# Patient Record
Sex: Female | Born: 1935
Health system: Southern US, Community
[De-identification: ages and names within clinical notes are randomized; demographics above are authoritative.]

## PROBLEM LIST (undated history)

## (undated) DIAGNOSIS — Z79899 Other long term (current) drug therapy: Secondary | ICD-10-CM

## (undated) DIAGNOSIS — W19XXXA Unspecified fall, initial encounter: Secondary | ICD-10-CM

## (undated) DIAGNOSIS — D269 Other benign neoplasm of uterus, unspecified: Secondary | ICD-10-CM

## (undated) DIAGNOSIS — H9190 Unspecified hearing loss, unspecified ear: Secondary | ICD-10-CM

## (undated) DIAGNOSIS — N182 Chronic kidney disease, stage 2 (mild): Secondary | ICD-10-CM

## (undated) DIAGNOSIS — R5383 Other fatigue: Secondary | ICD-10-CM

## (undated) DIAGNOSIS — D649 Anemia, unspecified: Secondary | ICD-10-CM

## (undated) DIAGNOSIS — D692 Other nonthrombocytopenic purpura: Secondary | ICD-10-CM

## (undated) DIAGNOSIS — J438 Other emphysema: Secondary | ICD-10-CM

## (undated) DIAGNOSIS — R05 Cough: Secondary | ICD-10-CM

## (undated) DIAGNOSIS — R7989 Other specified abnormal findings of blood chemistry: Secondary | ICD-10-CM

## (undated) DIAGNOSIS — I059 Rheumatic mitral valve disease, unspecified: Secondary | ICD-10-CM

## (undated) DIAGNOSIS — R9431 Abnormal electrocardiogram [ECG] [EKG]: Secondary | ICD-10-CM

## (undated) DIAGNOSIS — R002 Palpitations: Secondary | ICD-10-CM

## (undated) DIAGNOSIS — J209 Acute bronchitis, unspecified: Secondary | ICD-10-CM

## (undated) DIAGNOSIS — M810 Age-related osteoporosis without current pathological fracture: Secondary | ICD-10-CM

## (undated) DIAGNOSIS — R5381 Other malaise: Secondary | ICD-10-CM

## (undated) DIAGNOSIS — H259 Unspecified age-related cataract: Secondary | ICD-10-CM

## (undated) DIAGNOSIS — H6120 Impacted cerumen, unspecified ear: Secondary | ICD-10-CM

## (undated) DIAGNOSIS — I10 Essential (primary) hypertension: Secondary | ICD-10-CM

## (undated) DIAGNOSIS — R42 Dizziness and giddiness: Secondary | ICD-10-CM

## (undated) DIAGNOSIS — M6740 Ganglion, unspecified site: Secondary | ICD-10-CM

## (undated) DIAGNOSIS — F0280 Dementia in other diseases classified elsewhere without behavioral disturbance: Secondary | ICD-10-CM

## (undated) DIAGNOSIS — E785 Hyperlipidemia, unspecified: Secondary | ICD-10-CM

## (undated) DIAGNOSIS — R011 Cardiac murmur, unspecified: Secondary | ICD-10-CM

## (undated) DIAGNOSIS — I259 Chronic ischemic heart disease, unspecified: Secondary | ICD-10-CM

## (undated) DIAGNOSIS — R059 Cough, unspecified: Secondary | ICD-10-CM

## (undated) DIAGNOSIS — D696 Thrombocytopenia, unspecified: Secondary | ICD-10-CM

## (undated) DIAGNOSIS — H3530 Unspecified macular degeneration: Secondary | ICD-10-CM

## (undated) HISTORY — DX: Unspecified hearing loss, unspecified ear: H91.90

## (undated) HISTORY — DX: Other nonthrombocytopenic purpura: D69.2

## (undated) HISTORY — DX: Ganglion, unspecified site: M67.40

## (undated) HISTORY — DX: Palpitations: R00.2

## (undated) HISTORY — DX: Dizziness and giddiness: R42

## (undated) HISTORY — DX: Cardiac murmur, unspecified: R01.1

## (undated) HISTORY — DX: Age-related osteoporosis without current pathological fracture: M81.0

## (undated) HISTORY — DX: Acute bronchitis, unspecified: J20.9

## (undated) HISTORY — DX: Other malaise: R53.83

## (undated) HISTORY — DX: Thrombocytopenia, unspecified: D69.6

## (undated) HISTORY — DX: Cough: R05

## (undated) HISTORY — DX: Unspecified age-related cataract: H25.9

## (undated) HISTORY — DX: Essential (primary) hypertension: I10

## (undated) HISTORY — DX: Impacted cerumen, unspecified ear: H61.20

## (undated) HISTORY — DX: Rheumatic mitral valve disease, unspecified: I05.9

## (undated) HISTORY — DX: Abnormal electrocardiogram (ECG) (EKG): R94.31

## (undated) HISTORY — DX: Anemia, unspecified: D64.9

## (undated) HISTORY — DX: Chronic kidney disease, stage 2 (mild): N18.2

## (undated) HISTORY — DX: Cough, unspecified: R05.9

## (undated) HISTORY — DX: Other specified abnormal findings of blood chemistry: R79.89

## (undated) HISTORY — DX: Dementia in other diseases classified elsewhere, unspecified severity, without behavioral disturbance, psychotic disturbance, mood disturbance, and anxiety: F02.80

## (undated) HISTORY — DX: Chronic ischemic heart disease, unspecified: I25.9

## (undated) HISTORY — DX: Hyperlipidemia, unspecified: E78.5

## (undated) HISTORY — DX: Unspecified macular degeneration: H35.30

## (undated) HISTORY — DX: Other malaise: R53.81

## (undated) HISTORY — PX: KNEE ARTHROSCOPY: SUR90

## (undated) HISTORY — DX: Unspecified fall, initial encounter: W19.XXXA

## (undated) HISTORY — DX: Other benign neoplasm of uterus, unspecified: D26.9

## (undated) HISTORY — DX: Other long term (current) drug therapy: Z79.899

## (undated) HISTORY — DX: Other emphysema: J43.8

---

## 1998-03-19 ENCOUNTER — Other Ambulatory Visit: Admission: RE | Admit: 1998-03-19 | Discharge: 1998-03-19 | Payer: Self-pay | Admitting: Obstetrics and Gynecology

## 1999-03-25 ENCOUNTER — Other Ambulatory Visit: Admission: RE | Admit: 1999-03-25 | Discharge: 1999-03-25 | Payer: Self-pay | Admitting: Obstetrics and Gynecology

## 2000-03-26 ENCOUNTER — Other Ambulatory Visit: Admission: RE | Admit: 2000-03-26 | Discharge: 2000-03-26 | Payer: Self-pay | Admitting: Obstetrics and Gynecology

## 2001-03-29 ENCOUNTER — Other Ambulatory Visit: Admission: RE | Admit: 2001-03-29 | Discharge: 2001-03-29 | Payer: Self-pay | Admitting: Obstetrics and Gynecology

## 2005-06-27 ENCOUNTER — Emergency Department (HOSPITAL_COMMUNITY): Admission: EM | Admit: 2005-06-27 | Discharge: 2005-06-27 | Payer: Self-pay | Admitting: Family Medicine

## 2008-10-10 ENCOUNTER — Encounter: Admission: RE | Admit: 2008-10-10 | Discharge: 2008-10-10 | Payer: Self-pay | Admitting: Internal Medicine

## 2010-02-12 ENCOUNTER — Encounter
Admission: RE | Admit: 2010-02-12 | Discharge: 2010-02-12 | Payer: Self-pay | Source: Home / Self Care | Attending: Internal Medicine | Admitting: Internal Medicine

## 2010-07-23 ENCOUNTER — Other Ambulatory Visit: Payer: Self-pay | Admitting: Internal Medicine

## 2010-07-23 ENCOUNTER — Ambulatory Visit
Admission: RE | Admit: 2010-07-23 | Discharge: 2010-07-23 | Disposition: A | Discharge status: Home / Self Care | Payer: BC Managed Care – PPO | Source: Ambulatory Visit | Attending: Internal Medicine | Admitting: Internal Medicine

## 2010-07-23 DIAGNOSIS — W19XXXA Unspecified fall, initial encounter: Secondary | ICD-10-CM

## 2010-07-23 DIAGNOSIS — R609 Edema, unspecified: Secondary | ICD-10-CM

## 2010-08-07 ENCOUNTER — Encounter (INDEPENDENT_AMBULATORY_CARE_PROVIDER_SITE_OTHER): Payer: Self-pay | Admitting: General Surgery

## 2010-08-07 ENCOUNTER — Ambulatory Visit (INDEPENDENT_AMBULATORY_CARE_PROVIDER_SITE_OTHER): Payer: BC Managed Care – PPO | Admitting: General Surgery

## 2010-08-07 VITALS — BP 160/98 | HR 72 | Temp 96.5°F | Ht 65.0 in | Wt 108.6 lb

## 2010-08-07 DIAGNOSIS — S5010XA Contusion of unspecified forearm, initial encounter: Secondary | ICD-10-CM | POA: Insufficient documentation

## 2010-08-07 NOTE — Progress Notes (Signed)
Subjective:     Patient ID: Lindsey Salazar, female   DOB: 1932/09/01, 77 y.o.   MRN: [id number]  HPI Patient is a 77 year old female who fell and struck her right arm and some mild surrounding 2 weeks ago. It did break the skin but did not have any significant wound. She developed a very large hematoma on the arm. It has been getting smaller but over the last few days has stalled. It has become softer. It does not hurt. She has had no fevers or chills. She did not strike any other location on her body. She has taken a little bit easier but has still been doing moderate housework.  Review of Systems  All other systems reviewed and are negative.   Past Medical History  Diagnosis Date  . Hyperlipidemia   . Cough   . Wears glasses   . Hearing loss     Past Surgical History  Procedure Date  . Knee arthroscopy     right    Current outpatient prescriptions:aspirin 81 MG tablet, Take 81 mg by mouth daily.  , Disp: , Rfl: ;  atorvastatin (LIPITOR) 10 MG tablet, Take 10 mg by mouth daily.  , Disp: , Rfl: ;  CALCIUM-VITAMIN D PO, Take by mouth 2 (two) times daily.  , Disp: , Rfl: ;  Multiple Vitamin (MULTIVITAMIN) capsule, Take 1 capsule by mouth daily.  , Disp: , Rfl: ;  Multiple Vitamins-Minerals (OCUVITE PO), Take by mouth daily.  , Disp: , Rfl:  Omega-3 Fatty Acids (FISH OIL PO), Take 1,200 mg by mouth 2 (two) times daily.  , Disp: , Rfl: ;  Polyethyl Glycol-Propyl Glycol (SYSTANE OP), Apply to eye daily.  , Disp: , Rfl:     Allergies  Allergen Reactions  . Penicillins     Family History  Problem Relation Age of Onset  . Stroke Brother   . Cancer Sister     lung    History   Social History  . Marital Status: Single    Spouse Name: N/A    Number of Children: N/A  . Years of Education: N/A   Occupational History  . Not on file.   Social History Main Topics  . Smoking status: Never Smoker   . Smokeless tobacco: Not on file  . Alcohol Use: No  . Drug Use: No  .  Sexually Active:    Other Topics Concern  . Not on file   Social History Narrative  . No narrative on file        Objective:   Physical Exam  Constitutional: She is oriented to person, place, and time. She appears well-developed and well-nourished. No distress.  HENT:  Head: Normocephalic.  Eyes: Conjunctivae are normal. Pupils are equal, round, and reactive to light. No scleral icterus.  Neck: Normal range of motion.  Cardiovascular: Normal rate.   Pulmonary/Chest: Effort normal. No respiratory distress.  Abdominal: Soft. There is no tenderness.  Musculoskeletal: Normal range of motion. She exhibits no tenderness.       R forearm hematoma around 5 cm over proximal brachioradialis.  Soft.  No erythema.  Neurological: She is alert and oriented to person, place, and time. Coordination normal.  Skin: Skin is warm and dry. No rash noted. She is not diaphoretic. No erythema. No pallor.  Psychiatric: She has a normal mood and affect. Her behavior is normal. Judgment and thought content normal.       Assessment:     R forearm hematoma  Plan:     Ice pack BID Wrap loosely with Ace bandage. Hold Aspirin and fish oil for 1-2 weeks. Expect hematoma to resolve on its own or to rupture spontaneously. Would not I&D as not infected, and open wound would likely take as long to heal as the hematoma. Will follow up as needed.

## 2010-11-01 LAB — HM DEXA SCAN

## 2010-11-08 ENCOUNTER — Encounter: Payer: Self-pay | Admitting: Cardiology

## 2011-03-19 ENCOUNTER — Encounter: Payer: Self-pay | Admitting: Cardiology

## 2011-04-15 ENCOUNTER — Ambulatory Visit (INDEPENDENT_AMBULATORY_CARE_PROVIDER_SITE_OTHER): Payer: BC Managed Care – PPO | Admitting: Cardiology

## 2011-04-15 ENCOUNTER — Encounter: Payer: Self-pay | Admitting: Cardiology

## 2011-04-15 VITALS — BP 165/100 | HR 66 | Ht 64.0 in | Wt 111.0 lb

## 2011-04-15 DIAGNOSIS — E785 Hyperlipidemia, unspecified: Secondary | ICD-10-CM

## 2011-04-15 DIAGNOSIS — I10 Essential (primary) hypertension: Secondary | ICD-10-CM

## 2011-04-15 DIAGNOSIS — R9431 Abnormal electrocardiogram [ECG] [EKG]: Secondary | ICD-10-CM

## 2011-04-15 LAB — BASIC METABOLIC PANEL
CO2: 26 mEq/L (ref 19–32)
Calcium: 9.8 mg/dL (ref 8.4–10.5)
Chloride: 103 mEq/L (ref 96–112)
Creatinine, Ser: 1.4 mg/dL — ABNORMAL HIGH (ref 0.4–1.2)
Potassium: 4.5 mEq/L (ref 3.5–5.1)
Sodium: 138 mEq/L (ref 135–145)

## 2011-04-15 LAB — CALCIUM: Calcium: 9.8 mg/dL (ref 8.4–10.5)

## 2011-04-15 NOTE — Assessment & Plan Note (Signed)
Her blood pressure is elevated today but she says this is very unusual. I repeated and verified this. She will keep her blood pressure diary which I reviewed. She will bring her blood pressure cuff to our office to compare readings. Further adjustments will be based on these results.

## 2011-04-15 NOTE — Progress Notes (Signed)
   HPI The patient presents for evaluation of an abnormal EKG. She has no prior cardiac history other than some vague chest discomfort years ago. She reports having had a stress test. She was well until late February when she had a fall in her blood pressure that was unexplained. This persisted for several days. She was weak and presyncopal. She did not describe associated chest discomfort, neck or arm discomfort. She did have some shortness of breath with activity but she wasn't having PND or orthopnea. Her Toprol was discontinued. She was noted at that time to have deep T-wave inversions in the anterolateral and inferior leads which was different compared to EKG in October of last year. She was set up for this appointment. However, her symptoms subsequently abated. She has since felt well. Her blood pressure has returned to. She denies any ongoing symptoms. The patient denies any new symptoms such as chest discomfort, neck or arm discomfort. There has been no new shortness of breath, PND or orthopnea. There have been no reported palpitations, presyncope or syncope.   Allergies  Allergen Reactions  . Penicillins     Current Outpatient Prescriptions  Medication Sig Dispense Refill  . atorvastatin (LIPITOR) 10 MG tablet Take 10 mg by mouth daily.        Marland Kitchen CALCIUM-VITAMIN D PO Take by mouth 2 (two) times daily.        . Multiple Vitamin (MULTIVITAMIN) capsule Take 1 capsule by mouth daily.        . Omega-3 Fatty Acids (FISH OIL PO) Take 1,200 mg by mouth 2 (two) times daily.        Marland Kitchen aspirin 81 MG tablet Take 81 mg by mouth daily.        Vladimir Faster Glycol-Propyl Glycol (SYSTANE OP) Apply to eye daily.          Past Medical History  Diagnosis Date  . Hyperlipidemia   . Cough   . Wears glasses   . Hearing loss     Past Surgical History  Procedure Date  . Knee arthroscopy     right    ROS:  As stated in the HPI and negative for all other systems.  PHYSICAL EXAM BP 165/100  Pulse 66   Ht 5\' 4"  (1.626 m)  Wt 111 lb (50.349 kg)  BMI 19.05 kg/m2 GENERAL:  Well appearing HEENT:  Pupils equal round and reactive, fundi not visualized, oral mucosa unremarkable NECK:  No jugular venous distention, waveform within normal limits, carotid upstroke brisk and symmetric, no bruits, no thyromegaly LYMPHATICS:  No cervical, inguinal adenopathy LUNGS:  Clear to auscultation bilaterally BACK:  No CVA tenderness CHEST:  Unremarkable HEART:  PMI not displaced or sustained,S1 and S2 within normal limits, no S3, no S4, no clicks, no rubs, no murmurs ABD:  Flat, positive bowel sounds normal in frequency in pitch, no bruits, no rebound, no guarding, no midline pulsatile mass, no hepatomegaly, no splenomegaly EXT:  2 plus pulses throughout, no edema, no cyanosis no clubbing SKIN:  No rashes no nodules NEURO:  Cranial nerves II through XII grossly intact, motor grossly intact throughout [facility]:  Cognitively intact, oriented to person place and time  EKG:  Sinus rhythm, rate 66, axis within normal limits, intervals within normal limits, deep T-wave inversions in the in [place] or leads II, III aVF and leads V3 through V6. 04/15/2011  ASSESSMENT AND PLAN

## 2011-04-15 NOTE — Assessment & Plan Note (Signed)
The patient has a markedly abnormal although she feels well.  I will check an echocardiogram to begin with.  Pending these results I will I on further ischemia workup.  I will check a magnesium, TSH, calcium and a basic metabolic profile.

## 2011-04-15 NOTE — Patient Instructions (Addendum)
Please have blood work today (MG, TSH, CALCIUM AND BMP)  The current medical regimen is effective;  continue present plan and medications.  Your physician has requested that you have an echocardiogram. Echocardiography is a painless test that uses sound waves to create images of your heart. It provides your doctor with information about the size and shape of your heart and how well your heart's chambers and valves are working. This procedure takes approximately one hour. There are no restrictions for this procedure.  Follow up in 2 months with Dr Percival Spanish.

## 2011-04-16 ENCOUNTER — Telehealth: Payer: Self-pay | Admitting: Cardiology

## 2011-04-16 NOTE — Telephone Encounter (Signed)
New Problem:    Patient called in wanting to know if she should start back on her blood pressure medication that she has at home.  Please call back.  She will leave her house around 10:30am and won't return until 2:00pm.

## 2011-04-16 NOTE — Telephone Encounter (Signed)
Left message for pt to keep BP diary and to not restart any medications at this time.  Requested she call back with further questions

## 2011-04-16 NOTE — Telephone Encounter (Signed)
Pt reported she was not taking BP medication at this time.  Dr Hochrein's orders were for her to keep a blood pressure diary and he will make adjustments based on these.  She is also to schedule a BP check and bring her BP cuff from home to compare.

## 2011-04-28 ENCOUNTER — Other Ambulatory Visit (HOSPITAL_COMMUNITY): Payer: BC Managed Care – PPO

## 2011-05-01 ENCOUNTER — Other Ambulatory Visit: Payer: Self-pay

## 2011-05-01 ENCOUNTER — Ambulatory Visit (HOSPITAL_COMMUNITY): Payer: MEDICARE | Attending: Cardiology

## 2011-05-01 DIAGNOSIS — I059 Rheumatic mitral valve disease, unspecified: Secondary | ICD-10-CM | POA: Insufficient documentation

## 2011-05-01 DIAGNOSIS — E785 Hyperlipidemia, unspecified: Secondary | ICD-10-CM | POA: Insufficient documentation

## 2011-05-01 DIAGNOSIS — I10 Essential (primary) hypertension: Secondary | ICD-10-CM | POA: Insufficient documentation

## 2011-05-01 DIAGNOSIS — R9431 Abnormal electrocardiogram [ECG] [EKG]: Secondary | ICD-10-CM | POA: Insufficient documentation

## 2011-05-13 ENCOUNTER — Other Ambulatory Visit: Payer: Self-pay | Admitting: *Deleted

## 2011-05-13 DIAGNOSIS — R9439 Abnormal result of other cardiovascular function study: Secondary | ICD-10-CM

## 2011-05-22 ENCOUNTER — Ambulatory Visit (HOSPITAL_COMMUNITY): Payer: MEDICARE | Attending: Cardiology | Admitting: Radiology

## 2011-05-22 VITALS — BP 153/88 | Ht 65.0 in | Wt 107.0 lb

## 2011-05-22 DIAGNOSIS — R000 Tachycardia, unspecified: Secondary | ICD-10-CM | POA: Insufficient documentation

## 2011-05-22 DIAGNOSIS — R9439 Abnormal result of other cardiovascular function study: Secondary | ICD-10-CM

## 2011-05-22 DIAGNOSIS — R0609 Other forms of dyspnea: Secondary | ICD-10-CM | POA: Insufficient documentation

## 2011-05-22 DIAGNOSIS — E785 Hyperlipidemia, unspecified: Secondary | ICD-10-CM | POA: Insufficient documentation

## 2011-05-22 DIAGNOSIS — R0989 Other specified symptoms and signs involving the circulatory and respiratory systems: Secondary | ICD-10-CM | POA: Insufficient documentation

## 2011-05-22 DIAGNOSIS — R9431 Abnormal electrocardiogram [ECG] [EKG]: Secondary | ICD-10-CM

## 2011-05-22 DIAGNOSIS — R0602 Shortness of breath: Secondary | ICD-10-CM

## 2011-05-22 MED ORDER — REGADENOSON 0.4 MG/5ML IV SOLN
0.4000 mg | Freq: Once | INTRAVENOUS | Status: AC
  Administered 2011-05-22: 0.4 mg via INTRAVENOUS

## 2011-05-22 MED ORDER — TECHNETIUM TC 99M TETROFOSMIN IV KIT
33.0000 | PACK | Freq: Once | INTRAVENOUS | Status: AC | PRN
  Administered 2011-05-22: 33 via INTRAVENOUS

## 2011-05-22 MED ORDER — TECHNETIUM TC 99M TETROFOSMIN IV KIT
11.0000 | PACK | Freq: Once | INTRAVENOUS | Status: AC | PRN
  Administered 2011-05-22: 11 via INTRAVENOUS

## 2011-05-22 NOTE — Progress Notes (Signed)
[place] [id number] [phone]  Cardiology Nuclear Med Study  Jeraldin Apple Lindsey Salazar is a 78 y.o. female     MRN : [id number]     DOB: 20-Jun-1932  Procedure Date: 05/22/2011  Nuclear Med Background Indication for Stress Test:  Evaluation for Ischemia and Abnormal EKG History: 4/13  Echo EF 55-60% Cardiac Risk Factors: Lipids  Symptoms:  DOE and Rapid HR   Nuclear Pre-Procedure Caffeine/Decaff Intake:  None> 12 hrs NPO After: 5:00pm   Lungs:  clear O2 Sat: 98% on room air. IV 0.9% NS with Angio Cath:  20g  IV Site: R Antecubital x 1, tolerated well IV Started by:  Irven Baltimore, RN  Chest Size (in):  34 Cup Size: A  Height: 5\' 5"  (1.651 m)  Weight:  107 lb (48.535 kg)  BMI:  Body mass index is 17.81 kg/(m^2). Tech Comments:  n/a    Nuclear Med Study 1 or 2 day study: 1 day  Stress Test Type:  Lexiscan  Reading MD: Darlin Coco, MD  Order Authorizing Provider:  Minus Breeding, MD  Resting Radionuclide: Technetium 54m Tetrofosmin  Resting Radionuclide Dose: 11.0 mCi   Stress Radionuclide:  Technetium 29m Tetrofosmin  Stress Radionuclide Dose: 33.0 mCi           Stress Protocol Rest HR: 58 Stress HR: 86  Rest BP: 153/88 Stress BP: 156/98  Exercise Time (min): n/a METS: n/a   Predicted Max HR: 144 bpm % Max HR: 59.72 bpm Rate Pressure Product: 13416   Dose of Adenosine (mg):  n/a Dose of Lexiscan: 0.4 mg  Dose of Atropine (mg): n/a Dose of Dobutamine: n/a mcg/kg/min (at max HR)  Stress Test Technologist: Crissie Figures, RN  Nuclear Technologist:  Charlton Amor, CNMT     Rest Procedure:  Myocardial perfusion imaging was performed at rest 45 minutes following the intravenous administration of Technetium 69m Tetrofosmin. Rest ECG: Sinus Bradycardia  Stress Procedure:  The patient received IV Lexiscan 0.4 mg over 15-seconds.  Technetium 72m Tetrofosmin injected at 30-seconds.  There were no significant  changes with Lexiscan.  Quantitative spect images were obtained after a 45 minute delay. Stress ECG: No significant change from baseline ECG  QPS Raw Data Images:  Normal; no motion artifact; normal heart/lung ratio. Stress Images:  Normal homogeneous uptake in all areas of the myocardium. Rest Images:  Normal homogeneous uptake in all areas of the myocardium. Subtraction (SDS):  No evidence of ischemia. Transient Ischemic Dilatation (Normal <1.22):  1.13 Lung/Heart Ratio (Normal <0.45):  0.26  Quantitative Gated Spect Images QGS EDV:  49 ml QGS ESV:  11 ml  Impression Exercise Capacity:  Lexiscan with no exercise. BP Response:  Normal blood pressure response. Clinical Symptoms:  No symptoms. ECG Impression:  No significant ST segment change suggestive of ischemia. Comparison with Prior Nuclear Study: No images to compare  Overall Impression:  Normal stress nuclear study.  LV Ejection Fraction: 77%.  LV Wall Motion:  NL LV Function; NL Wall Motion  [employer]

## 2011-06-19 ENCOUNTER — Ambulatory Visit: Payer: BC Managed Care – PPO | Admitting: Cardiology

## 2012-04-06 ENCOUNTER — Encounter: Payer: Self-pay | Admitting: Nurse Practitioner

## 2012-04-06 ENCOUNTER — Ambulatory Visit
Admission: RE | Admit: 2012-04-06 | Discharge: 2012-04-06 | Disposition: A | Discharge status: Home / Self Care | Payer: BC Managed Care – PPO | Source: Ambulatory Visit | Attending: Nurse Practitioner | Admitting: Nurse Practitioner

## 2012-04-06 ENCOUNTER — Ambulatory Visit: Payer: Self-pay | Admitting: Nurse Practitioner

## 2012-04-06 ENCOUNTER — Ambulatory Visit (INDEPENDENT_AMBULATORY_CARE_PROVIDER_SITE_OTHER): Payer: BC Managed Care – PPO | Admitting: Nurse Practitioner

## 2012-04-06 VITALS — BP 138/84 | HR 52 | Temp 98.3°F | Ht 64.5 in | Wt 104.2 lb

## 2012-04-06 DIAGNOSIS — J209 Acute bronchitis, unspecified: Secondary | ICD-10-CM

## 2012-04-06 MED ORDER — ALBUTEROL SULFATE HFA 108 (90 BASE) MCG/ACT IN AERS: 2.0000 | INHALATION_SPRAY | Freq: Four times a day (QID) | RESPIRATORY_TRACT | Status: DC | PRN

## 2012-04-06 NOTE — Progress Notes (Signed)
  Subjective:    Patient ID: Lindsey Salazar, female    DOB: Jul 04, 1932, 79 y.o.   MRN: [id number]  HPI 79 year old female here to follow up on respiratory infection. Took full course of doxycyline and mucinex DM. Still feels congestion-- today it is some better but she is still coughing  Reports good apetitie but still with decreased energy 101.1 4 days ago however it has been normal since then.   Review of Systems  Constitutional: Positive for fatigue. Negative for fever, chills, activity change and appetite change.  HENT: Positive for rhinorrhea. Negative for congestion.   Respiratory: Positive for cough (productive ). Negative for chest tightness and shortness of breath.   Gastrointestinal: Negative for nausea, vomiting, abdominal pain, constipation and abdominal distention.       Objective:   Physical Exam  Constitutional: No distress.  HENT:  Head: Normocephalic and atraumatic.  Eyes: Pupils are equal, round, and reactive to light.  Neck: Normal range of motion. Neck supple. No thyromegaly present.  Cardiovascular: Normal rate.   Murmur heard. Pulmonary/Chest: Effort normal. She has rales (right lower lobe).  Abdominal: Soft. Bowel sounds are normal.  Lymphadenopathy:    She has no cervical adenopathy.  Skin: Skin is warm and dry. She is not diaphoretic.  Psychiatric: She has a normal mood and affect.          Assessment & Plan:  Bronchitis- cont mucinex DM 1 tablet every 12 hours as needed for cough and congestion Increase fluid intake.  Will get chest xray due to ongoing symptoms and fever over the weekend.  Will also give albuterol inhaler to use as needed.

## 2012-04-06 NOTE — Patient Instructions (Signed)

## 2012-05-13 ENCOUNTER — Other Ambulatory Visit: Payer: Self-pay | Admitting: *Deleted

## 2012-05-13 ENCOUNTER — Other Ambulatory Visit: Payer: BC Managed Care – PPO

## 2012-05-13 DIAGNOSIS — E785 Hyperlipidemia, unspecified: Secondary | ICD-10-CM

## 2012-05-13 NOTE — Addendum Note (Signed)
Addended by: Raphael Gibney A on: 05/13/2012 08:28 AM   Modules accepted: Orders

## 2012-05-14 ENCOUNTER — Other Ambulatory Visit: Payer: BC Managed Care – PPO

## 2012-05-14 LAB — BASIC METABOLIC PANEL
BUN: 18 mg/dL (ref 8–27)
CO2: 27 mmol/L (ref 19–28)
Calcium: 9.5 mg/dL (ref 8.6–10.2)
Creatinine, Ser: 1.38 mg/dL — ABNORMAL HIGH (ref 0.57–1.00)
GFR calc Af Amer: 43 mL/min/{1.73_m2} — ABNORMAL LOW (ref >59)
GFR calc non Af Amer: 37 mL/min/{1.73_m2} — ABNORMAL LOW (ref >59)
Glucose: 81 mg/dL (ref 65–99)
Potassium: 4.6 mmol/L (ref 3.5–5.2)

## 2012-05-14 LAB — CBC WITH DIFFERENTIAL/PLATELET
Basos: 1 % (ref 0–3)
Hemoglobin: 12.6 g/dL (ref 11.1–15.9)
Immature Grans (Abs): 0 10*3/uL (ref 0.0–0.1)
Immature Granulocytes: 0 % (ref 0–2)
MCV: 93 fL (ref 79–97)
Monocytes Absolute: 0.5 10*3/uL (ref 0.1–0.9)
Monocytes: 9 % (ref 4–12)
WBC: 5.4 10*3/uL (ref 3.4–10.8)

## 2012-05-18 ENCOUNTER — Encounter: Payer: Self-pay | Admitting: Geriatric Medicine

## 2012-05-18 ENCOUNTER — Ambulatory Visit (INDEPENDENT_AMBULATORY_CARE_PROVIDER_SITE_OTHER): Payer: BC Managed Care – PPO | Admitting: Internal Medicine

## 2012-05-18 ENCOUNTER — Encounter: Payer: Self-pay | Admitting: Internal Medicine

## 2012-05-18 VITALS — BP 160/90 | HR 55 | Temp 98.0°F | Resp 16 | Ht 64.5 in | Wt 103.0 lb

## 2012-05-18 DIAGNOSIS — H259 Unspecified age-related cataract: Secondary | ICD-10-CM | POA: Insufficient documentation

## 2012-05-18 DIAGNOSIS — N181 Chronic kidney disease, stage 1: Secondary | ICD-10-CM

## 2012-05-18 DIAGNOSIS — N182 Chronic kidney disease, stage 2 (mild): Secondary | ICD-10-CM

## 2012-05-18 DIAGNOSIS — I10 Essential (primary) hypertension: Secondary | ICD-10-CM

## 2012-05-18 DIAGNOSIS — D696 Thrombocytopenia, unspecified: Secondary | ICD-10-CM

## 2012-05-18 DIAGNOSIS — I059 Rheumatic mitral valve disease, unspecified: Secondary | ICD-10-CM | POA: Insufficient documentation

## 2012-05-18 DIAGNOSIS — E785 Hyperlipidemia, unspecified: Secondary | ICD-10-CM

## 2012-05-18 DIAGNOSIS — J209 Acute bronchitis, unspecified: Secondary | ICD-10-CM

## 2012-05-18 DIAGNOSIS — R7989 Other specified abnormal findings of blood chemistry: Secondary | ICD-10-CM

## 2012-05-18 NOTE — Progress Notes (Signed)
Subjective:    Patient ID: Lindsey Salazar, female    DOB: 1932/11/08, 79 y.o.   MRN: [id number]  HPI Bruising on forearms related to use of ASA.  Lightheaded feelings have resolved. Still has some dry cough. Patient was last seen 03/23/12 with a cough, sputum, and congestion in her chest. The dark green/yellow thick mucus has resolved. She has no more temperature. She has finished her doxycycline.  Home BP variable: SBP 129-173, DBP 58-80.     Review of Systems  Constitutional: Positive for fatigue. Negative for fever, chills, activity change and appetite change.  HENT: Negative for congestion and rhinorrhea.   Respiratory: Positive for cough (productive ). Negative for chest tightness and shortness of breath.   Cardiovascular: Negative.   Gastrointestinal: Negative.  Negative for nausea, vomiting, abdominal pain, constipation and abdominal distention.  Endocrine: Negative.   Musculoskeletal: Positive for gait problem.  Skin:       Complains of easy bruising.  Neurological: Negative.        Complaint of some balance issues.a  Psychiatric/Behavioral: Positive for sleep disturbance.       Wakes up after about an hour of sleep and is awake for another 3 hours.       Objective:   Physical Exam  Constitutional: No distress.  HENT:  Head: Normocephalic and atraumatic.  Eyes: Pupils are equal, round, and reactive to light.  Neck: Normal range of motion. Neck supple. No thyromegaly present.  Cardiovascular: Normal rate.   Murmur heard. Pulmonary/Chest: Effort normal and breath sounds normal. Rales: right lower lobe.  Abdominal: Soft. Bowel sounds are normal.  Lymphadenopathy:    She has no cervical adenopathy.  Skin: Skin is warm and dry. She is not diaphoretic.  Psychiatric: She has a normal mood and affect.   Lab reports:    BMET    Component Value Date/Time   NA 140 05/13/2012 0851   NA 138 04/15/2011 1222   K 4.6 05/13/2012 0851   CL 103 05/13/2012 0851   CO2 27  05/13/2012 0851   GLUCOSE 81 05/13/2012 0851   GLUCOSE 84 04/15/2011 1222   BUN 18 05/13/2012 0851   BUN 25* 04/15/2011 1222   CREATININE 1.38* 05/13/2012 0851   CALCIUM 9.5 05/13/2012 0851   GFRNONAA 37* 05/13/2012 0851   GFRAA 43* 05/13/2012 0851    CBC    Component Value Date/Time   WBC 5.4 05/13/2012 0851   RBC 3.99 05/13/2012 0851   HGB 12.6 05/13/2012 0851   HCT 37.2 05/13/2012 0851   MCV 93 05/13/2012 0851   MCH 31.6 05/13/2012 0851   MCHC 33.9 05/13/2012 0851   RDW 14.1 05/13/2012 0851   LYMPHSABS 1.4 05/13/2012 0851   EOSABS 0.1 05/13/2012 0851   BASOSABS 0.0 05/13/2012 0851    Assessment & Plan:  1. Other abnormal blood chemistry  Blood sugars under control  2. Chronic kidney disease, stage II (mild)  BUN and creatinine have improved  3. Thrombocytopenia, unspecified  Platelet count does not seem to be included in the report on her CBC with differential from 05/13/12. Car with her laboratory.  4. Mitral valve disorders  Stable  5. Chronic kidney disease, stage I  Slightly improved  6. HTN (hypertension)  Stable  7. Hyperlipidemia  Stable on current medications  8. Acute bronchitis  Patient appears to be doing better. The residual cough is slightly worrisome. We have asked her to return in 2-3 months for an office visit particular followup or cough. Additional studies  may be necessary if the cough has persisted.

## 2012-05-18 NOTE — Patient Instructions (Addendum)
Continue current medications. You are due for your Complete exam after 11/17/12. You will need CBC with platelet count, CMP, EKG, Lipids.

## 2012-07-13 ENCOUNTER — Ambulatory Visit: Payer: BC Managed Care – PPO | Admitting: Internal Medicine

## 2012-11-19 ENCOUNTER — Other Ambulatory Visit: Payer: Self-pay | Admitting: *Deleted

## 2012-11-19 ENCOUNTER — Other Ambulatory Visit: Payer: BC Managed Care – PPO

## 2012-11-19 DIAGNOSIS — Z0000 Encounter for general adult medical examination without abnormal findings: Secondary | ICD-10-CM

## 2012-11-20 LAB — COMPREHENSIVE METABOLIC PANEL
ALT: 9 IU/L (ref 0–32)
Albumin/Globulin Ratio: 1.4 (ref 1.1–2.5)
Albumin: 3.8 g/dL (ref 3.5–4.8)
Alkaline Phosphatase: 75 IU/L (ref 39–117)
BUN/Creatinine Ratio: 19 (ref 11–26)
Chloride: 101 mmol/L (ref 97–108)
GFR calc Af Amer: 38 mL/min/{1.73_m2} — ABNORMAL LOW (ref >59)
GFR calc non Af Amer: 33 mL/min/{1.73_m2} — ABNORMAL LOW (ref >59)
Glucose: 77 mg/dL (ref 65–99)
Potassium: 4.4 mmol/L (ref 3.5–5.2)
Sodium: 141 mmol/L (ref 134–144)
Total Bilirubin: 0.4 mg/dL (ref 0.0–1.2)
Total Protein: 6.6 g/dL (ref 6.0–8.5)

## 2012-11-20 LAB — CBC WITH DIFFERENTIAL/PLATELET
Basophils Absolute: 0 10*3/uL (ref 0.0–0.2)
HCT: 39.8 % (ref 34.0–46.6)
Immature Grans (Abs): 0 10*3/uL (ref 0.0–0.1)
Immature Granulocytes: 0 %
Lymphocytes Absolute: 1.3 10*3/uL (ref 0.7–3.1)
MCHC: 33.2 g/dL (ref 31.5–35.7)
MCV: 91 fL (ref 79–97)
Monocytes Absolute: 0.4 10*3/uL (ref 0.1–0.9)
Neutrophils Absolute: 2.9 10*3/uL (ref 1.4–7.0)
Neutrophils Relative %: 61 %
RBC: 4.38 x10E6/uL (ref 3.77–5.28)
RDW: 13.7 % (ref 12.3–15.4)

## 2012-11-20 LAB — LIPID PANEL
Chol/HDL Ratio: 2.6 ratio units (ref 0.0–4.4)
Cholesterol, Total: 170 mg/dL (ref 100–199)
HDL: 66 mg/dL (ref >39)
VLDL Cholesterol Cal: 12 mg/dL (ref 5–40)

## 2012-11-23 ENCOUNTER — Ambulatory Visit (INDEPENDENT_AMBULATORY_CARE_PROVIDER_SITE_OTHER): Payer: BC Managed Care – PPO | Admitting: Internal Medicine

## 2012-11-23 ENCOUNTER — Encounter: Payer: Self-pay | Admitting: Internal Medicine

## 2012-11-23 VITALS — BP 142/96 | HR 50 | Resp 12 | Ht 65.0 in | Wt 101.6 lb

## 2012-11-23 DIAGNOSIS — I10 Essential (primary) hypertension: Secondary | ICD-10-CM

## 2012-11-23 DIAGNOSIS — N182 Chronic kidney disease, stage 2 (mild): Secondary | ICD-10-CM

## 2012-11-23 DIAGNOSIS — R9431 Abnormal electrocardiogram [ECG] [EKG]: Secondary | ICD-10-CM

## 2012-11-23 DIAGNOSIS — R739 Hyperglycemia, unspecified: Secondary | ICD-10-CM | POA: Insufficient documentation

## 2012-11-23 DIAGNOSIS — R7309 Other abnormal glucose: Secondary | ICD-10-CM

## 2012-11-23 DIAGNOSIS — D696 Thrombocytopenia, unspecified: Secondary | ICD-10-CM

## 2012-11-23 DIAGNOSIS — Z23 Encounter for immunization: Secondary | ICD-10-CM

## 2012-11-23 MED ORDER — TETANUS-DIPHTH-ACELL PERTUSSIS 5-2.5-18.5 LF-MCG/0.5 IM SUSP: 0.5000 mL | Freq: Once | INTRAMUSCULAR | Status: DC

## 2012-11-23 NOTE — Progress Notes (Signed)
Subjective:    Patient ID: Lindsey Salazar, female    DOB: 01-24-1932, 79 y.o.   MRN: [id number]  Chief Complaint  Patient presents with  . Annual Exam    Yearly exam, discuss labs (copy printed)  . Advice    Patient questions if she needs to continue to f/u with Cardiology yearly     HPI HTN (hypertension)  Need for Streptococcus pneumoniae vaccination - Plan: Pneumococcal polysaccharide vaccine 23-valent greater than or equal to 2yo subcutaneous/IM  Need for prophylactic vaccination and inoculation against influenza  Hyperglycemia: normal on recent lab  Chronic kidney disease, stage II (mild)  Abnormal EKG  Thrombocytopenia, unspecified    Past Medical History  Diagnosis Date  . Hyperlipidemia   . Cough   . Hearing loss   . Acute bronchitis   . Thrombocytopenia, unspecified   . Macular degeneration (senile) of retina, unspecified   . Senile cataract, unspecified   . Impacted cerumen   . Chronic kidney disease, stage II (mild)   . Nonspecific abnormal electrocardiogram (ECG) (EKG)   . Other abnormal blood chemistry   . Hypertension   . Other emphysema   . Nonspecific abnormal electrocardiogram (ECG) (EKG)   . Encounter for long-term (current) use of other medications   . Mitral valve disorders   . Osteoporosis, unspecified   . Chronic ischemic heart disease, unspecified   . Other malaise and fatigue   . Unspecified hearing loss   . Ganglion, unspecified   . Benign neoplasm of uterus, part unspecified   . Palpitations    Past Surgical History  Procedure Laterality Date  . Knee arthroscopy      right   Family Status  Relation Status Death Age  . Brother Deceased   . Sister Deceased   . Father Deceased     parkinsons, demntia  . Mother Deceased   . Sister Alive   . Daughter Alive   . Son Alive   . Son Alive   . Son Alive    Family History  Problem Relation Age of Onset  . Stroke Brother   . Cancer Sister     lung  . Parkinsonism  Father    History   Social History  . Marital Status: Single    Spouse Name: N/A    Number of Children: 4  . Years of Education: N/A   Occupational History  .     Social History Main Topics  . Smoking status: Never Smoker   . Smokeless tobacco: None  . Alcohol Use: No  . Drug Use: No  . Sexual Activity: None   Other Topics Concern  . None   Social History Narrative   Lives with husband.      Current Outpatient Prescriptions on File Prior to Visit  Medication Sig Dispense Refill  . acetaminophen (TYLENOL) 500 MG tablet Take 500 mg by mouth every 6 (six) hours as needed.      Marland Kitchen aspirin 81 MG tablet Take 81 mg by mouth daily.        Marland Kitchen atorvastatin (LIPITOR) 10 MG tablet Take 10 mg by mouth daily.        Marland Kitchen CALCIUM-VITAMIN D PO Take by mouth 2 (two) times daily.        . Multiple Vitamin (MULTIVITAMIN) capsule Take 1 capsule by mouth daily.        . NON FORMULARY 2 (two) times daily. occu support tabs bid      . Omega-3 Fatty Acids (FISH  OIL PO) Take 1,200 mg by mouth 2 (two) times daily.        Vladimir Faster Glycol-Propyl Glycol (SYSTANE OP) Apply to eye daily.         No current facility-administered medications on file prior to visit.   Allergies  Allergen Reactions  . Penicillins    Immunization History  Administered Date(s) Administered  . Influenza,inj,Quad PF,36+ Mos 11/23/2012  . Influenza-Generic 10/05/2009, 10/25/2010, 10/13/2011  . Pneumococcal Polysaccharide 11/23/2012  . Pneumococcal-Generic 01/21/1995  . Td 08/20/1992  . Zoster 02/11/2006     Review of Systems  Constitutional: Positive for fatigue. Negative for fever, chills, activity change and appetite change.  HENT: Negative for congestion and rhinorrhea.   Eyes: Negative.   Respiratory: Positive for cough (productive ). Negative for chest tightness and shortness of breath.   Cardiovascular: Negative.   Gastrointestinal: Negative.  Negative for nausea, vomiting, abdominal pain, constipation and  abdominal distention.  Endocrine: Negative.   Musculoskeletal: Positive for gait problem.  Skin: Negative.        Complains of easy bruising.  Neurological: Negative.        Complaint of some balance issues.a  Hematological: Negative.   Psychiatric/Behavioral: Positive for sleep disturbance.       Wakes up after about an hour of sleep and is awake for another 3 hours.       Objective:BP 142/96  Pulse 50  Resp 12  Ht 5\' 5"  (1.651 m)  Wt 101 lb 9.6 oz (46.085 kg)  BMI 16.91 kg/m2  SpO2 90%    Physical Exam  Constitutional: She is oriented to person, place, and time. She appears well-developed and well-nourished. No distress.  HENT:  Head: Normocephalic and atraumatic.  Right Ear: External ear normal.  Left Ear: External ear normal.  Nose: Nose normal.  Mouth/Throat: Oropharynx is clear and moist.  Eyes: Conjunctivae and EOM are normal. Pupils are equal, round, and reactive to light.  Corrective lenses.  Neck: Normal range of motion. Neck supple. No JVD present. No tracheal deviation present. No thyromegaly present.  Cardiovascular: Normal rate and regular rhythm.   Murmur heard. Pulmonary/Chest: Effort normal. No respiratory distress. She has no wheezes. She has rales (right lower lobe). She exhibits no tenderness.  Abdominal: Soft. Bowel sounds are normal. She exhibits no distension and no mass. There is no tenderness.  Genitourinary: Vagina normal and uterus normal. Guaiac negative stool. No vaginal discharge found.  Musculoskeletal: Normal range of motion. She exhibits no edema and no tenderness.  Lymphadenopathy:    She has no cervical adenopathy.  Neurological: She is alert and oriented to person, place, and time. She has normal reflexes. No cranial nerve deficit. Coordination normal.  Intact vibratory  Skin: Skin is warm and dry. She is not diaphoretic. No erythema. No pallor.  Psychiatric: She has a normal mood and affect. Her behavior is normal. Judgment and thought  content normal.      Office Visit on 11/23/2012  Component Date Value Range Status  . HM Mammogram 11/03/2011 Normal   Final  . HM Colonoscopy 08/20/2004 Normal-Dr.Medoff   Final  . HM Dexa Scan 11/01/2010 F/u in 2 years, osteopenia   Final  Appointment on 11/19/2012  Component Date Value Range Status  . Cholesterol, Total 11/19/2012 170  100 - 199 mg/dL Final  . Triglycerides 11/19/2012 61  0 - 149 mg/dL Final  . HDL 11/19/2012 66  >39 mg/dL Final   Comment: According to ATP-III Guidelines, HDL-C >59 mg/dL is considered a  negative risk factor for CHD.  Marland Kitchen VLDL Cholesterol Cal 11/19/2012 12  5 - 40 mg/dL Final  . LDL Calculated 11/19/2012 92  0 - 99 mg/dL Final  . Chol/HDL Ratio 11/19/2012 2.6  0.0 - 4.4 ratio units Final   Comment:                                   T. Chol/HDL Ratio                                                                      Men  Women                                                        1/2 Avg.Risk  3.4    3.3                                                            Avg.Risk  5.0    4.4                                                         2X Avg.Risk  9.6    7.1                                                         3X Avg.Risk 23.4   11.0  . Glucose 11/19/2012 77  65 - 99 mg/dL Final  . BUN 11/19/2012 29* 8 - 27 mg/dL Final  . Creatinine, Ser 11/19/2012 1.50* 0.57 - 1.00 mg/dL Final  . GFR calc non Af Amer 11/19/2012 33* >59 mL/min/1.73 Final  . GFR calc Af Amer 11/19/2012 38* >59 mL/min/1.73 Final  . BUN/Creatinine Ratio 11/19/2012 19  11 - 26 Final  . Sodium 11/19/2012 141  134 - 144 mmol/L Final  . Potassium 11/19/2012 4.4  3.5 - 5.2 mmol/L Final  . Chloride 11/19/2012 101  97 - 108 mmol/L Final  . CO2 11/19/2012 25  18 - 29 mmol/L Final  . Calcium 11/19/2012 9.5  8.6 - 10.2 mg/dL Final  . Total Protein 11/19/2012 6.6  6.0 - 8.5 g/dL Final  . Albumin 11/19/2012 3.8  3.5 - 4.8 g/dL Final  . Globulin, Total  11/19/2012 2.8  1.5 - 4.5 g/dL Final  . Albumin/Globulin Ratio 11/19/2012 1.4  1.1 - 2.5 Final  . Total Bilirubin 11/19/2012 0.4  0.0 - 1.2 mg/dL Final  . Alkaline Phosphatase 11/19/2012 75  39 - 117  IU/L Final  . AST 11/19/2012 14  0 - 40 IU/L Final  . ALT 11/19/2012 9  0 - 32 IU/L Final  . WBC 11/19/2012 4.7  3.4 - 10.8 x10E3/uL Final  . RBC 11/19/2012 4.38  3.77 - 5.28 x10E6/uL Final  . Hemoglobin 11/19/2012 13.2  11.1 - 15.9 g/dL Final  . HCT 11/19/2012 39.8  34.0 - 46.6 % Final  . MCV 11/19/2012 91  79 - 97 fL Final  . MCH 11/19/2012 30.1  26.6 - 33.0 pg Final  . MCHC 11/19/2012 33.2  31.5 - 35.7 g/dL Final  . RDW 11/19/2012 13.7  12.3 - 15.4 % Final  . Neutrophils Relative % 11/19/2012 61   Final  . Lymphs 11/19/2012 27   Final  . Monocytes 11/19/2012 8   Final  . Eos 11/19/2012 3   Final  . Basos 11/19/2012 1   Final  . Neutrophils Absolute 11/19/2012 2.9  1.4 - 7.0 x10E3/uL Final  . Lymphocytes Absolute 11/19/2012 1.3  0.7 - 3.1 x10E3/uL Final  . Monocytes Absolute 11/19/2012 0.4  0.1 - 0.9 x10E3/uL Final  . Eosinophils Absolute 11/19/2012 0.1  0.0 - 0.4 x10E3/uL Final  . Basophils Absolute 11/19/2012 0.0  0.0 - 0.2 x10E3/uL Final  . Immature Granulocytes 11/19/2012 0   Final  . Immature Grans (Abs) 11/19/2012 0.0  0.0 - 0.1 x10E3/uL Final       Assessment & Plan:  HTN (hypertension): controlled  Need for Streptococcus pneumoniae vaccination - Plan: Pneumococcal polysaccharide vaccine 23-valent greater than or equal to 2yo subcutaneous/IM. Administered.  Need for prophylactic vaccination and inoculation against influenza: Administered.  Hyperglycemia: normal on recent lab.  Chronic kidney disease, stage II (mild): unchanged  Thrombocytopenia, unspecified: was not rechecked on recent lab

## 2012-11-23 NOTE — Patient Instructions (Addendum)
Continue current medications. Consider stopping Fish oil.

## 2013-03-28 ENCOUNTER — Other Ambulatory Visit: Payer: Self-pay | Admitting: Internal Medicine

## 2013-05-24 ENCOUNTER — Encounter: Payer: Self-pay | Admitting: Internal Medicine

## 2013-05-24 ENCOUNTER — Ambulatory Visit (INDEPENDENT_AMBULATORY_CARE_PROVIDER_SITE_OTHER): Payer: BC Managed Care – PPO | Admitting: Internal Medicine

## 2013-05-24 VITALS — BP 150/100 | HR 68 | Temp 97.4°F | Resp 18 | Ht 65.0 in | Wt 105.4 lb

## 2013-05-24 DIAGNOSIS — R9431 Abnormal electrocardiogram [ECG] [EKG]: Secondary | ICD-10-CM

## 2013-05-24 DIAGNOSIS — N182 Chronic kidney disease, stage 2 (mild): Secondary | ICD-10-CM

## 2013-05-24 DIAGNOSIS — R739 Hyperglycemia, unspecified: Secondary | ICD-10-CM

## 2013-05-24 DIAGNOSIS — E785 Hyperlipidemia, unspecified: Secondary | ICD-10-CM

## 2013-05-24 DIAGNOSIS — R7309 Other abnormal glucose: Secondary | ICD-10-CM

## 2013-05-24 DIAGNOSIS — I10 Essential (primary) hypertension: Secondary | ICD-10-CM

## 2013-05-24 MED ORDER — LOSARTAN POTASSIUM 50 MG PO TABS: ORAL_TABLET | ORAL | Status: DC

## 2013-05-24 NOTE — Patient Instructions (Signed)
Call if still coughing on July 4th.

## 2013-05-24 NOTE — Progress Notes (Signed)
Patient ID: Lindsey Salazar, female   DOB: 1932/12/02, 80 y.o.   MRN: [id number]    Location:  [name]   Place of Service: OFFICE    Allergies  Allergen Reactions  . Penicillins     Chief Complaint  Patient presents with  . Follow-up    patient has some questions    HPI:  Feeling well. BP still running high. Some light headed feelings. Denies headache.  Coughing since March 2015 when she had a bronchitis. Dry cough. No night sweats r fever.. No reflux.  Medications: Patient's Medications  New Prescriptions   No medications on file  Previous Medications   ACETAMINOPHEN (TYLENOL) 500 MG TABLET    Take 500 mg by mouth every 6 (six) hours as needed.   ASPIRIN 81 MG TABLET    Take 81 mg by mouth daily.     ATORVASTATIN (LIPITOR) 10 MG TABLET    TAKE 1 TABLET ONCE DAILY TO LOWER CHOLESTEROL   CALCIUM-VITAMIN D PO    Take by mouth 2 (two) times daily.     MULTIPLE VITAMIN (MULTIVITAMIN) CAPSULE    Take 1 capsule by mouth daily.     NON FORMULARY    2 (two) times daily. occu support tabs bid   POLYETHYL GLYCOL-PROPYL GLYCOL (SYSTANE OP)    Apply to eye daily.    Modified Medications   No medications on file  Discontinued Medications   OMEGA-3 FATTY ACIDS (FISH OIL PO)    Take 1,200 mg by mouth 2 (two) times daily.     TDAP (BOOSTRIX) 5-2.5-18.5 LF-MCG/0.5 INJECTION    Inject 0.5 mLs into the muscle once.     Review of Systems  Constitutional: Positive for fatigue. Negative for fever, chills, activity change and appetite change.  HENT: Negative for congestion and rhinorrhea.   Eyes: Negative.   Respiratory: Positive for cough (non productive ). Negative for chest tightness and shortness of breath.   Cardiovascular: Negative.   Gastrointestinal: Negative.  Negative for nausea, vomiting, abdominal pain, constipation and abdominal distention.  Endocrine: Negative.   Musculoskeletal: Positive for gait problem.  Skin: Negative.        Complains of easy bruising.    Neurological: Negative.        Complaint of some balance issues.a  Hematological: Negative.   Psychiatric/Behavioral: Positive for sleep disturbance.       Wakes up after about an hour of sleep and is awake for another 3 hours.    Filed Vitals:   05/24/13 1325  BP: 150/100  Pulse: 68  Temp: 97.4 F (36.3 C)  TempSrc: Oral  Resp: 18  Height: 5\' 5"  (1.651 m)  Weight: 105 lb 6.4 oz (47.809 kg)  SpO2: 95%   Physical Exam  Constitutional: She is oriented to person, place, and time. She appears well-developed and well-nourished. No distress.  HENT:  Head: Normocephalic and atraumatic.  Right Ear: External ear normal.  Left Ear: External ear normal.  Nose: Nose normal.  Mouth/Throat: Oropharynx is clear and moist.  Eyes: Conjunctivae and EOM are normal. Pupils are equal, round, and reactive to light.  Corrective lenses.  Neck: Normal range of motion. Neck supple. No JVD present. No tracheal deviation present. No thyromegaly present.  Cardiovascular: Normal rate and regular rhythm.   Murmur heard. Pulmonary/Chest: Effort normal. No respiratory distress. She has no wheezes. She has rales (right lower lobe). She exhibits no tenderness.  Abdominal: Soft. Bowel sounds are normal. She exhibits no distension and no mass. There is  no tenderness.  Musculoskeletal: Normal range of motion. She exhibits no edema and no tenderness.  Lymphadenopathy:    She has no cervical adenopathy.  Neurological: She is alert and oriented to person, place, and time. She has normal reflexes. No cranial nerve deficit. Coordination normal.  Intact vibratory  Skin: Skin is warm and dry. She is not diaphoretic. No erythema. No pallor.  Psychiatric: She has a normal mood and affect. Her behavior is normal. Judgment and thought content normal.     Labs reviewed: No visits with results within 3 Month(s) from this visit. Latest known visit with results is:  Office Visit on 11/23/2012  Component Date Value Ref  Range Status  . HM Mammogram 11/03/2011 Normal   Final  . HM Colonoscopy 08/20/2004 Normal-Dr.Medoff   Final  . HM Dexa Scan 11/01/2010 F/u in 2 years, osteopenia   Final      Assessment/Plan  1. HTN (hypertension) Add medication - EKG 12-Lead; Future - Comprehensive metabolic panel; Future - losartan (COZAAR) 50 MG tablet; One daily to lowwer BP  Dispense: 90 tablet; Refill: 4  2. Hyperlipidemia Check next visit - Lipid panel; Future  3. Hyperglycemia Check next visit - Comprehensive metabolic panel; Future  4. Chronic kidney disease, stage II (mild) Check next visit - Comprehensive metabolic panel; Future  5. Abnormal EKG Check next visit - EKG 12-Lead; Future

## 2013-08-01 ENCOUNTER — Other Ambulatory Visit: Payer: Self-pay | Admitting: *Deleted

## 2013-08-01 MED ORDER — ATORVASTATIN CALCIUM 10 MG PO TABS: ORAL_TABLET | ORAL | Status: DC

## 2013-08-01 NOTE — Telephone Encounter (Signed)
Patient requested to be sent to [facility] instead of [facility].

## 2013-10-04 ENCOUNTER — Telehealth: Payer: Self-pay | Admitting: *Deleted

## 2013-10-04 NOTE — Telephone Encounter (Signed)
Patient called and wanted to know if it would be ok to wait till appointment in November to get her Flu Vaccine. I spoke with Dr. Bubba Camp and she stated that this would be fine. Patient Notified and agreed.

## 2013-11-16 LAB — HM MAMMOGRAPHY

## 2013-11-18 ENCOUNTER — Encounter: Payer: Self-pay | Admitting: *Deleted

## 2013-11-25 ENCOUNTER — Encounter: Payer: Self-pay | Admitting: Internal Medicine

## 2013-11-28 ENCOUNTER — Other Ambulatory Visit: Payer: BC Managed Care – PPO

## 2013-11-28 DIAGNOSIS — I10 Essential (primary) hypertension: Secondary | ICD-10-CM

## 2013-11-28 DIAGNOSIS — E785 Hyperlipidemia, unspecified: Secondary | ICD-10-CM

## 2013-11-28 DIAGNOSIS — R739 Hyperglycemia, unspecified: Secondary | ICD-10-CM

## 2013-11-28 DIAGNOSIS — N182 Chronic kidney disease, stage 2 (mild): Secondary | ICD-10-CM

## 2013-11-29 LAB — COMPREHENSIVE METABOLIC PANEL
ALK PHOS: 79 IU/L (ref 39–117)
ALT: 14 IU/L (ref 0–32)
AST: 13 IU/L (ref 0–40)
Albumin/Globulin Ratio: 1.3 (ref 1.1–2.5)
Albumin: 3.7 g/dL (ref 3.5–4.8)
BILIRUBIN TOTAL: 0.4 mg/dL (ref 0.0–1.2)
BUN/Creatinine Ratio: 17 (ref 11–26)
BUN: 27 mg/dL (ref 8–27)
CO2: 23 mmol/L (ref 18–29)
Calcium: 9.4 mg/dL (ref 8.7–10.3)
Chloride: 101 mmol/L (ref 97–108)
Creatinine, Ser: 1.6 mg/dL — ABNORMAL HIGH (ref 0.57–1.00)
GFR calc non Af Amer: 31 mL/min/{1.73_m2} — ABNORMAL LOW (ref >59)
GFR, EST AFRICAN AMERICAN: 35 mL/min/{1.73_m2} — AB (ref >59)
Globulin, Total: 2.8 g/dL (ref 1.5–4.5)
Glucose: 84 mg/dL (ref 65–99)
Potassium: 4.6 mmol/L (ref 3.5–5.2)
Sodium: 140 mmol/L (ref 134–144)
TOTAL PROTEIN: 6.5 g/dL (ref 6.0–8.5)

## 2013-11-29 LAB — LIPID PANEL
Chol/HDL Ratio: 2.8 ratio units (ref 0.0–4.4)
Cholesterol, Total: 159 mg/dL (ref 100–199)
HDL: 57 mg/dL (ref >39)
LDL Calculated: 86 mg/dL (ref 0–99)
Triglycerides: 78 mg/dL (ref 0–149)
VLDL CHOLESTEROL CAL: 16 mg/dL (ref 5–40)

## 2013-11-30 ENCOUNTER — Ambulatory Visit (INDEPENDENT_AMBULATORY_CARE_PROVIDER_SITE_OTHER): Payer: BC Managed Care – PPO | Admitting: *Deleted

## 2013-11-30 ENCOUNTER — Encounter: Payer: Self-pay | Admitting: Internal Medicine

## 2013-11-30 ENCOUNTER — Ambulatory Visit (INDEPENDENT_AMBULATORY_CARE_PROVIDER_SITE_OTHER): Payer: BC Managed Care – PPO | Admitting: Internal Medicine

## 2013-11-30 VITALS — BP 120/80 | HR 73 | Temp 97.8°F | Ht 65.0 in | Wt 104.8 lb

## 2013-11-30 DIAGNOSIS — Z23 Encounter for immunization: Secondary | ICD-10-CM

## 2013-11-30 DIAGNOSIS — E785 Hyperlipidemia, unspecified: Secondary | ICD-10-CM

## 2013-11-30 DIAGNOSIS — I10 Essential (primary) hypertension: Secondary | ICD-10-CM

## 2013-11-30 DIAGNOSIS — R05 Cough: Secondary | ICD-10-CM | POA: Insufficient documentation

## 2013-11-30 DIAGNOSIS — R739 Hyperglycemia, unspecified: Secondary | ICD-10-CM

## 2013-11-30 DIAGNOSIS — R059 Cough, unspecified: Secondary | ICD-10-CM | POA: Insufficient documentation

## 2013-11-30 DIAGNOSIS — R413 Other amnesia: Secondary | ICD-10-CM

## 2013-11-30 DIAGNOSIS — I059 Rheumatic mitral valve disease, unspecified: Secondary | ICD-10-CM

## 2013-11-30 DIAGNOSIS — N182 Chronic kidney disease, stage 2 (mild): Secondary | ICD-10-CM

## 2013-11-30 NOTE — Progress Notes (Signed)
Patient failed clock test.

## 2013-11-30 NOTE — Progress Notes (Signed)
Patient ID: Lindsey Salazar, female   DOB: 01-12-1933, 80 y.o.   MRN: [id number]    HISTORY AND PHYSICAL  Location:    [name]   Place of Service:   OFFICE  Extended Emergency Contact Information Primary Emergency Contact: Saravia,Wilmer Address: [address], [place] Phone: [phone] Relation: Spouse   Chief Complaint  Patient presents with  . Annual Exam    Yearly Physical, Discuss labs (copy printed)    HPI:  Cough: comes and goes. No fever or weight loss. Has some yellow phlegm. Says her current cough got worse after a cold about a month ago. Does not keep her awake. Responds to dextromethorphan.  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 Forgetting details. Worried because her father died with Alzheimer's.     Past Medical History  Diagnosis Date  . Hyperlipidemia   . Cough   . Hearing loss   . Acute bronchitis   . Thrombocytopenia, unspecified   . Macular degeneration (senile) of retina, unspecified   . Senile cataract, unspecified   . Impacted cerumen   . Chronic kidney disease, stage II (mild)   . Nonspecific abnormal electrocardiogram (ECG) (EKG)   . Other abnormal blood chemistry   . Hypertension   . Other emphysema   . Nonspecific abnormal electrocardiogram (ECG) (EKG)   . Encounter for long-term (current) use of other medications   . Mitral valve disorders   . Osteoporosis, unspecified   . Chronic ischemic heart disease, unspecified   . Other malaise and fatigue   . Unspecified hearing loss   . Ganglion, unspecified   . Benign neoplasm of uterus, part unspecified   . Palpitations     Past Surgical History  Procedure Laterality Date  . Knee  arthroscopy      right    Patient Care Team: Estill Dooms, MD as PCP - General (Internal Medicine) Minus Breeding, MD as Consulting Physician (Cardiology) Stark Klein, MD as Consulting Physician (General Surgery) Augustin Schooling, MD as Consulting Physician (Orthopedic Surgery) Mayme Genta, MD as Consulting Physician (Gastroenterology)  History   Social History  . Marital Status: Single    Spouse Name: N/A    Number of Children: 4  . Years of Education: N/A   Occupational History  .     Social History Main Topics  . Smoking status: Never Smoker   . Smokeless tobacco: Not on file  . Alcohol Use: No  . Drug Use: No  . Sexual Activity: Not on file   Other Topics Concern  . Not on file   Social History Narrative   Lives with husband.       reports that she has never smoked. She does not have any smokeless tobacco history on file. She reports that she does not drink alcohol or use illicit drugs.   Family History  Problem Relation Age of Onset  . Stroke Brother   . Cancer Sister     lung  . Parkinsonism Father     Alzheimer's   Family Status  Relation Status Death Age  . Brother Deceased   . Sister Deceased   . Father Deceased 78    Parkinsons, Alzheimer's  .  Mother Deceased   . Sister Alive   . Daughter Alive   . Son Alive   . Son Alive   . Son Alive   . Sister Alive       Immunization History  Administered Date(s) Administered  . Influenza,inj,Quad PF,36+ Mos 11/23/2012, 11/30/2013  . Influenza-Unspecified 10/05/2009, 10/25/2010, 10/13/2011  . Pneumococcal Polysaccharide-23 11/23/2012  . Pneumococcal-Unspecified 01/21/1995  . Td 08/20/1992  . Tdap 11/25/2012  . Zoster 02/11/2006    Allergies  Allergen Reactions  . Penicillins     Medications: Patient's Medications  New Prescriptions   No medications on file  Previous Medications   ACETAMINOPHEN (TYLENOL) 500 MG TABLET    Take 500 mg by mouth every 6 (six) hours as needed.    ASPIRIN 81 MG TABLET    Take 81 mg by mouth daily.     ATORVASTATIN (LIPITOR) 10 MG TABLET    Take one tablet by mouth once daily to lower cholesterol   CALCIUM-VITAMIN D PO    Take by mouth 2 (two) times daily.     LOSARTAN (COZAAR) 50 MG TABLET    One daily to lowwer BP   MULTIPLE VITAMIN (MULTIVITAMIN) CAPSULE    Take 1 capsule by mouth daily.     NON FORMULARY    2 (two) times daily. occu support tabs bid   POLYETHYL GLYCOL-PROPYL GLYCOL (SYSTANE OP)    Apply to eye daily.    Modified Medications   No medications on file  Discontinued Medications   No medications on file     Review of Systems  Constitutional: Positive for fatigue. Negative for fever, chills, activity change and appetite change.  HENT: Negative for congestion and rhinorrhea.   Eyes: Negative.   Respiratory: Positive for cough (non productive ). Negative for chest tightness and shortness of breath.   Cardiovascular: Negative.   Gastrointestinal: Negative.  Negative for nausea, vomiting, abdominal pain, constipation and abdominal distention.  Endocrine: Negative.   Musculoskeletal: Positive for gait problem.  Skin: Negative.        Complains of easy bruising.  Neurological: Negative.        Complaint of some balance issues.a  Hematological: Negative.   Psychiatric/Behavioral: Positive for sleep disturbance.       Wakes up after about an hour of sleep and is awake for another 3 hours.    Filed Vitals:   11/30/13 1304  BP: 120/80  Pulse: 73  Temp: 97.8 F (36.6 C)  TempSrc: Oral  Height: 5\' 5"  (1.651 m)  Weight: 104 lb 12.8 oz (47.537 kg)  SpO2: 99%   Body mass index is 17.44 kg/(m^2).  Physical Exam  Constitutional: She is oriented to person, place, and time. She appears well-developed and well-nourished. No distress.  HENT:  Head: Normocephalic and atraumatic.  Right Ear: External ear normal.  Left Ear: External ear normal.  Nose: Nose normal.  Mouth/Throat: Oropharynx is clear and moist.  Eyes:  Conjunctivae and EOM are normal. Pupils are equal, round, and reactive to light.  Corrective lenses.  Neck: Normal range of motion. Neck supple. No JVD present. No tracheal deviation present. No thyromegaly present.  Cardiovascular: Normal rate and regular rhythm.   Murmur heard. Pulmonary/Chest: Effort normal. No respiratory distress. She has no wheezes. She has rales (right lower lobe). She exhibits no tenderness.  Abdominal: Soft. Bowel sounds are normal. She exhibits no distension and no mass. There is no tenderness.  Musculoskeletal: Normal range of motion. She exhibits no edema or tenderness.  Lymphadenopathy:    She has no cervical adenopathy.  Neurological: She is alert and oriented to person, place, and time. She has normal reflexes. No cranial nerve deficit. Coordination normal.  Intact vibratory. 11/30/13 MMSE 26/30. Passed clock drawing.  Skin: Skin is warm and dry. She is not diaphoretic. No erythema. No pallor.  Psychiatric: She has a normal mood and affect. Her behavior is normal. Judgment and thought content normal.     Labs reviewed: Appointment on 11/28/2013  Component Date Value Ref Range Status  . Glucose 11/28/2013 84  65 - 99 mg/dL Final  . BUN 11/28/2013 27  8 - 27 mg/dL Final  . Creatinine, Ser 11/28/2013 1.60* 0.57 - 1.00 mg/dL Final  . GFR calc non Af Amer 11/28/2013 31* >59 mL/min/1.73 Final  . GFR calc Af Amer 11/28/2013 35* >59 mL/min/1.73 Final  . BUN/Creatinine Ratio 11/28/2013 17  11 - 26 Final  . Sodium 11/28/2013 140  134 - 144 mmol/L Final  . Potassium 11/28/2013 4.6  3.5 - 5.2 mmol/L Final  . Chloride 11/28/2013 101  97 - 108 mmol/L Final  . CO2 11/28/2013 23  18 - 29 mmol/L Final  . Calcium 11/28/2013 9.4  8.7 - 10.3 mg/dL Final  . Total Protein 11/28/2013 6.5  6.0 - 8.5 g/dL Final  . Albumin 11/28/2013 3.7  3.5 - 4.8 g/dL Final  . Globulin, Total 11/28/2013 2.8  1.5 - 4.5 g/dL Final  . Albumin/Globulin Ratio 11/28/2013 1.3  1.1 - 2.5 Final  .  Total Bilirubin 11/28/2013 0.4  0.0 - 1.2 mg/dL Final  . Alkaline Phosphatase 11/28/2013 79  39 - 117 IU/L Final  . AST 11/28/2013 13  0 - 40 IU/L Final  . ALT 11/28/2013 14  0 - 32 IU/L Final  . Cholesterol, Total 11/28/2013 159  100 - 199 mg/dL Final  . Triglycerides 11/28/2013 78  0 - 149 mg/dL Final  . HDL 11/28/2013 57  >39 mg/dL Final   Comment: According to ATP-III Guidelines, HDL-C >59 mg/dL is considered a negative risk factor for CHD.   Marland Kitchen VLDL Cholesterol Cal 11/28/2013 16  5 - 40 mg/dL Final  . LDL Calculated 11/28/2013 86  0 - 99 mg/dL Final  . Chol/HDL Ratio 11/28/2013 2.8  0.0 - 4.4 ratio units Final   Comment:                                   T. Chol/HDL Ratio                                             Men  Women                               1/2 Avg.Risk  3.4    3.3                                   Avg.Risk  5.0    4.4                                2X Avg.Risk  9.6    7.1  3X Avg.Risk 23.4   11.0   Abstract on 11/18/2013  Component Date Value Ref Range Status  . HM Mammogram 11/16/2013 [facility]-no mammographic evidence of malignancy repeat in 1 year   Final     Assessment/Plan  1. Cough Watchful waiting. Had normal CXR in March 2014. Does not want to see Pulmo yet.  2. Essential hypertension controlled  3. Hyperlipidemia controlled  4. Hyperglycemia controlled  5. Chronic kidney disease, stage II (mild) Elevated creatinine  6. Mitral valve disorder unchanged  7. Memory change MMSE next OV

## 2014-01-18 ENCOUNTER — Encounter: Payer: Self-pay | Admitting: Internal Medicine

## 2014-01-18 ENCOUNTER — Ambulatory Visit (INDEPENDENT_AMBULATORY_CARE_PROVIDER_SITE_OTHER): Payer: BC Managed Care – PPO | Admitting: Internal Medicine

## 2014-01-18 VITALS — BP 140/90 | HR 75 | Temp 98.1°F | Resp 18 | Ht 65.0 in | Wt 103.6 lb

## 2014-01-18 DIAGNOSIS — J209 Acute bronchitis, unspecified: Secondary | ICD-10-CM

## 2014-01-18 DIAGNOSIS — J438 Other emphysema: Secondary | ICD-10-CM

## 2014-01-18 MED ORDER — TIOTROPIUM BROMIDE MONOHYDRATE 18 MCG IN CAPS: 18.0000 ug | ORAL_CAPSULE | RESPIRATORY_TRACT | Status: DC

## 2014-01-18 MED ORDER — AZITHROMYCIN 250 MG PO TABS: ORAL_TABLET | ORAL | Status: DC

## 2014-01-18 NOTE — Patient Instructions (Signed)
Push fluids and rest.  Acute Bronchitis Bronchitis is inflammation of the airways that extend from the windpipe into the lungs (bronchi). The inflammation often causes mucus to develop. This leads to a cough, which is the most common symptom of bronchitis.  In acute bronchitis, the condition usually develops suddenly and goes away over time, usually in a couple weeks. Smoking, allergies, and asthma can make bronchitis worse. Repeated episodes of bronchitis may cause further lung problems.  CAUSES Acute bronchitis is most often caused by the same virus that causes a cold. The virus can spread from person to person (contagious) through coughing, sneezing, and touching contaminated objects. SIGNS AND SYMPTOMS   Cough.   Fever.   Coughing up mucus.   Body aches.   Chest congestion.   Chills.   Shortness of breath.   Sore throat.  DIAGNOSIS  Acute bronchitis is usually diagnosed through a physical exam. Your health care provider will also ask you questions about your medical history. Tests, such as chest X-rays, are sometimes done to rule out other conditions.  TREATMENT  Acute bronchitis usually goes away in a couple weeks. Oftentimes, no medical treatment is necessary. Medicines are sometimes given for relief of fever or cough. Antibiotic medicines are usually not needed but may be prescribed in certain situations. In some cases, an inhaler may be recommended to help reduce shortness of breath and control the cough. A cool mist vaporizer may also be used to help thin bronchial secretions and make it easier to clear the chest.  HOME CARE INSTRUCTIONS  Get plenty of rest.   Drink enough fluids to keep your urine clear or pale yellow (unless you have a medical condition that requires fluid restriction). Increasing fluids may help thin your respiratory secretions (sputum) and reduce chest congestion, and it will prevent dehydration.   Take medicines only as directed by your  health care provider.  If you were prescribed an antibiotic medicine, finish it all even if you start to feel better.  Avoid smoking and secondhand smoke. Exposure to cigarette smoke or irritating chemicals will make bronchitis worse. If you are a smoker, consider using nicotine gum or skin patches to help control withdrawal symptoms. Quitting smoking will help your lungs heal faster.   Reduce the chances of another bout of acute bronchitis by washing your hands frequently, avoiding people with cold symptoms, and trying not to touch your hands to your mouth, nose, or eyes.   Keep all follow-up visits as directed by your health care provider.  SEEK MEDICAL CARE IF: Your symptoms do not improve after 1 week of treatment.  SEEK IMMEDIATE MEDICAL CARE IF:  You develop an increased fever or chills.   You have chest pain.   You have severe shortness of breath.  You have bloody sputum.   You develop dehydration.  You faint or repeatedly feel like you are going to pass out.  You develop repeated vomiting.  You develop a severe headache. MAKE SURE YOU:   Understand these instructions.  Will watch your condition.  Will get help right away if you are not doing well or get worse. Document Released: 02/14/2004 Document Revised: 05/23/2013 Document Reviewed: 06/29/2012 [facility] Patient Information 2015 [place], [place]. This information is not intended to replace advice given to you by your health care provider. Make sure you discuss any questions you have with your health care provider.

## 2014-01-18 NOTE — Progress Notes (Signed)
Patient ID: Lindsey Salazar, female   DOB: 11-04-1932, 80 y.o.   MRN: [id number]    Facility  [name]    Place of Service:   OFFICE   Allergies  Allergen Reactions  . Penicillins     Chief Complaint  Patient presents with  . Acute Visit    cough for approx 3 months w/phlegm, lightheadness    HPI:  80 yo female presents today with 5 mo hx persistent cough leading to lightheadedness. 1 week ago, cough became productive. Children encouraged her to be seen. No other concerns and cough has not interrupted quality of life. No sleep disturbance.No hemoptysis. No weight loss, night sweats, f/c. No HA, sore throat, CP, SOB. No N/V. Denies hx smoking or second hand smoke exposure. No ocupational exposure  Medications: Patient's Medications  New Prescriptions   No medications on file  Previous Medications   ACETAMINOPHEN (TYLENOL) 500 MG TABLET    Take 500 mg by mouth every 6 (six) hours as needed.   ASPIRIN 81 MG TABLET    Take 81 mg by mouth daily.     ATORVASTATIN (LIPITOR) 10 MG TABLET    Take one tablet by mouth once daily to lower cholesterol   CALCIUM-VITAMIN D PO    Take by mouth 2 (two) times daily.     LOSARTAN (COZAAR) 50 MG TABLET    One daily to lowwer BP   MULTIPLE VITAMIN (MULTIVITAMIN) CAPSULE    Take 1 capsule by mouth daily.     NON FORMULARY    2 (two) times daily. occu support tabs bid   POLYETHYL GLYCOL-PROPYL GLYCOL (SYSTANE OP)    Apply to eye daily.    Modified Medications   No medications on file  Discontinued Medications   No medications on file     Review of Systems   As above. All other systems reviewed are neg.  Filed Vitals:   01/18/14 1539  BP: 140/90  Pulse: 75  Temp: 98.1 F (36.7 C)  TempSrc: Oral  Resp: 18  Height: 5\' 5"  (1.651 m)  Weight: 103 lb 9.6 oz (46.993 kg)  SpO2: 96%   Body mass index is 17.24 kg/(m^2).  Physical Exam CONSTITUTIONAL: Looks well in NAD. Awake, alert and oriented x 3 HEENT: PERRLA. Oropharynx clear and  without exudate NECK: Supple. Nontender. No palpable cervical or supraclavicular lymph nodes. CVS: Regular rate without murmur, gallop or rub. LUNGS: (+) end expiratory wheezing with cough and prolonged expiratory phase. No rales or rhonchi. EXTREMITIES: No edema b/l.   Labs reviewed: Appointment on 11/28/2013  Component Date Value Ref Range Status  . Glucose 11/28/2013 84  65 - 99 mg/dL Final  . BUN 11/28/2013 27  8 - 27 mg/dL Final  . Creatinine, Ser 11/28/2013 1.60* 0.57 - 1.00 mg/dL Final  . GFR calc non Af Amer 11/28/2013 31* >59 mL/min/1.73 Final  . GFR calc Af Amer 11/28/2013 35* >59 mL/min/1.73 Final  . BUN/Creatinine Ratio 11/28/2013 17  11 - 26 Final  . Sodium 11/28/2013 140  134 - 144 mmol/L Final  . Potassium 11/28/2013 4.6  3.5 - 5.2 mmol/L Final  . Chloride 11/28/2013 101  97 - 108 mmol/L Final  . CO2 11/28/2013 23  18 - 29 mmol/L Final  . Calcium 11/28/2013 9.4  8.7 - 10.3 mg/dL Final  . Total Protein 11/28/2013 6.5  6.0 - 8.5 g/dL Final  . Albumin 11/28/2013 3.7  3.5 - 4.8 g/dL Final  . Globulin, Total 11/28/2013 2.8  1.5 -  4.5 g/dL Final  . Albumin/Globulin Ratio 11/28/2013 1.3  1.1 - 2.5 Final  . Total Bilirubin 11/28/2013 0.4  0.0 - 1.2 mg/dL Final  . Alkaline Phosphatase 11/28/2013 79  39 - 117 IU/L Final  . AST 11/28/2013 13  0 - 40 IU/L Final  . ALT 11/28/2013 14  0 - 32 IU/L Final  . Cholesterol, Total 11/28/2013 159  100 - 199 mg/dL Final  . Triglycerides 11/28/2013 78  0 - 149 mg/dL Final  . HDL 11/28/2013 57  >39 mg/dL Final   Comment: According to ATP-III Guidelines, HDL-C >59 mg/dL is considered a negative risk factor for CHD.   Marland Kitchen VLDL Cholesterol Cal 11/28/2013 16  5 - 40 mg/dL Final  . LDL Calculated 11/28/2013 86  0 - 99 mg/dL Final  . Chol/HDL Ratio 11/28/2013 2.8  0.0 - 4.4 ratio units Final   Comment:                                   T. Chol/HDL Ratio                                             Men  Women                               1/2  Avg.Risk  3.4    3.3                                   Avg.Risk  5.0    4.4                                2X Avg.Risk  9.6    7.1                                3X Avg.Risk 23.4   11.0   Abstract on 11/18/2013  Component Date Value Ref Range Status  . HM Mammogram 11/16/2013 [facility]-no mammographic evidence of malignancy repeat in 1 year   Final       *RADIOLOGY REPORT*  Clinical Data: Cough  CHEST - 2 VIEW  Comparison: 02/12/2010  Findings: Scarring in the lingula and right middle lobe is stable. Hyperaeration and interstitial prominence are stable. Vague nodular densities in the right upper lung zone are stable. No new consolidation or mass. No pleural effusion and no pneumothorax.  IMPRESSION: Chronic changes.   Original Report Authenticated By: Marybelle Killings, M.D.     Assessment/Plan    ICD-9-CM ICD-10-CM   1. Acute bronchitis, unspecified organism 466.0 J20.9   2. Other emphysema 492.8 J43.8    per chest xray report in 2012; changes stable per 2014 CXR report    - Rx zpak as directed -highly recommend pulmonary eval especially in light of 2012 CXR showing emphysema changes that were stable in 2014 - Rx spiriva daily and rinse mouth after each use. She was intolerant to rescue HFA in past   [place] [facility], [facility] [address]  [place], [place] [address] [phone] Office (Wednesdays and Fridays 8 AM - 5 PM) [phone] Cell (Monday-Friday 8 AM - 5 PM)

## 2014-01-31 ENCOUNTER — Other Ambulatory Visit: Payer: Self-pay

## 2014-01-31 MED ORDER — ATORVASTATIN CALCIUM 10 MG PO TABS: ORAL_TABLET | ORAL | Status: DC

## 2014-05-30 ENCOUNTER — Encounter: Payer: Self-pay | Admitting: Internal Medicine

## 2014-05-30 ENCOUNTER — Ambulatory Visit (INDEPENDENT_AMBULATORY_CARE_PROVIDER_SITE_OTHER): Payer: Medicare Other | Admitting: Internal Medicine

## 2014-05-30 VITALS — BP 130/70 | HR 65 | Temp 97.9°F | Resp 20 | Ht 65.0 in | Wt 104.8 lb

## 2014-05-30 DIAGNOSIS — R05 Cough: Secondary | ICD-10-CM | POA: Diagnosis not present

## 2014-05-30 DIAGNOSIS — R739 Hyperglycemia, unspecified: Secondary | ICD-10-CM

## 2014-05-30 DIAGNOSIS — R002 Palpitations: Secondary | ICD-10-CM | POA: Diagnosis not present

## 2014-05-30 DIAGNOSIS — E785 Hyperlipidemia, unspecified: Secondary | ICD-10-CM

## 2014-05-30 DIAGNOSIS — I10 Essential (primary) hypertension: Secondary | ICD-10-CM

## 2014-05-30 DIAGNOSIS — R059 Cough, unspecified: Secondary | ICD-10-CM

## 2014-05-30 MED ORDER — METOPROLOL TARTRATE 50 MG PO TABS: ORAL_TABLET | ORAL | Status: DC

## 2014-05-30 NOTE — Progress Notes (Signed)
Patient ID: Lindsey Salazar, female   DOB: 05-02-1932, 81 y.o.   MRN: [id number]    Facility  [name]    Place of Service:   OFFICE    Allergies  Allergen Reactions  . Penicillins     Chief Complaint  Patient presents with  . Medical Management of Chronic Issues    6 month follow-up    HPI:  Cough from Dec 2015 resolved, then 1-2 week ago she started with a productive cough. Last phlegm was 05/27/14. No fever or hemoptysis.   Palpitations on April 16 and 30th,, 2016. Rapid rate to 129. She rested and it stopped. Denies chest pain or dyspnea. Has had this issue for decades.  Sometimes has a light headed feeling. Asks if the losartan could cause this. BP does not change.  Medications: Patient's Medications  New Prescriptions   No medications on file  Previous Medications   ACETAMINOPHEN (TYLENOL) 500 MG TABLET    Take 500 mg by mouth every 6 (six) hours as needed.   ASPIRIN 81 MG TABLET    Take 81 mg by mouth daily.     ATORVASTATIN (LIPITOR) 10 MG TABLET    Take one tablet by mouth once daily to lower cholesterol   AZITHROMYCIN (ZITHROMAX) 250 MG TABLET    Take as directed   CALCIUM-VITAMIN D PO    Take by mouth 2 (two) times daily.     LOSARTAN (COZAAR) 50 MG TABLET    One daily to lowwer BP   MULTIPLE VITAMIN (MULTIVITAMIN) CAPSULE    Take 1 capsule by mouth daily.     NON FORMULARY    2 (two) times daily. occu support tabs bid   POLYETHYL GLYCOL-PROPYL GLYCOL (SYSTANE OP)    Apply to eye daily.     TIOTROPIUM (SPIRIVA HANDIHALER) 18 MCG INHALATION CAPSULE    Place 1 capsule (18 mcg total) into inhaler and inhale 1 day or 1 dose.  Modified Medications   No medications on file  Discontinued Medications   No medications on file     Review of Systems  Constitutional: Positive for fatigue. Negative for fever, chills, activity change and appetite change.  HENT: Negative for congestion and rhinorrhea.   Eyes: Negative.   Respiratory: Positive for cough (mild dry  cough). Negative for chest tightness and shortness of breath.   Cardiovascular: Positive for palpitations.  Gastrointestinal: Negative.  Negative for nausea, vomiting, abdominal pain, constipation and abdominal distention.  Endocrine: Negative.   Musculoskeletal: Positive for gait problem.  Skin: Negative.        Complains of easy bruising.  Neurological: Negative.        Complaint of some balance issues.a  Hematological: Negative.   Psychiatric/Behavioral: Positive for sleep disturbance.       Wakes up after about an hour of sleep and is awake for another 3 hours.    Filed Vitals:   05/30/14 1446  BP: 130/70  Pulse: 65  Temp: 97.9 F (36.6 C)  TempSrc: Oral  Resp: 20  Height: 5\' 5"  (1.651 m)  Weight: 104 lb 12.8 oz (47.537 kg)  SpO2: 96%   Body mass index is 17.44 kg/(m^2).  Physical Exam  Constitutional: She is oriented to person, place, and time. She appears well-developed and well-nourished. No distress.  HENT:  Head: Normocephalic and atraumatic.  Right Ear: External ear normal.  Left Ear: External ear normal.  Nose: Nose normal.  Mouth/Throat: Oropharynx is clear and moist.  Eyes: Conjunctivae and EOM are normal.  Pupils are equal, round, and reactive to light.  Corrective lenses.  Neck: Normal range of motion. Neck supple. No JVD present. No tracheal deviation present. No thyromegaly present.  Cardiovascular: Normal rate and regular rhythm.   Murmur heard. Pulmonary/Chest: Effort normal. No respiratory distress. She has no wheezes. She has rales (right lower lobe). She exhibits no tenderness.  Abdominal: Soft. Bowel sounds are normal. She exhibits no distension and no mass. There is no tenderness.  Musculoskeletal: Normal range of motion. She exhibits no edema or tenderness.  Lymphadenopathy:    She has no cervical adenopathy.  Neurological: She is alert and oriented to person, place, and time. She has normal reflexes. No cranial nerve deficit. Coordination normal.   Intact vibratory. 11/30/13 MMSE 26/30. Passed clock drawing.  Skin: Skin is warm and dry. She is not diaphoretic. No erythema. No pallor.  Psychiatric: She has a normal mood and affect. Her behavior is normal. Judgment and thought content normal.     Labs reviewed: No visits with results within 3 Month(s) from this visit. Latest known visit with results is:  Appointment on 11/28/2013  Component Date Value Ref Range Status  . Glucose 11/28/2013 84  65 - 99 mg/dL Final  . BUN 11/28/2013 27  8 - 27 mg/dL Final  . Creatinine, Ser 11/28/2013 1.60* 0.57 - 1.00 mg/dL Final  . GFR calc non Af Amer 11/28/2013 31* >59 mL/min/1.73 Final  . GFR calc Af Amer 11/28/2013 35* >59 mL/min/1.73 Final  . BUN/Creatinine Ratio 11/28/2013 17  11 - 26 Final  . Sodium 11/28/2013 140  134 - 144 mmol/L Final  . Potassium 11/28/2013 4.6  3.5 - 5.2 mmol/L Final  . Chloride 11/28/2013 101  97 - 108 mmol/L Final  . CO2 11/28/2013 23  18 - 29 mmol/L Final  . Calcium 11/28/2013 9.4  8.7 - 10.3 mg/dL Final  . Total Protein 11/28/2013 6.5  6.0 - 8.5 g/dL Final  . Albumin 11/28/2013 3.7  3.5 - 4.8 g/dL Final  . Globulin, Total 11/28/2013 2.8  1.5 - 4.5 g/dL Final  . Albumin/Globulin Ratio 11/28/2013 1.3  1.1 - 2.5 Final  . Total Bilirubin 11/28/2013 0.4  0.0 - 1.2 mg/dL Final  . Alkaline Phosphatase 11/28/2013 79  39 - 117 IU/L Final  . AST 11/28/2013 13  0 - 40 IU/L Final  . ALT 11/28/2013 14  0 - 32 IU/L Final  . Cholesterol, Total 11/28/2013 159  100 - 199 mg/dL Final  . Triglycerides 11/28/2013 78  0 - 149 mg/dL Final  . HDL 11/28/2013 57  >39 mg/dL Final   Comment: According to ATP-III Guidelines, HDL-C >59 mg/dL is considered a negative risk factor for CHD.   Marland Kitchen VLDL Cholesterol Cal 11/28/2013 16  5 - 40 mg/dL Final  . LDL Calculated 11/28/2013 86  0 - 99 mg/dL Final  . Chol/HDL Ratio 11/28/2013 2.8  0.0 - 4.4 ratio units Final   Comment:                                   T. Chol/HDL Ratio                                              Men  Women  1/2 Avg.Risk  3.4    3.3                                   Avg.Risk  5.0    4.4                                2X Avg.Risk  9.6    7.1                                3X Avg.Risk 23.4   11.0      Assessment/Plan  1. Palpitations - metoprolol (LOPRESSOR) 50 MG tablet; One every 8 hours if needed for palpitations  Dispense: 180 tablet; Refill: 3  2. Essential hypertension controlled - Basic metabolic panel; Future  3. Cough improved  4. Hyperlipidemia - Lipid panel; Future  5. Hyperglycemia - Basic metabolic panel; Future

## 2014-07-17 ENCOUNTER — Other Ambulatory Visit: Payer: Self-pay | Admitting: Internal Medicine

## 2014-08-10 ENCOUNTER — Encounter: Payer: Self-pay | Admitting: Nurse Practitioner

## 2014-08-10 ENCOUNTER — Ambulatory Visit (INDEPENDENT_AMBULATORY_CARE_PROVIDER_SITE_OTHER): Payer: Medicare Other | Admitting: Nurse Practitioner

## 2014-08-10 VITALS — BP 118/80 | HR 70 | Temp 97.0°F | Resp 18 | Ht 65.0 in | Wt 103.4 lb

## 2014-08-10 DIAGNOSIS — J209 Acute bronchitis, unspecified: Secondary | ICD-10-CM | POA: Diagnosis not present

## 2014-08-10 MED ORDER — DOXYCYCLINE HYCLATE 100 MG PO TABS: 100.0000 mg | ORAL_TABLET | Freq: Two times a day (BID) | ORAL | Status: AC

## 2014-08-10 NOTE — Progress Notes (Signed)
Patient ID: Syerra Biskner, female   DOB: 07/26/1932, 81 y.o.   MRN: [id number]    PCP: Estill Dooms, MD  Allergies  Allergen Reactions  . Penicillins     Chief Complaint  Patient presents with  . Acute Visit     HPI: Patient is a 81 y.o. female seen in the office today cough and congestion for 2 weeks.  Has yellow greenish sputum, has tired tussin DM with good effect but has not improved overall.  Congestion in chest No fever or shortness of breath.  Nonsmoker Feels like it is a little better today  Review of Systems:  Review of Systems  Constitutional: Positive for fatigue (and malaise but more energy today). Negative for activity change, appetite change and unexpected weight change.  HENT: Positive for postnasal drip, rhinorrhea and sneezing. Negative for congestion, hearing loss and sore throat.   Respiratory: Positive for cough. Negative for shortness of breath.   Cardiovascular: Negative for chest pain and leg swelling.  Gastrointestinal: Negative for diarrhea and constipation.  Allergic/Immunologic: Negative for environmental allergies.  Neurological: Negative for dizziness and weakness.    Past Medical History  Diagnosis Date  . Hyperlipidemia   . Cough   . Hearing loss   . Acute bronchitis   . Thrombocytopenia, unspecified   . Macular degeneration (senile) of retina, unspecified   . Senile cataract, unspecified   . Impacted cerumen   . Chronic kidney disease, stage II (mild)   . Nonspecific abnormal electrocardiogram (ECG) (EKG)   . Other abnormal blood chemistry   . Hypertension   . Other emphysema   . Nonspecific abnormal electrocardiogram (ECG) (EKG)   . Encounter for long-term (current) use of other medications   . Mitral valve disorders   . Osteoporosis, unspecified   . Chronic ischemic heart disease, unspecified   . Other malaise and fatigue   . Unspecified hearing loss   . Ganglion, unspecified   . Benign neoplasm of uterus, part  unspecified   . Palpitations    Past Surgical History  Procedure Laterality Date  . Knee arthroscopy      right   Social History:   reports that she has never smoked. She does not have any smokeless tobacco history on file. She reports that she does not drink alcohol or use illicit drugs.  Family History  Problem Relation Age of Onset  . Stroke Brother   . Cancer Sister     lung  . Parkinsonism Father     Alzheimer's    Medications: Patient's Medications  New Prescriptions   No medications on file  Previous Medications   ACETAMINOPHEN (TYLENOL) 500 MG TABLET    Take 500 mg by mouth every 6 (six) hours as needed.   ASPIRIN 81 MG TABLET    Take 81 mg by mouth daily.     ATORVASTATIN (LIPITOR) 10 MG TABLET    TAKE 1 TABLET ONCE DAILY TO LOWER CHOLESTEROL.   AZITHROMYCIN (ZITHROMAX) 250 MG TABLET    Take as directed   CALCIUM-VITAMIN D PO    Take by mouth 2 (two) times daily.     LOSARTAN (COZAAR) 50 MG TABLET    One daily to lowwer BP   MULTIPLE VITAMIN (MULTIVITAMIN) CAPSULE    Take 1 capsule by mouth daily.     NON FORMULARY    2 (two) times daily. occu support tabs bid   POLYETHYL GLYCOL-PROPYL GLYCOL (SYSTANE OP)    Apply to eye daily.  TIOTROPIUM (SPIRIVA HANDIHALER) 18 MCG INHALATION CAPSULE    Place 1 capsule (18 mcg total) into inhaler and inhale 1 day or 1 dose.  Modified Medications   No medications on file  Discontinued Medications   METOPROLOL (LOPRESSOR) 50 MG TABLET    One every 8 hours if needed for palpitations     Physical Exam:  Filed Vitals:   08/10/14 1146  BP: 118/80  Pulse: 70  Temp: 97 F (36.1 C)  TempSrc: Oral  Resp: 18  Height: 5\' 5"  (1.651 m)  Weight: 103 lb 6.4 oz (46.902 kg)  SpO2: 95%    Physical Exam  Constitutional: She is oriented to person, place, and time. She appears well-developed and well-nourished. No distress.  HENT:  Head: Normocephalic and atraumatic.  Right Ear: External ear normal.  Left Ear: External ear  normal.  Nose: Nose normal.  Mouth/Throat: Oropharynx is clear and moist. No oropharyngeal exudate.  Eyes: Conjunctivae are normal. Pupils are equal, round, and reactive to light.  Neck: Normal range of motion. Neck supple.  Cardiovascular: Normal rate, regular rhythm and normal heart sounds.   Pulmonary/Chest: Effort normal and breath sounds normal.  Lymphadenopathy:    She has no cervical adenopathy.  Neurological: She is alert and oriented to person, place, and time.  Skin: Skin is warm and dry. She is not diaphoretic.    Labs reviewed: Basic Metabolic Panel:  Recent Labs  11/28/13 0810  NA 140  K 4.6  CL 101  CO2 23  GLUCOSE 84  BUN 27  CREATININE 1.60*  CALCIUM 9.4   Liver Function Tests:  Recent Labs  11/28/13 0810  AST 13  ALT 14  ALKPHOS 79  BILITOT 0.4  PROT 6.5   No results for input(s): LIPASE, AMYLASE in the last 8760 hours. No results for input(s): AMMONIA in the last 8760 hours. CBC: No results for input(s): WBC, NEUTROABS, HGB, HCT, MCV, PLT in the last 8760 hours. Lipid Panel:  Recent Labs  11/28/13 0810  CHOL 159  HDL 57  LDLCALC 86  TRIG 78  CHOLHDL 2.8   TSH: No results for input(s): TSH in the last 8760 hours. A1C: No results found for: HGBA1C   Assessment/Plan 1. Acute bronchitis, unspecified organism -lasting for ~2 weeks with cough and congestion. Improved today. Still feeling fatigue but has improved.  -cont supportive care - doxycycline (VIBRA-TABS) 100 MG tablet; Take 1 tablet (100 mg total) by mouth 2 (two) times daily.  Dispense: 14 tablet; Refill: 0 Rx given to fill if symptoms do not cont to improved    Joshaua Epple K. Harle Battiest  [facility] [phone] 8 am - 5 pm) [phone] (after hours)

## 2014-08-10 NOTE — Patient Instructions (Signed)
Cont tussin DM as needed for cough and congestion Increase water intake  To start doxycycline 100 mg twice daily for 1 week if no improvement   Acute Bronchitis Bronchitis is inflammation of the airways that extend from the windpipe into the lungs (bronchi). The inflammation often causes mucus to develop. This leads to a cough, which is the most common symptom of bronchitis.  In acute bronchitis, the condition usually develops suddenly and goes away over time, usually in a couple weeks. Smoking, allergies, and asthma can make bronchitis worse. Repeated episodes of bronchitis may cause further lung problems.  CAUSES Acute bronchitis is most often caused by the same virus that causes a cold. The virus can spread from person to person (contagious) through coughing, sneezing, and touching contaminated objects. SIGNS AND SYMPTOMS   Cough.   Fever.   Coughing up mucus.   Body aches.   Chest congestion.   Chills.   Shortness of breath.   Sore throat.  DIAGNOSIS  Acute bronchitis is usually diagnosed through a physical exam. Your health care provider will also ask you questions about your medical history. Tests, such as chest X-rays, are sometimes done to rule out other conditions.  TREATMENT  Acute bronchitis usually goes away in a couple weeks. Oftentimes, no medical treatment is necessary. Medicines are sometimes given for relief of fever or cough. Antibiotic medicines are usually not needed but may be prescribed in certain situations. In some cases, an inhaler may be recommended to help reduce shortness of breath and control the cough. A cool mist vaporizer may also be used to help thin bronchial secretions and make it easier to clear the chest.  HOME CARE INSTRUCTIONS  Get plenty of rest.   Drink enough fluids to keep your urine clear or pale yellow (unless you have a medical condition that requires fluid restriction). Increasing fluids may help thin your respiratory  secretions (sputum) and reduce chest congestion, and it will prevent dehydration.   Take medicines only as directed by your health care provider.  If you were prescribed an antibiotic medicine, finish it all even if you start to feel better.  Avoid smoking and secondhand smoke. Exposure to cigarette smoke or irritating chemicals will make bronchitis worse. If you are a smoker, consider using nicotine gum or skin patches to help control withdrawal symptoms. Quitting smoking will help your lungs heal faster.   Reduce the chances of another bout of acute bronchitis by washing your hands frequently, avoiding people with cold symptoms, and trying not to touch your hands to your mouth, nose, or eyes.   Keep all follow-up visits as directed by your health care provider.  SEEK MEDICAL CARE IF: Your symptoms do not improve after 1 week of treatment.  SEEK IMMEDIATE MEDICAL CARE IF:  You develop an increased fever or chills.   You have chest pain.   You have severe shortness of breath.  You have bloody sputum.   You develop dehydration.  You faint or repeatedly feel like you are going to pass out.  You develop repeated vomiting.  You develop a severe headache. MAKE SURE YOU:   Understand these instructions.  Will watch your condition.  Will get help right away if you are not doing well or get worse. Document Released: 02/14/2004 Document Revised: 05/23/2013 Document Reviewed: 06/29/2012 [facility] Patient Information 2015 [place], [place]. This information is not intended to replace advice given to you by your health care provider. Make sure you discuss any questions you have with  your health care provider.

## 2014-08-21 ENCOUNTER — Other Ambulatory Visit: Payer: Self-pay | Admitting: Internal Medicine

## 2014-11-21 LAB — HM DEXA SCAN

## 2014-11-21 LAB — HM MAMMOGRAPHY

## 2014-11-24 ENCOUNTER — Encounter: Payer: Self-pay | Admitting: *Deleted

## 2014-11-24 ENCOUNTER — Other Ambulatory Visit: Payer: Medicare Other

## 2014-11-27 ENCOUNTER — Encounter: Payer: Self-pay | Admitting: *Deleted

## 2014-11-27 ENCOUNTER — Other Ambulatory Visit: Payer: Medicare Other

## 2014-11-28 ENCOUNTER — Ambulatory Visit: Payer: Medicare Other | Admitting: Internal Medicine

## 2014-11-29 ENCOUNTER — Ambulatory Visit: Payer: Medicare Other | Admitting: Internal Medicine

## 2014-12-01 ENCOUNTER — Other Ambulatory Visit: Payer: Medicare Other

## 2014-12-01 DIAGNOSIS — E785 Hyperlipidemia, unspecified: Secondary | ICD-10-CM

## 2014-12-01 DIAGNOSIS — R739 Hyperglycemia, unspecified: Secondary | ICD-10-CM

## 2014-12-01 DIAGNOSIS — I10 Essential (primary) hypertension: Secondary | ICD-10-CM

## 2014-12-02 LAB — BASIC METABOLIC PANEL
BUN/Creatinine Ratio: 13 (ref 11–26)
BUN: 11 mg/dL (ref 8–27)
CO2: 24 mmol/L (ref 18–29)
Calcium: 9 mg/dL (ref 8.7–10.3)
Chloride: 100 mmol/L (ref 97–106)
Creatinine, Ser: 0.86 mg/dL (ref 0.57–1.00)
GFR, EST AFRICAN AMERICAN: 74 mL/min/{1.73_m2} (ref >59)
GFR, EST NON AFRICAN AMERICAN: 64 mL/min/{1.73_m2} (ref >59)
Glucose: 108 mg/dL — ABNORMAL HIGH (ref 65–99)
POTASSIUM: 4.2 mmol/L (ref 3.5–5.2)
SODIUM: 140 mmol/L (ref 136–144)

## 2014-12-02 LAB — LIPID PANEL
CHOL/HDL RATIO: 2.5 ratio (ref 0.0–4.4)
Cholesterol, Total: 179 mg/dL (ref 100–199)
HDL: 72 mg/dL (ref >39)
LDL Calculated: 95 mg/dL (ref 0–99)
Triglycerides: 60 mg/dL (ref 0–149)
VLDL Cholesterol Cal: 12 mg/dL (ref 5–40)

## 2014-12-05 ENCOUNTER — Encounter: Payer: Self-pay | Admitting: Internal Medicine

## 2014-12-06 ENCOUNTER — Encounter: Payer: Self-pay | Admitting: Internal Medicine

## 2014-12-06 ENCOUNTER — Ambulatory Visit (INDEPENDENT_AMBULATORY_CARE_PROVIDER_SITE_OTHER): Payer: Medicare Other | Admitting: Internal Medicine

## 2014-12-06 VITALS — BP 140/90 | HR 72 | Temp 98.1°F | Resp 12 | Ht 65.0 in | Wt 104.0 lb

## 2014-12-06 DIAGNOSIS — D696 Thrombocytopenia, unspecified: Secondary | ICD-10-CM | POA: Diagnosis not present

## 2014-12-06 DIAGNOSIS — Z23 Encounter for immunization: Secondary | ICD-10-CM | POA: Diagnosis not present

## 2014-12-06 DIAGNOSIS — I10 Essential (primary) hypertension: Secondary | ICD-10-CM

## 2014-12-06 DIAGNOSIS — E785 Hyperlipidemia, unspecified: Secondary | ICD-10-CM

## 2014-12-06 DIAGNOSIS — R739 Hyperglycemia, unspecified: Secondary | ICD-10-CM

## 2014-12-06 DIAGNOSIS — R413 Other amnesia: Secondary | ICD-10-CM

## 2014-12-06 DIAGNOSIS — N182 Chronic kidney disease, stage 2 (mild): Secondary | ICD-10-CM | POA: Diagnosis not present

## 2014-12-06 NOTE — Progress Notes (Signed)
Patient ID: Tanetta Fuhriman, female   DOB: 27-Nov-1932, 81 y.o.   MRN: [id number]    Facility  [facility]    Place of Service:   OFFICE    Allergies  Allergen Reactions  . Penicillins     Chief Complaint  Patient presents with  . Medical Management of Chronic Issues    6 month follow-up, discuss labs (copy printed)  . Memory Loss    Patient concerned about memory loss. 28/30, passed clock drawing. Previous MMSE 26/30   . Immunizations    Flu vaccine   . Orders    Discuss need for colonoscopy     HPI:  Essential hypertension - controlled  Hyperlipidemia - controlled  Hyperglycemia - normal glucose when last checked   Memory change - possibly getting worse. Daughter is present today to make sure her mother reports her memory issues. Patient continues to drive and has not gotten lost. She denies significant problems with word finding. She continues to cook and do well with this. She agrees that sometimes her memory is a little slippery, but does not find that it interferes with her today.  Chronic kidney disease, stage II (mild) - stable  Thrombocytopenia, unspecified (HCC) - needs lab follow-up  Encounter for immunization - needs flu vaccine and Prevnar      Medications: Patient's Medications  New Prescriptions   No medications on file  Previous Medications   ACETAMINOPHEN (TYLENOL) 500 MG TABLET    Take 500 mg by mouth every 6 (six) hours as needed.   ASPIRIN 81 MG TABLET    Take 81 mg by mouth daily.     ATORVASTATIN (LIPITOR) 10 MG TABLET    TAKE 1 TABLET ONCE DAILY TO LOWER CHOLESTEROL.   CALCIUM-VITAMIN D PO    Take by mouth 2 (two) times daily.     LOSARTAN (COZAAR) 50 MG TABLET    TAKE 1 TABLET DAILY TO CONTROL BLOOD PRESSURE.   MULTIPLE VITAMIN (MULTIVITAMIN) CAPSULE    Take 1 capsule by mouth daily.     NON FORMULARY    2 (two) times daily. occu support tabs bid  Modified Medications   No medications on file  Discontinued Medications   POLYETHYL  GLYCOL-PROPYL GLYCOL (SYSTANE OP)    Apply to eye daily.     TIOTROPIUM (SPIRIVA HANDIHALER) 18 MCG INHALATION CAPSULE    Place 1 capsule (18 mcg total) into inhaler and inhale 1 day or 1 dose.    Review of Systems  Constitutional: Positive for fatigue. Negative for fever, chills, activity change and appetite change.  HENT: Negative for congestion and rhinorrhea.   Eyes: Negative.   Respiratory: Positive for cough (mild dry cough). Negative for chest tightness and shortness of breath.   Cardiovascular: Positive for palpitations.  Gastrointestinal: Negative.  Negative for nausea, vomiting, abdominal pain, constipation and abdominal distention.  Endocrine: Negative.   Musculoskeletal: Positive for gait problem.  Skin: Negative.        Complains of easy bruising.  Neurological:       Complaint of some balance issues. Reports mild cognitive issues.  Hematological: Negative.   Psychiatric/Behavioral: Positive for sleep disturbance.       Wakes up after about an hour of sleep and is awake for another 3 hours.    Filed Vitals:   12/06/14 1522  BP: 140/90  Pulse: 72  Temp: 98.1 F (36.7 C)  TempSrc: Oral  Resp: 12  Height: 5' 5"  (1.651 m)  Weight: 104 lb (47.174 kg)  SpO2: 94%   Body mass index is 17.31 kg/(m^2).  Physical Exam  Constitutional: She is oriented to person, place, and time. She appears well-developed and well-nourished. No distress.  HENT:  Head: Normocephalic and atraumatic.  Right Ear: External ear normal.  Left Ear: External ear normal.  Nose: Nose normal.  Mouth/Throat: Oropharynx is clear and moist.  Eyes: Conjunctivae and EOM are normal. Pupils are equal, round, and reactive to light.  Corrective lenses.  Neck: Normal range of motion. Neck supple. No JVD present. No tracheal deviation present. No thyromegaly present.  Cardiovascular: Normal rate and regular rhythm.   Murmur heard. Pulmonary/Chest: Effort normal. No respiratory distress. She has no wheezes.  She has rales (right lower lobe). She exhibits no tenderness.  Abdominal: Soft. Bowel sounds are normal. She exhibits no distension and no mass. There is no tenderness.  Musculoskeletal: Normal range of motion. She exhibits no edema or tenderness.  Lymphadenopathy:    She has no cervical adenopathy.  Neurological: She is alert and oriented to person, place, and time. She has normal reflexes. No cranial nerve deficit. Coordination normal.  Intact vibratory. 11/30/13 MMSE 26/30. Passed clock drawing. 12/06/14 MMSE 28/30. Passed clock drawing.  Skin: Skin is warm and dry. She is not diaphoretic. No erythema. No pallor.  Psychiatric: She has a normal mood and affect. Her behavior is normal. Judgment and thought content normal.    Labs reviewed: Lab Summary Latest Ref Rng 12/01/2014 11/28/2013 11/19/2012  Hemoglobin 11.1 - 15.9 g/dL (None) (None) 13.2  Hematocrit 34.0 - 46.6 % (None) (None) 39.8  White count 3.4 - 10.8 x10E3/uL (None) (None) 4.7  Platelet count - (None) (None) (None)  Sodium 136 - 144 mmol/L 140 140 141  Potassium 3.5 - 5.2 mmol/L 4.2 4.6 4.4  Calcium 8.7 - 10.3 mg/dL 9.0 9.4 9.5  Phosphorus - (None) (None) (None)  Creatinine 0.57 - 1.00 mg/dL 0.86 1.60(H) 1.50(H)  AST 0 - 40 IU/L (None) 13 14  Alk Phos 39 - 117 IU/L (None) 79 75  Bilirubin 0.0 - 1.2 mg/dL (None) 0.4 0.4  Glucose 65 - 99 mg/dL 108(H) 84 77  Cholesterol - (None) (None) (None)  HDL cholesterol >39 mg/dL 72 57 66  Triglycerides 0 - 149 mg/dL 60 78 61  LDL Direct - (None) (None) (None)  LDL Calc 0 - 99 mg/dL 95 86 92  Total protein - (None) (None) (None)  Albumin 3.5 - 4.8 g/dL (None) 3.7 3.8   Lab Results  Component Value Date   TSH 1.28 04/15/2011   Lab Results  Component Value Date   BUN 11 12/01/2014   No results found for: HGBA1C  Assessment/Plan  1. Essential hypertension Controlled. Continue current medication  2. Hyperlipidemia Controlled. Continue atorvastatin - Lipid panel;  Future  3. Hyperglycemia Last glucose 108. Satisfactory control. - Comprehensive metabolic panel; Future - Hemoglobin A1c; Future  4. Memory change Lengthy discussion about memory issues. She ices scored 2 points better on her MMSE is compared to a year ago. I think it is premature to start medications. Follow-up MMSE in 6 months.  5. Chronic kidney disease, stage II (mild) - Comprehensive metabolic panel; Future  6. Thrombocytopenia, unspecified (HC - CBC With Differential; Future  7. Encounter for immunization Prevnar and flu vaccine

## 2015-01-30 ENCOUNTER — Other Ambulatory Visit: Payer: Self-pay | Admitting: Internal Medicine

## 2015-02-26 ENCOUNTER — Other Ambulatory Visit: Payer: Self-pay | Admitting: Internal Medicine

## 2015-05-28 ENCOUNTER — Other Ambulatory Visit: Payer: Self-pay | Admitting: Internal Medicine

## 2015-06-04 ENCOUNTER — Other Ambulatory Visit: Payer: Medicare Other

## 2015-06-04 DIAGNOSIS — N182 Chronic kidney disease, stage 2 (mild): Secondary | ICD-10-CM

## 2015-06-04 DIAGNOSIS — R739 Hyperglycemia, unspecified: Secondary | ICD-10-CM

## 2015-06-04 DIAGNOSIS — E785 Hyperlipidemia, unspecified: Secondary | ICD-10-CM

## 2015-06-04 DIAGNOSIS — D696 Thrombocytopenia, unspecified: Secondary | ICD-10-CM

## 2015-06-05 LAB — COMPREHENSIVE METABOLIC PANEL
A/G RATIO: 1.2 (ref 1.2–2.2)
ALT: 10 IU/L (ref 0–32)
AST: 14 IU/L (ref 0–40)
Albumin: 3.7 g/dL (ref 3.5–4.7)
Alkaline Phosphatase: 79 IU/L (ref 39–117)
BUN / CREAT RATIO: 22 (ref 12–28)
BUN: 32 mg/dL — ABNORMAL HIGH (ref 8–27)
Bilirubin Total: 0.5 mg/dL (ref 0.0–1.2)
CALCIUM: 9.7 mg/dL (ref 8.7–10.3)
CO2: 24 mmol/L (ref 18–29)
Chloride: 100 mmol/L (ref 96–106)
Creatinine, Ser: 1.46 mg/dL — ABNORMAL HIGH (ref 0.57–1.00)
GFR, EST AFRICAN AMERICAN: 39 mL/min/{1.73_m2} — AB (ref >59)
GFR, EST NON AFRICAN AMERICAN: 34 mL/min/{1.73_m2} — AB (ref >59)
GLUCOSE: 87 mg/dL (ref 65–99)
Globulin, Total: 3.2 g/dL (ref 1.5–4.5)
POTASSIUM: 4.7 mmol/L (ref 3.5–5.2)
SODIUM: 139 mmol/L (ref 134–144)
Total Protein: 6.9 g/dL (ref 6.0–8.5)

## 2015-06-05 LAB — LIPID PANEL
CHOL/HDL RATIO: 2.6 ratio (ref 0.0–4.4)
Cholesterol, Total: 174 mg/dL (ref 100–199)
HDL: 67 mg/dL (ref >39)
LDL CALC: 94 mg/dL (ref 0–99)
Triglycerides: 64 mg/dL (ref 0–149)
VLDL Cholesterol Cal: 13 mg/dL (ref 5–40)

## 2015-06-05 LAB — CBC WITH DIFFERENTIAL
Basophils Absolute: 0 10*3/uL (ref 0.0–0.2)
Basos: 1 %
EOS (ABSOLUTE): 0.1 10*3/uL (ref 0.0–0.4)
EOS: 1 %
HEMATOCRIT: 41.1 % (ref 34.0–46.6)
HEMOGLOBIN: 14 g/dL (ref 11.1–15.9)
Immature Grans (Abs): 0 10*3/uL (ref 0.0–0.1)
Immature Granulocytes: 0 %
LYMPHS ABS: 1.5 10*3/uL (ref 0.7–3.1)
Lymphs: 25 %
MCH: 31.5 pg (ref 26.6–33.0)
MCHC: 34.1 g/dL (ref 31.5–35.7)
MCV: 93 fL (ref 79–97)
MONOCYTES: 9 %
MONOS ABS: 0.5 10*3/uL (ref 0.1–0.9)
NEUTROS ABS: 3.7 10*3/uL (ref 1.4–7.0)
Neutrophils: 64 %
RBC: 4.44 x10E6/uL (ref 3.77–5.28)
RDW: 13.6 % (ref 12.3–15.4)
WBC: 5.8 10*3/uL (ref 3.4–10.8)

## 2015-06-05 LAB — HEMOGLOBIN A1C
Est. average glucose Bld gHb Est-mCnc: 123 mg/dL
Hgb A1c MFr Bld: 5.9 % — ABNORMAL HIGH (ref 4.8–5.6)

## 2015-06-06 ENCOUNTER — Encounter: Payer: Self-pay | Admitting: Internal Medicine

## 2015-06-06 ENCOUNTER — Ambulatory Visit (INDEPENDENT_AMBULATORY_CARE_PROVIDER_SITE_OTHER): Payer: Medicare Other | Admitting: Internal Medicine

## 2015-06-06 VITALS — BP 138/90 | HR 70 | Temp 98.2°F | Ht 65.0 in | Wt 106.0 lb

## 2015-06-06 DIAGNOSIS — N182 Chronic kidney disease, stage 2 (mild): Secondary | ICD-10-CM

## 2015-06-06 DIAGNOSIS — D692 Other nonthrombocytopenic purpura: Secondary | ICD-10-CM | POA: Diagnosis not present

## 2015-06-06 DIAGNOSIS — E785 Hyperlipidemia, unspecified: Secondary | ICD-10-CM

## 2015-06-06 DIAGNOSIS — R413 Other amnesia: Secondary | ICD-10-CM | POA: Diagnosis not present

## 2015-06-06 DIAGNOSIS — R739 Hyperglycemia, unspecified: Secondary | ICD-10-CM | POA: Diagnosis not present

## 2015-06-06 DIAGNOSIS — I10 Essential (primary) hypertension: Secondary | ICD-10-CM

## 2015-06-06 DIAGNOSIS — D696 Thrombocytopenia, unspecified: Secondary | ICD-10-CM

## 2015-06-06 HISTORY — DX: Other nonthrombocytopenic purpura: D69.2

## 2015-06-06 NOTE — Progress Notes (Signed)
Patient ID: Lindsey Salazar, female   DOB: 1932/09/29, 82 y.o.   MRN: [id number]    Facility  [facility]    Place of Service:   OFFICE    Allergies  Allergen Reactions  . Penicillins     Chief Complaint  Patient presents with  . Medical Management of Chronic Issues    6 month medication management, blood pressure, cholesterol, hyperglycemia, memory, thrombocytopenia, review labs   . MMSE    28/30 failed clock drawing    HPI:   Essential hypertension - controlled  Hyperlipidemia - controlled  Hyperglycemia - controlled  Chronic kidney disease, stage II (mild) - some deterioration in the last 6 months, but BUN and creat are the same as 1 year ago.  Memory change - Remains concerned about her memory. Forgot her way to get to [place] last month. 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 She did not tell her family. Has been back there several times since then. Did not get lost. Has had anothere times she forgot how to get to another places. No issues with cooking or keeping her bill pay straight. her father had dementia and Parkinsonism. Sister may be on medication for memory.  Thrombocytopenia, unspecified (HCC) - plt is not being returned on the CBC as ordered    Medications: Patient's Medications  New Prescriptions   No medications on file  Previous Medications   ACETAMINOPHEN (TYLENOL) 500 MG TABLET    Take 500 mg by mouth every 6 (six) hours as needed.   ASPIRIN 81 MG TABLET    Take 81 mg by mouth. Take one tablet every other day   ATORVASTATIN (LIPITOR) 10 MG TABLET    TAKE 1 TABLET ONCE DAILY TO LOWER CHOLESTEROL.   CALCIUM-VITAMIN D PO    Take by mouth 2 (two) times daily.     LOSARTAN (COZAAR) 50 MG TABLET    TAKE 1 TABLET DAILY TO CONTROL BLOOD PRESSURE.   MULTIPLE VITAMIN (MULTIVITAMIN) CAPSULE    Take 1 capsule by mouth daily.     NON FORMULARY    2 (two) times daily. occu support tabs bid  Modified Medications   No medications on file  Discontinued Medications   No  medications on file    Review of Systems  Constitutional: Positive for fatigue. Negative for fever, chills, activity change and appetite change.  HENT: Negative for congestion and rhinorrhea.   Eyes: Negative.   Respiratory: Positive for cough (mild dry cough). Negative for chest tightness and shortness of breath.   Cardiovascular: Positive for palpitations.  Gastrointestinal: Negative.  Negative for nausea, vomiting, abdominal pain, constipation and abdominal distention.  Endocrine: Negative.   Musculoskeletal: Positive for gait problem.  Skin:       Complains of easy bruising.  Neurological:       Complaint of some balance issues. Reports mild cognitive issues.  Psychiatric/Behavioral: Positive for sleep disturbance.       Wakes up after about an hour of sleep and is awake for another 3 hours.    Filed Vitals:   06/06/15 1214  BP: 138/90  Pulse: 70  Temp: 98.2 F (36.8 C)  TempSrc: Oral  Height: 5' 5"  (1.651 m)  Weight: 106 lb (48.081 kg)  SpO2: 95%   Body mass index is 17.64 kg/(m^2). Filed Weights   06/06/15 1214  Weight: 106 lb (48.081 kg)     Physical Exam  Constitutional: She is oriented to person, place, and time. She appears well-developed and well-nourished. No distress.  HENT:  Head:  Normocephalic and atraumatic.  Right Ear: External ear normal.  Left Ear: External ear normal.  Nose: Nose normal.  Mouth/Throat: Oropharynx is clear and moist.  Eyes: Conjunctivae and EOM are normal. Pupils are equal, round, and reactive to light.  Corrective lenses.  Neck: Normal range of motion. Neck supple. No JVD present. No tracheal deviation present. No thyromegaly present.  Cardiovascular: Normal rate and regular rhythm.   Murmur heard. Pulmonary/Chest: Effort normal. No respiratory distress. She has no wheezes. She has rales (right lower lobe). She exhibits no tenderness.  Abdominal: Soft. Bowel sounds are normal. She exhibits no distension and no mass. There is no  tenderness.  Musculoskeletal: Normal range of motion. She exhibits no edema or tenderness.  Lymphadenopathy:    She has no cervical adenopathy.  Neurological: She is alert and oriented to person, place, and time. She has normal reflexes. No cranial nerve deficit. Coordination normal.  Intact vibratory. 11/30/13 MMSE 26/30. Passed clock drawing. 12/06/14 MMSE 28/30. Passed clock drawing. 06/06/15 MMSE 28/30. Passed clock drawing.  Skin: Skin is warm and dry. She is not diaphoretic. No erythema. No pallor.  multilpe purpuric areas  Psychiatric: She has a normal mood and affect. Her behavior is normal. Judgment and thought content normal.    Labs reviewed: Lab Summary Latest Ref Rng 06/04/2015 12/01/2014 11/28/2013  Hemoglobin 11.1 - 15.9 g/dL 14.0 (None) (None)  Hematocrit 34.0 - 46.6 % 41.1 (None) (None)  White count 3.4 - 10.8 x10E3/uL 5.8 (None) (None)  Platelet count - (None) (None) (None)  Sodium 134 - 144 mmol/L 139 140 140  Potassium 3.5 - 5.2 mmol/L 4.7 4.2 4.6  Calcium 8.7 - 10.3 mg/dL 9.7 9.0 9.4  Phosphorus - (None) (None) (None)  Creatinine 0.57 - 1.00 mg/dL 1.46(H) 0.86 1.60(H)  AST 0 - 40 IU/L 14 (None) 13  Alk Phos 39 - 117 IU/L 79 (None) 79  Bilirubin 0.0 - 1.2 mg/dL 0.5 (None) 0.4  Glucose 65 - 99 mg/dL 87 108(H) 84  Cholesterol - (None) (None) (None)  HDL cholesterol >39 mg/dL 67 72 57  Triglycerides 0 - 149 mg/dL 64 60 78  LDL Direct - (None) (None) (None)  LDL Calc 0 - 99 mg/dL 94 95 86  Total protein - (None) (None) (None)  Albumin 3.5 - 4.7 g/dL 3.7 (None) 3.7   Lab Results  Component Value Date   TSH 1.28 04/15/2011   Lab Results  Component Value Date   BUN 32* 06/04/2015   BUN 11 12/01/2014   BUN 27 11/28/2013   Lab Results  Component Value Date   HGBA1C 5.9* 06/04/2015    Assessment/Plan  1. Essential hypertension controlled - Comprehensive metabolic panel; Future  2. Hyperlipidemia - Lipid panel; Future  3. Hyperglycemia -CMP,  future  4. Chronic kidney disease, stage II (mild) -CMP, future  5. Memory change unchanged  6. Thrombocytopenia, unspecified (HCC) -platelet count, future  7. Senile purpura (HCC) - CBC With Differential; Future

## 2015-06-06 NOTE — Patient Instructions (Signed)
See if your sister will tell you what medication she is on for her memory.

## 2015-06-06 NOTE — Progress Notes (Signed)
Patient ID: Lindsey Salazar, female   DOB: 15-Feb-1932, 82 y.o.   MRN: [id number] MMSE 28/30 failed clock drawing

## 2015-08-13 ENCOUNTER — Other Ambulatory Visit: Payer: Self-pay | Admitting: Internal Medicine

## 2015-09-05 ENCOUNTER — Other Ambulatory Visit: Payer: Self-pay | Admitting: Internal Medicine

## 2015-09-21 ENCOUNTER — Other Ambulatory Visit: Payer: Self-pay

## 2015-09-21 DIAGNOSIS — E785 Hyperlipidemia, unspecified: Secondary | ICD-10-CM

## 2015-09-21 DIAGNOSIS — I10 Essential (primary) hypertension: Secondary | ICD-10-CM

## 2015-09-21 DIAGNOSIS — D696 Thrombocytopenia, unspecified: Secondary | ICD-10-CM

## 2015-09-21 DIAGNOSIS — D692 Other nonthrombocytopenic purpura: Secondary | ICD-10-CM

## 2015-09-25 ENCOUNTER — Ambulatory Visit (INDEPENDENT_AMBULATORY_CARE_PROVIDER_SITE_OTHER): Payer: Medicare Other | Admitting: Internal Medicine

## 2015-09-25 ENCOUNTER — Encounter: Payer: Self-pay | Admitting: Internal Medicine

## 2015-09-25 VITALS — BP 132/84 | HR 59 | Temp 97.9°F | Ht 65.0 in | Wt 105.0 lb

## 2015-09-25 DIAGNOSIS — I10 Essential (primary) hypertension: Secondary | ICD-10-CM | POA: Diagnosis not present

## 2015-09-25 DIAGNOSIS — R42 Dizziness and giddiness: Secondary | ICD-10-CM | POA: Diagnosis not present

## 2015-09-25 DIAGNOSIS — R739 Hyperglycemia, unspecified: Secondary | ICD-10-CM | POA: Diagnosis not present

## 2015-09-25 HISTORY — DX: Dizziness and giddiness: R42

## 2015-09-25 LAB — CBC WITH DIFFERENTIAL/PLATELET
BASOS ABS: 0 {cells}/uL (ref 0–200)
Basophils Relative: 0 %
EOS PCT: 1 %
Eosinophils Absolute: 63 cells/uL (ref 15–500)
HCT: 40.3 % (ref 35.0–45.0)
HEMOGLOBIN: 13.5 g/dL (ref 11.7–15.5)
LYMPHS ABS: 1638 {cells}/uL (ref 850–3900)
LYMPHS PCT: 26 %
MCH: 31.3 pg (ref 27.0–33.0)
MCHC: 33.5 g/dL (ref 32.0–36.0)
MCV: 93.3 fL (ref 80.0–100.0)
MONOS PCT: 10 %
MPV: 10.2 fL (ref 7.5–12.5)
Monocytes Absolute: 630 cells/uL (ref 200–950)
NEUTROS PCT: 63 %
Neutro Abs: 3969 cells/uL (ref 1500–7800)
Platelets: 157 10*3/uL (ref 140–400)
RBC: 4.32 MIL/uL (ref 3.80–5.10)
RDW: 13.1 % (ref 11.0–15.0)
WBC: 6.3 10*3/uL (ref 3.8–10.8)

## 2015-09-25 LAB — COMPREHENSIVE METABOLIC PANEL
ALBUMIN: 3.8 g/dL (ref 3.6–5.1)
ALT: 10 U/L (ref 6–29)
AST: 12 U/L (ref 10–35)
Alkaline Phosphatase: 78 U/L (ref 33–130)
BUN: 29 mg/dL — ABNORMAL HIGH (ref 7–25)
CHLORIDE: 102 mmol/L (ref 98–110)
CO2: 24 mmol/L (ref 20–31)
CREATININE: 1.61 mg/dL — AB (ref 0.60–0.88)
Calcium: 9.7 mg/dL (ref 8.6–10.4)
Glucose, Bld: 88 mg/dL (ref 65–99)
Potassium: 5.1 mmol/L (ref 3.5–5.3)
SODIUM: 140 mmol/L (ref 135–146)
Total Bilirubin: 0.5 mg/dL (ref 0.2–1.2)
Total Protein: 7 g/dL (ref 6.1–8.1)

## 2015-09-25 NOTE — Progress Notes (Signed)
Facility  [facility]    Place of Service:   OFFICE    Allergies  Allergen Reactions  . Penicillins     Chief Complaint  Patient presents with  . Acute Visit    light headedness, dizzy, gotten worse lately. [place] about 3 weeks ago, legs got week. Today while walking had 3 "spells" of light headedness and had to stop walking. Did not fall. Here with husband    HPI:  Acute visit to evaluate recurrent spells of lightheadedness and dizziness. Has never had CT brain scan. No recent changes in medications.  Legs feel shakey and wobbly at times. BP has been running normal. No visual change. No palpitations.  Denies dyspnea. No paresthesias. No nausea. Has a sensation that she is not thinking clearly, but this is not necessarily associate with the light headed feelings. Episodes only last a few seconds.  Medications: Patient's Medications  New Prescriptions   No medications on file  Previous Medications   ACETAMINOPHEN (TYLENOL) 500 MG TABLET    Take 500 mg by mouth every 6 (six) hours as needed.   ASPIRIN 81 MG TABLET    Take 81 mg by mouth. Take one tablet every other day   ATORVASTATIN (LIPITOR) 10 MG TABLET    TAKE 1 TABLET ONCE DAILY TO LOWER CHOLESTEROL.   CALCIUM-VITAMIN D PO    Take by mouth 2 (two) times daily.     LOSARTAN (COZAAR) 50 MG TABLET    TAKE 1 TABLET DAILY TO CONTROL BLOOD PRESSURE.   METOPROLOL (LOPRESSOR) 50 MG TABLET    Take 50 mg by mouth. Take one tablet every 8 hours as needed for palpitations   MULTIPLE VITAMIN (MULTIVITAMIN) CAPSULE    Take 1 capsule by mouth daily.     NON FORMULARY    2 (two) times daily. occu support tabs bid  Modified Medications   No medications on file  Discontinued Medications   No medications on file    Review of Systems  Constitutional: Positive for fatigue. Negative for activity change, appetite change, chills and fever.  HENT: Negative for congestion and rhinorrhea.   Eyes: Negative.   Respiratory: Positive for cough (mild dry  cough). Negative for chest tightness and shortness of breath.   Cardiovascular: Positive for palpitations.  Gastrointestinal: Negative.  Negative for abdominal distention, abdominal pain, constipation, nausea and vomiting.  Endocrine: Negative.   Musculoskeletal: Positive for gait problem.  Skin:       Complains of easy bruising.  Neurological:       Complaint of some balance issues and falls.  Reports mild cognitive issues. Recurrent episodes of lightheadedness that have been associated with falls.  Psychiatric/Behavioral: Positive for sleep disturbance.       Wakes up after about an hour of sleep and is awake for another 3 hours.    Vitals:   09/25/15 1511  BP: 132/84  Pulse: (!) 59  Temp: 97.9 F (36.6 C)  TempSrc: Oral  SpO2: 98%  Weight: 105 lb (47.6 kg)  Height: 5' 5"  (1.651 m)   Body mass index is 17.47 kg/m. Wt Readings from Last 3 Encounters:  09/25/15 105 lb (47.6 kg)  06/06/15 106 lb (48.1 kg)  12/06/14 104 lb (47.2 kg)      Physical Exam  Constitutional: She is oriented to person, place, and time. She appears well-developed and well-nourished. No distress.  HENT:  Head: Normocephalic and atraumatic.  Right Ear: External ear normal.  Left Ear: External ear normal.  Nose: Nose normal.  Mouth/Throat: Oropharynx is clear and moist.  Eyes: Conjunctivae and EOM are normal. Pupils are equal, round, and reactive to light.  Corrective lenses.  Neck: Normal range of motion. Neck supple. No JVD present. No tracheal deviation present. No thyromegaly present.  Cardiovascular: Normal rate and regular rhythm.   Murmur heard. Pulmonary/Chest: Effort normal. No respiratory distress. She has no wheezes. She has rales (right lower lobe). She exhibits no tenderness.  Abdominal: Soft. Bowel sounds are normal. She exhibits no distension and no mass. There is no tenderness.  Musculoskeletal: Normal range of motion. She exhibits no edema or tenderness.  Lymphadenopathy:     She has no cervical adenopathy.  Neurological: She is alert and oriented to person, place, and time. She has normal reflexes. No cranial nerve deficit. Coordination normal.  Intact vibratory. 11/30/13 MMSE 26/30. Passed clock drawing. 12/06/14 MMSE 28/30. Passed clock drawing. 06/06/15 MMSE 28/30. Passed clock drawing.  Skin: Skin is warm and dry. She is not diaphoretic. No erythema. No pallor.  multilpe purpuric areas  Psychiatric: She has a normal mood and affect. Her behavior is normal. Judgment and thought content normal.    Labs reviewed: Lab Summary Latest Ref Rng & Units 06/04/2015 12/01/2014 11/28/2013  Hemoglobin 11.1 - 15.9 g/dL 14.0 (None) (None)  Hematocrit 34.0 - 46.6 % 41.1 (None) (None)  White count 3.4 - 10.8 x10E3/uL 5.8 (None) (None)  Platelet count - (None) (None) (None)  Sodium 134 - 144 mmol/L 139 140 140  Potassium 3.5 - 5.2 mmol/L 4.7 4.2 4.6  Calcium 8.7 - 10.3 mg/dL 9.7 9.0 9.4  Phosphorus - (None) (None) (None)  Creatinine 0.57 - 1.00 mg/dL 1.46(H) 0.86 1.60(H)  AST 0 - 40 IU/L 14 (None) 13  Alk Phos 39 - 117 IU/L 79 (None) 79  Bilirubin 0.0 - 1.2 mg/dL 0.5 (None) 0.4  Glucose 65 - 99 mg/dL 87 108(H) 84  Cholesterol - (None) (None) (None)  HDL cholesterol >39 mg/dL 67 72 57  Triglycerides 0 - 149 mg/dL 64 60 78  LDL Direct - (None) (None) (None)  LDL Calc 0 - 99 mg/dL 94 95 86  Total protein - (None) (None) (None)  Albumin 3.5 - 4.7 g/dL 3.7 (None) 3.7  Some recent data might be hidden   Lab Results  Component Value Date   TSH 1.28 04/15/2011   Lab Results  Component Value Date   BUN 32 (H) 06/04/2015   BUN 11 12/01/2014   BUN 27 11/28/2013   Lab Results  Component Value Date   HGBA1C 5.9 (H) 06/04/2015    Assessment/Plan  1. Light-headed feeling -EKG: normal - CBC with Differential/Platelet - Comprehensive metabolic panel - TSH - Holter monitor - 24 hour; Future  2. Essential hypertension cpontrolled - Comprehensive metabolic  panel  3. Hyperglycemia - Comprehensive metabolic panel

## 2015-09-26 LAB — TSH: TSH: 2.02 mIU/L

## 2015-09-26 NOTE — Addendum Note (Signed)
Addended by: Ripley Fraise on: 09/26/2015 11:51 AM   Modules accepted: Orders

## 2015-10-15 ENCOUNTER — Ambulatory Visit (INDEPENDENT_AMBULATORY_CARE_PROVIDER_SITE_OTHER): Payer: Medicare Other

## 2015-10-15 DIAGNOSIS — R42 Dizziness and giddiness: Secondary | ICD-10-CM

## 2015-11-23 ENCOUNTER — Encounter: Payer: Self-pay | Admitting: *Deleted

## 2015-11-23 LAB — HM MAMMOGRAPHY

## 2015-12-07 ENCOUNTER — Other Ambulatory Visit: Payer: Medicare Other

## 2015-12-07 DIAGNOSIS — I10 Essential (primary) hypertension: Secondary | ICD-10-CM

## 2015-12-07 DIAGNOSIS — E785 Hyperlipidemia, unspecified: Secondary | ICD-10-CM

## 2015-12-07 LAB — COMPLETE METABOLIC PANEL WITH GFR
ALT: 10 U/L (ref 6–29)
AST: 13 U/L (ref 10–35)
Albumin: 3.6 g/dL (ref 3.6–5.1)
Alkaline Phosphatase: 74 U/L (ref 33–130)
BUN: 26 mg/dL — AB (ref 7–25)
CHLORIDE: 101 mmol/L (ref 98–110)
CO2: 27 mmol/L (ref 20–31)
Calcium: 9.5 mg/dL (ref 8.6–10.4)
Creat: 1.41 mg/dL — ABNORMAL HIGH (ref 0.60–0.88)
GFR, EST NON AFRICAN AMERICAN: 35 mL/min — AB (ref >=60)
GFR, Est African American: 41 mL/min — ABNORMAL LOW (ref >=60)
GLUCOSE: 84 mg/dL (ref 65–99)
POTASSIUM: 4.6 mmol/L (ref 3.5–5.3)
SODIUM: 136 mmol/L (ref 135–146)
Total Bilirubin: 0.7 mg/dL (ref 0.2–1.2)
Total Protein: 6.8 g/dL (ref 6.1–8.1)

## 2015-12-07 LAB — LIPID PANEL
CHOL/HDL RATIO: 2.4 ratio (ref <5.0)
Cholesterol: 161 mg/dL (ref <200)
HDL: 68 mg/dL (ref >50)
LDL CALC: 79 mg/dL (ref <100)
Triglycerides: 70 mg/dL (ref <150)
VLDL: 14 mg/dL (ref <30)

## 2015-12-11 ENCOUNTER — Encounter: Payer: Self-pay | Admitting: Internal Medicine

## 2015-12-11 ENCOUNTER — Ambulatory Visit (INDEPENDENT_AMBULATORY_CARE_PROVIDER_SITE_OTHER): Payer: Medicare Other | Admitting: Internal Medicine

## 2015-12-11 VITALS — BP 160/98 | HR 68 | Temp 98.1°F | Ht 65.0 in | Wt 104.0 lb

## 2015-12-11 DIAGNOSIS — E785 Hyperlipidemia, unspecified: Secondary | ICD-10-CM | POA: Diagnosis not present

## 2015-12-11 DIAGNOSIS — I10 Essential (primary) hypertension: Secondary | ICD-10-CM | POA: Diagnosis not present

## 2015-12-11 DIAGNOSIS — R413 Other amnesia: Secondary | ICD-10-CM | POA: Diagnosis not present

## 2015-12-11 DIAGNOSIS — N182 Chronic kidney disease, stage 2 (mild): Secondary | ICD-10-CM

## 2015-12-11 DIAGNOSIS — R42 Dizziness and giddiness: Secondary | ICD-10-CM | POA: Diagnosis not present

## 2015-12-11 DIAGNOSIS — R739 Hyperglycemia, unspecified: Secondary | ICD-10-CM

## 2015-12-11 MED ORDER — DONEPEZIL HCL 10 MG PO TABS: ORAL_TABLET | ORAL | 3 refills | Status: DC

## 2015-12-11 NOTE — Progress Notes (Signed)
Facility  [facility]    Place of Service:   OFFICE    Allergies  Allergen Reactions  . Penicillins     Chief Complaint  Patient presents with  . Medical Management of Chronic Issues    6 month medication management blood pressure, cholesterol, hyperglycemia, throbocytopenia, review labs. Here with Lindsey Salazar   . Memory Loss    has noticed in pass 6 months memory getting worse, also getting confused, not sure were she is going while driving.   Marland Kitchen MMSE    26/30 passed clock drawing    HPI:  Essential hypertension - elevated in office, but home BP for the last 2 months has been normal with few exceptions. No headache.  Chest pain, or significant palpitations.  Hyperglycemia - controlled  Hyperlipidemia, unspecified hyperlipidemia type - controlled  Chronic kidney disease, stage II (mild) - stable  Memory change - she is very concerned about continuing deterioration.  Light-headed feeling - improved    Medications: Patient's Medications  New Prescriptions   No medications on file  Previous Medications   ACETAMINOPHEN (TYLENOL) 500 MG TABLET    Take 500 mg by mouth every 6 (six) hours as needed.   ASPIRIN 81 MG TABLET    Take 81 mg by mouth. Take one tablet every other day   ATORVASTATIN (LIPITOR) 10 MG TABLET    TAKE 1 TABLET ONCE DAILY TO LOWER CHOLESTEROL.   CALCIUM-VITAMIN D PO    Take by mouth 2 (two) times daily.     LOSARTAN (COZAAR) 50 MG TABLET    TAKE 1 TABLET DAILY TO CONTROL BLOOD PRESSURE.   METOPROLOL (LOPRESSOR) 50 MG TABLET    Take 50 mg by mouth. Take one tablet every 8 hours as needed for palpitations   MULTIPLE VITAMIN (MULTIVITAMIN) CAPSULE    Take 1 capsule by mouth daily.     NON FORMULARY    2 (two) times daily. occu support tabs bid  Modified Medications   No medications on file  Discontinued Medications   No medications on file    Review of Systems  Constitutional: Positive for fatigue. Negative for activity change, appetite change, chills and  fever.  HENT: Negative for congestion and rhinorrhea.   Eyes: Negative.   Respiratory: Positive for cough (mild dry cough). Negative for chest tightness and shortness of breath.   Cardiovascular: Positive for palpitations.  Gastrointestinal: Negative.  Negative for abdominal distention, abdominal pain, constipation, nausea and vomiting.  Endocrine: Negative.   Musculoskeletal: Positive for gait problem.  Skin:       Complains of easy bruising.  Neurological:       Complaint of some balance issues and falls.  Reports mild cognitive issues. Recurrent episodes of lightheadedness that have been associated with falls.  Psychiatric/Behavioral: Positive for sleep disturbance.       Wakes up after about an hour of sleep and is awake for another 3 hours.    Vitals:   12/11/15 1401 12/11/15 1444  BP: (!) 158/96 (!) 160/98  Pulse: 68   Temp: 98.1 F (36.7 C)   TempSrc: Oral   SpO2: 98%   Weight: 104 lb (47.2 kg)   Height: 5' 5"  (1.651 m)    Body mass index is 17.31 kg/m. Wt Readings from Last 3 Encounters:  12/11/15 104 lb (47.2 kg)  09/25/15 105 lb (47.6 kg)  06/06/15 106 lb (48.1 kg)      Physical Exam  Constitutional: She is oriented to person, place, and time. She  appears well-developed and well-nourished. No distress.  HENT:  Head: Normocephalic and atraumatic.  Right Ear: External ear normal.  Left Ear: External ear normal.  Nose: Nose normal.  Mouth/Throat: Oropharynx is clear and moist.  Eyes: Conjunctivae and EOM are normal. Pupils are equal, round, and reactive to light.  Corrective lenses.  Neck: Normal range of motion. Neck supple. No JVD present. No tracheal deviation present. No thyromegaly present.  Cardiovascular: Normal rate and regular rhythm.   Murmur heard. Pulmonary/Chest: Effort normal. No respiratory distress. She has no wheezes. She has rales (right lower lobe). She exhibits no tenderness.  Abdominal: Soft. Bowel sounds are normal. She exhibits no  distension and no mass. There is no tenderness.  Musculoskeletal: Normal range of motion. She exhibits no edema or tenderness.  Lymphadenopathy:    She has no cervical adenopathy.  Neurological: She is alert and oriented to person, place, and time. She has normal reflexes. No cranial nerve deficit. Coordination normal.  Intact vibratory. 11/30/13 MMSE 26/30. Passed clock drawing. 12/06/14 MMSE 28/30. Passed clock drawing. 06/06/15 MMSE 28/30. Passed clock drawing. 12/11/15 MMSE 26/30. Passed clock drawing.  Skin: Skin is warm and dry. She is not diaphoretic. No erythema. No pallor.  multilpe purpuric areas  Psychiatric: She has a normal mood and affect. Her behavior is normal. Judgment and thought content normal.    Labs reviewed: Lab Summary Latest Ref Rng & Units 12/07/2015 09/25/2015 06/04/2015  Hemoglobin 11.7 - 15.5 g/dL (None) 13.5 14.0  Hematocrit 35.0 - 45.0 % (None) 40.3 41.1  White count 3.8 - 10.8 K/uL (None) 6.3 5.8  Platelet count 140 - 400 K/uL (None) 157 (None)  Sodium 135 - 146 mmol/L 136 140 139  Potassium 3.5 - 5.3 mmol/L 4.6 5.1 4.7  Calcium 8.6 - 10.4 mg/dL 9.5 9.7 9.7  Phosphorus - (None) (None) (None)  Creatinine 0.60 - 0.88 mg/dL 1.41(H) 1.61(H) 1.46(H)  AST 10 - 35 U/L 13 12 14   Alk Phos 33 - 130 U/L 74 78 79  Bilirubin 0.2 - 1.2 mg/dL 0.7 0.5 0.5  Glucose 65 - 99 mg/dL 84 88 87  Cholesterol <200 mg/dL 161 (None) (None)  HDL cholesterol >50 mg/dL 68 (None) 67  Triglycerides <150 mg/dL 70 (None) 64  LDL Direct - (None) (None) (None)  LDL Calc <100 mg/dL 79 (None) 94  Total protein 6.1 - 8.1 g/dL 6.8 7.0 (None)  Albumin 3.6 - 5.1 g/dL 3.6 3.8 3.7  Some recent data might be hidden   Lab Results  Component Value Date   TSH 2.02 09/25/2015   TSH 1.28 04/15/2011   Lab Results  Component Value Date   BUN 26 (H) 12/07/2015   BUN 29 (H) 09/25/2015   BUN 32 (H) 06/04/2015   Lab Results  Component Value Date   HGBA1C 5.9 (H) 06/04/2015     Assessment/Plan  1. Essential hypertension Continue current medications  2. Hyperglycemia Controlled  3. Hyperlipidemia, unspecified hyperlipidemia type controlled  4. Chronic kidney disease, stage II (mild) unchanged  5. Memory change -start 5 mg daily of donepezil for 28 days, then increase dose to - donepezil (ARICEPT) 10 MG tablet; One daily to help preserve memory  Dispense: 90 tablet; Refill: 3  6. Light-headed feeling improved

## 2016-01-02 ENCOUNTER — Encounter: Payer: Medicare Other | Admitting: Internal Medicine

## 2016-01-22 ENCOUNTER — Ambulatory Visit (INDEPENDENT_AMBULATORY_CARE_PROVIDER_SITE_OTHER): Payer: Medicare Other | Admitting: Urgent Care

## 2016-01-22 VITALS — BP 138/76 | HR 80 | Temp 97.6°F | Resp 18 | Ht 65.0 in | Wt 107.0 lb

## 2016-01-22 DIAGNOSIS — S51811A Laceration without foreign body of right forearm, initial encounter: Secondary | ICD-10-CM | POA: Diagnosis not present

## 2016-01-22 MED ORDER — DOXYCYCLINE HYCLATE 100 MG PO CAPS: 100.0000 mg | ORAL_CAPSULE | Freq: Two times a day (BID) | ORAL | 0 refills | Status: DC

## 2016-01-22 NOTE — Progress Notes (Signed)
    MRN: [id number] DOB: 16-Oct-1932  Subjective:   Lindsey Salazar is a 83 y.o. female presenting for chief complaint of Fall (right arm bleeding)  Patient fell backwards today, lost her balance and slipped. She broke her fall with her right forearm. When she stood up, she noticed that she was bleeding profusely. She wrapped her arm up and came straight to our clinic. Her last Tdap was 11/25/2012. Denies loss of range of motion, numbness, tingling, bony deformity.   Lindsey Salazar has a current medication list which includes the following prescription(s): acetaminophen, aspirin, atorvastatin, calcium-vitamin d, donepezil, losartan, metoprolol, multivitamin, NON FORMULARY, and doxycycline. Also is allergic to penicillins.  Lindsey Salazar  has a past medical history of Acute bronchitis; Benign neoplasm of uterus, part unspecified; Chronic ischemic heart disease, unspecified; Chronic kidney disease, stage II (mild); Cough; Encounter for long-term (current) use of other medications; Ganglion, unspecified; Hearing loss; Hyperlipidemia; Hypertension; Impacted cerumen; Light-headed feeling (09/25/2015); Macular degeneration (senile) of retina, unspecified; Mitral valve disorders(424.0); Nonspecific abnormal electrocardiogram (ECG) (EKG); Nonspecific abnormal electrocardiogram (ECG) (EKG); Osteoporosis, unspecified; Other abnormal blood chemistry; Other emphysema ([place]); Other malaise and fatigue; Palpitations; Senile cataract, unspecified; Senile purpura ([place]) (06/06/2015); Thrombocytopenia, unspecified; and Unspecified hearing loss. Also  has a past surgical history that includes Knee arthroscopy.  Objective:   Vitals: BP 138/76 (BP Location: Right Arm, Patient Position: Sitting, Cuff Size: Small)   Pulse 80   Temp 97.6 F (36.4 C) (Oral)   Resp 18   Ht 5\' 5"  (1.651 m)   Wt 107 lb (48.5 kg)   SpO2 99%   BMI 17.81 kg/m   Physical Exam  Constitutional: She is oriented to person, place, and time. She appears  well-developed and well-nourished.  Cardiovascular: Normal rate.   Pulmonary/Chest: Effort normal.  Musculoskeletal:       Right wrist: She exhibits normal range of motion, no tenderness, no bony tenderness, no swelling, no effusion, no crepitus, no deformity and no laceration.       Right upper arm: She exhibits no tenderness, no bony tenderness, no swelling, no edema, no deformity and no laceration.       Right forearm: She exhibits tenderness (over wound) and laceration. She exhibits no bony tenderness, no swelling, no edema and no deformity.       Arms: Neurological: She is alert and oriented to person, place, and time. No cranial nerve deficit. Coordination normal.   PROCEDURE NOTE: laceration repair Verbal consent obtained from patient.  Local anesthesia with 15cc Lidocaine 1% with epinephrine.  Wound explored for tendon, ligament damage. Wound scrubbed with soap and water and rinsed. Wound closed with #12 4-0 Ethilon (horizontal mattress, simple interrupted) sutures.  Wound cleansed and dressed.   Assessment and Plan :   1. Laceration of right forearm, initial encounter - Wound care reviewed. Patient will start doxycycline for 1 week to cover given depth and size of wound. She will recheck in 2 days with me, otherwise, suture removal in 10 days.  Jaynee Eagles, PA-C [facility] [phone] 01/22/2016 3:32 PM

## 2016-01-22 NOTE — Patient Instructions (Addendum)
Laceration Care, Adult A laceration is a cut that goes through all of the layers of the skin and into the tissue that is right under the skin. Some lacerations heal on their own. Others need to be closed with stitches (sutures), staples, skin adhesive strips, or skin glue. Proper laceration care minimizes the risk of infection and helps the laceration to heal better. How is this treated? If sutures or staples were used:  Keep the wound clean and dry.  If you were given a bandage (dressing), you should change it at least one time per day or as told by your health care provider. You should also change it if it becomes wet or dirty.  Keep the wound completely dry for the first 24 hours or as told by your health care provider. After that time, you may shower or bathe. However, make sure that the wound is not soaked in water until after the sutures or staples have been removed.  Clean the wound one time each day or as told by your health care provider:  Wash the wound with soap and water.  Rinse the wound with water to remove all soap.  Pat the wound dry with a clean towel. Do not rub the wound.  After cleaning the wound, apply a thin layer of antibiotic ointmentas told by your health care provider. This will help to prevent infection and keep the dressing from sticking to the wound.  Have the sutures or staples removed as told by your health care provider. If skin adhesive strips were used:  Keep the wound clean and dry.  If you were given a bandage (dressing), you should change it at least one time per day or as told by your health care provider. You should also change it if it becomes dirty or wet.  Do not get the skin adhesive strips wet. You may shower or bathe, but be careful to keep the wound dry.  If the wound gets wet, pat it dry with a clean towel. Do not rub the wound.  Skin adhesive strips fall off on their own. You may trim the strips as the wound heals. Do not remove skin  adhesive strips that are still stuck to the wound. They will fall off in time. If skin glue was used:  Try to keep the wound dry, but you may briefly wet it in the shower or bath. Do not soak the wound in water, such as by swimming.  After you have showered or bathed, gently pat the wound dry with a clean towel. Do not rub the wound.  Do not do any activities that will make you sweat heavily until the skin glue has fallen off on its own.  Do not apply liquid, cream, or ointment medicine to the wound while the skin glue is in place. Using those may loosen the film before the wound has healed.  If you were given a bandage (dressing), you should change it at least one time per day or as told by your health care provider. You should also change it if it becomes dirty or wet.  If a dressing is placed over the wound, be careful not to apply tape directly over the skin glue. Doing that may cause the glue to be pulled off before the wound has healed.  Do not pick at the glue. The skin glue usually remains in place for 5-10 days, then it falls off of the skin. General Instructions  Take over-the-counter and prescription medicines only  as told by your health care provider.  If you were prescribed an antibiotic medicine or ointment, take or apply it as told by your doctor. Do not stop using it even if your condition improves.  To help prevent scarring, make sure to cover your wound with sunscreen whenever you are outside after stitches are removed, after adhesive strips are removed, or when glue remains in place and the wound is healed. Make sure to wear a sunscreen of at least 30 SPF.  Do not scratch or pick at the wound.  Keep all follow-up visits as told by your health care provider. This is important.  Check your wound every day for signs of infection. Watch for:  Redness, swelling, or pain.  Fluid, blood, or pus.  Raise (elevate) the injured area above the level of your heart while you  are sitting or lying down, if possible. Contact a health care provider if:  You received a tetanus shot and you have swelling, severe pain, redness, or bleeding at the injection site.  You have a fever.  A wound that was closed breaks open.  You notice a bad smell coming from your wound or your dressing.  You notice something coming out of the wound, such as wood or glass.  Your pain is not controlled with medicine.  You have increased redness, swelling, or pain at the site of your wound.  You have fluid, blood, or pus coming from your wound.  You notice a change in the color of your skin near your wound.  You need to change the dressing frequently due to fluid, blood, or pus draining from the wound.  You develop a new rash.  You develop numbness around the wound. Get help right away if:  You develop severe swelling around the wound.  Your pain suddenly increases and is severe.  You develop painful lumps near the wound or on skin that is anywhere on your body.  You have a red streak going away from your wound.  The wound is on your hand or foot and you cannot properly move a finger or toe.  The wound is on your hand or foot and you notice that your fingers or toes look pale or bluish. This information is not intended to replace advice given to you by your health care provider. Make sure you discuss any questions you have with your health care provider. Document Released: 01/06/2005 Document Revised: 06/08/2015 Document Reviewed: 01/02/2014 Elsevier Interactive Patient Education  2017 [employer].     IF you received an x-ray today, you will receive an invoice from [facility]. Please contact [facility] at [phone] with questions or concerns regarding your invoice.   IF you received labwork today, you will receive an invoice from [facility]. Please contact [facility] at [phone] with questions or concerns regarding your invoice.   Our billing  staff will not be able to assist you with questions regarding bills from these companies.  You will be contacted with the lab results as soon as they are available. The fastest way to get your results is to activate your My Chart account. Instructions are located on the last page of this paperwork. If you have not heard from [place] regarding the results in 2 weeks, please contact this office.

## 2016-01-24 ENCOUNTER — Ambulatory Visit (INDEPENDENT_AMBULATORY_CARE_PROVIDER_SITE_OTHER): Payer: Medicare Other | Admitting: Urgent Care

## 2016-01-24 VITALS — BP 140/80 | HR 65 | Temp 97.9°F | Resp 14 | Ht 65.0 in | Wt 106.4 lb

## 2016-01-24 DIAGNOSIS — S51811D Laceration without foreign body of right forearm, subsequent encounter: Secondary | ICD-10-CM

## 2016-01-24 NOTE — Patient Instructions (Signed)
     IF you received an x-ray today, you will receive an invoice from [facility]. Please contact [facility] at [phone] with questions or concerns regarding your invoice.   IF you received labwork today, you will receive an invoice from [facility]. Please contact [facility] at [phone] with questions or concerns regarding your invoice.   Our billing staff will not be able to assist you with questions regarding bills from these companies.  You will be contacted with the lab results as soon as they are available. The fastest way to get your results is to activate your My Chart account. Instructions are located on the last page of this paperwork. If you have not heard from us regarding the results in 2 weeks, please contact this office.     

## 2016-01-24 NOTE — Progress Notes (Signed)
    MRN: [id number] DOB: August 25, 1932  Subjective:   Lindsey Salazar is a 83 y.o. female presenting for follow up on forearm laceration. Patient is tolerating Keflex well. She removed the dressing yesterday. Denies fever, redness, pain, drainage of pus or bleeding.  [name] has a current medication list which includes the following prescription(s): acetaminophen, aspirin, atorvastatin, calcium-vitamin d, donepezil, doxycycline, losartan, metoprolol, multivitamin, and NON FORMULARY. Also is allergic to penicillins.  [name]  has a past medical history of Acute bronchitis; Benign neoplasm of uterus, part unspecified; Chronic ischemic heart disease, unspecified; Chronic kidney disease, stage II (mild); Cough; Encounter for long-term (current) use of other medications; Ganglion, unspecified; Hearing loss; Hyperlipidemia; Hypertension; Impacted cerumen; Light-headed feeling (09/25/2015); Macular degeneration (senile) of retina, unspecified; Mitral valve disorders(424.0); Nonspecific abnormal electrocardiogram (ECG) (EKG); Nonspecific abnormal electrocardiogram (ECG) (EKG); Osteoporosis, unspecified; Other abnormal blood chemistry; Other emphysema ([place]); Other malaise and fatigue; Palpitations; Senile cataract, unspecified; Senile purpura ([place]) (06/06/2015); Thrombocytopenia, unspecified; and Unspecified hearing loss. Also  has a past surgical history that includes Knee arthroscopy.  Objective:   Vitals: BP 140/80   Pulse 65   Temp 97.9 F (36.6 C) (Oral)   Resp 14   Ht 5\' 5"  (1.651 m)   Wt 106 lb 6.4 oz (48.3 kg)   SpO2 95%   BMI 17.71 kg/m   Physical Exam  Constitutional: She is oriented to person, place, and time. She appears well-developed and well-nourished.  Cardiovascular: Normal rate.   Pulmonary/Chest: Effort normal.  Musculoskeletal:       Arms: Neurological: She is alert and oriented to person, place, and time.  Skin: Skin is warm and dry.   Assessment and Plan :   1.  Laceration of right forearm, subsequent encounter - Patient doing very well. Counseled on signs and symptoms of infection. She will rtc if this develops. She can use APAP PRN. Plans on rtc in 1 week for suture removal.  Jaynee Eagles, PA-C [facility] [phone] 01/24/2016 1:47 PM

## 2016-01-27 ENCOUNTER — Encounter (HOSPITAL_BASED_OUTPATIENT_CLINIC_OR_DEPARTMENT_OTHER): Payer: Self-pay | Admitting: *Deleted

## 2016-01-27 ENCOUNTER — Emergency Department (HOSPITAL_BASED_OUTPATIENT_CLINIC_OR_DEPARTMENT_OTHER)
Admission: EM | Admit: 2016-01-27 | Discharge: 2016-01-27 | Disposition: A | Discharge status: Home / Self Care | Payer: Medicare Other | Attending: Emergency Medicine | Admitting: Emergency Medicine

## 2016-01-27 ENCOUNTER — Emergency Department (HOSPITAL_BASED_OUTPATIENT_CLINIC_OR_DEPARTMENT_OTHER): Payer: Medicare Other

## 2016-01-27 DIAGNOSIS — Y92010 Kitchen of single-family (private) house as the place of occurrence of the external cause: Secondary | ICD-10-CM | POA: Diagnosis not present

## 2016-01-27 DIAGNOSIS — S3219XA Other fracture of sacrum, initial encounter for closed fracture: Secondary | ICD-10-CM | POA: Insufficient documentation

## 2016-01-27 DIAGNOSIS — Y9301 Activity, walking, marching and hiking: Secondary | ICD-10-CM | POA: Insufficient documentation

## 2016-01-27 DIAGNOSIS — N390 Urinary tract infection, site not specified: Secondary | ICD-10-CM | POA: Insufficient documentation

## 2016-01-27 DIAGNOSIS — S3992XA Unspecified injury of lower back, initial encounter: Secondary | ICD-10-CM | POA: Diagnosis present

## 2016-01-27 DIAGNOSIS — S322XXA Fracture of coccyx, initial encounter for closed fracture: Secondary | ICD-10-CM

## 2016-01-27 DIAGNOSIS — N182 Chronic kidney disease, stage 2 (mild): Secondary | ICD-10-CM | POA: Insufficient documentation

## 2016-01-27 DIAGNOSIS — Z79899 Other long term (current) drug therapy: Secondary | ICD-10-CM | POA: Insufficient documentation

## 2016-01-27 DIAGNOSIS — S3210XA Unspecified fracture of sacrum, initial encounter for closed fracture: Secondary | ICD-10-CM

## 2016-01-27 DIAGNOSIS — Z7982 Long term (current) use of aspirin: Secondary | ICD-10-CM | POA: Diagnosis not present

## 2016-01-27 DIAGNOSIS — Y999 Unspecified external cause status: Secondary | ICD-10-CM | POA: Diagnosis not present

## 2016-01-27 DIAGNOSIS — I129 Hypertensive chronic kidney disease with stage 1 through stage 4 chronic kidney disease, or unspecified chronic kidney disease: Secondary | ICD-10-CM | POA: Insufficient documentation

## 2016-01-27 DIAGNOSIS — W1839XA Other fall on same level, initial encounter: Secondary | ICD-10-CM | POA: Insufficient documentation

## 2016-01-27 DIAGNOSIS — W19XXXA Unspecified fall, initial encounter: Secondary | ICD-10-CM

## 2016-01-27 LAB — URINALYSIS, ROUTINE W REFLEX MICROSCOPIC
Bilirubin Urine: NEGATIVE
GLUCOSE, UA: NEGATIVE mg/dL
Hgb urine dipstick: NEGATIVE
KETONES UR: NEGATIVE mg/dL
NITRITE: POSITIVE — AB
PH: 7 (ref 5.0–8.0)
Protein, ur: NEGATIVE mg/dL
SPECIFIC GRAVITY, URINE: 1.012 (ref 1.005–1.030)

## 2016-01-27 LAB — BASIC METABOLIC PANEL
ANION GAP: 7 (ref 5–15)
BUN: 32 mg/dL — AB (ref 6–20)
CHLORIDE: 104 mmol/L (ref 101–111)
CO2: 27 mmol/L (ref 22–32)
Calcium: 9.4 mg/dL (ref 8.9–10.3)
Creatinine, Ser: 1.45 mg/dL — ABNORMAL HIGH (ref 0.44–1.00)
GFR calc Af Amer: 38 mL/min — ABNORMAL LOW (ref >60)
GFR, EST NON AFRICAN AMERICAN: 33 mL/min — AB (ref >60)
GLUCOSE: 99 mg/dL (ref 65–99)
POTASSIUM: 4.6 mmol/L (ref 3.5–5.1)
Sodium: 138 mmol/L (ref 135–145)

## 2016-01-27 LAB — CBC WITH DIFFERENTIAL/PLATELET
BASOS ABS: 0 10*3/uL (ref 0.0–0.1)
Basophils Relative: 0 %
Eosinophils Absolute: 0.1 10*3/uL (ref 0.0–0.7)
Eosinophils Relative: 1 %
HEMATOCRIT: 40.9 % (ref 36.0–46.0)
HEMOGLOBIN: 13 g/dL (ref 12.0–15.0)
Lymphocytes Relative: 12 %
Lymphs Abs: 1.2 10*3/uL (ref 0.7–4.0)
MCH: 31 pg (ref 26.0–34.0)
MCHC: 31.8 g/dL (ref 30.0–36.0)
MCV: 97.4 fL (ref 78.0–100.0)
Monocytes Absolute: 0.5 10*3/uL (ref 0.1–1.0)
Monocytes Relative: 6 %
NEUTROS ABS: 7.7 10*3/uL (ref 1.7–7.7)
NEUTROS PCT: 81 %
Platelets: 165 10*3/uL (ref 150–400)
RBC: 4.2 MIL/uL (ref 3.87–5.11)
RDW: 13.2 % (ref 11.5–15.5)
WBC: 9.6 10*3/uL (ref 4.0–10.5)

## 2016-01-27 LAB — URINALYSIS, MICROSCOPIC (REFLEX): RBC / HPF: NONE SEEN RBC/hpf (ref 0–5)

## 2016-01-27 LAB — TROPONIN I: Troponin I: <0.03 ng/mL (ref <0.03)

## 2016-01-27 MED ORDER — CEPHALEXIN 250 MG PO CAPS
500.0000 mg | ORAL_CAPSULE | Freq: Once | ORAL | Status: AC
  Administered 2016-01-27: 500 mg via ORAL
  Filled 2016-01-27: qty 2

## 2016-01-27 MED ORDER — CEPHALEXIN 500 MG PO CAPS: 500.0000 mg | ORAL_CAPSULE | Freq: Three times a day (TID) | ORAL | 0 refills | Status: AC

## 2016-01-27 NOTE — ED Triage Notes (Signed)
Pt reports this morning she fell after becoming lightheaded. States she's been evaluated by PCP for intermittent lightheadedness. States she landed directly on her tailbone on hardwood floor. Denies LOC, hitting head, n/v. Pt has stiches on her R forearm from a fall she sustained this past Tuesday. Family at bedside and reports possible dementia.

## 2016-01-27 NOTE — ED Provider Notes (Signed)
[place] [facility] Provider Note   CSN: [id number] Arrival date & time: 01/27/16  0759     History   Chief Complaint Chief Complaint  Patient presents with  . Fall    HPI Lindsey Salazar is a 83 y.o. female.  The history is provided by the patient.  Fall  This is a new problem. The current episode started 1 to 2 hours ago. The problem occurs every several days. The problem has not changed since onset.Pertinent negatives include no chest pain, no abdominal pain and no shortness of breath. Associated symptoms comments: Pain of center of bottom. Nothing aggravates the symptoms. Nothing relieves the symptoms. She has tried nothing for the symptoms.    Past Medical History:  Diagnosis Date  . Acute bronchitis   . Benign neoplasm of uterus, part unspecified   . Chronic ischemic heart disease, unspecified   . Chronic kidney disease, stage II (mild)   . Cough   . Encounter for long-term (current) use of other medications   . Ganglion, unspecified   . Hearing loss   . Hyperlipidemia   . Hypertension   . Impacted cerumen   . Light-headed feeling 09/25/2015  . Macular degeneration (senile) of retina, unspecified   . Mitral valve disorders(424.0)   . Nonspecific abnormal electrocardiogram (ECG) (EKG)   . Nonspecific abnormal electrocardiogram (ECG) (EKG)   . Osteoporosis, unspecified   . Other abnormal blood chemistry   . Other emphysema ([place])   . Other malaise and fatigue   . Palpitations   . Senile cataract, unspecified   . Senile purpura ([place]) 06/06/2015  . Thrombocytopenia, unspecified   . Unspecified hearing loss     Patient Active Problem List   Diagnosis Date Noted  . Light-headed feeling 09/25/2015  . Senile purpura ([place]) 06/06/2015  . Palpitations 05/30/2014  . Memory change 11/30/2013  . Hyperglycemia 11/23/2012  . Chronic kidney disease, stage II (mild) 05/18/2012  . Thrombocytopenia, unspecified 05/18/2012  . Mitral valve disorder 05/18/2012  .  Senile cataract, unspecified 05/18/2012  . Abnormal EKG 04/15/2011  . HTN (hypertension) 04/15/2011  . Hyperlipidemia     Past Surgical History:  Procedure Laterality Date  . KNEE ARTHROSCOPY     right    OB History    No data available       Home Medications    Prior to Admission medications   Medication Sig Start Date End Date Taking? Authorizing Provider  acetaminophen (TYLENOL) 500 MG tablet Take 500 mg by mouth every 6 (six) hours as needed.   Yes Historical Provider, MD  aspirin 81 MG tablet Take 81 mg by mouth. Take one tablet every other day   Yes Historical Provider, MD  atorvastatin (LIPITOR) 10 MG tablet TAKE 1 TABLET ONCE DAILY TO LOWER CHOLESTEROL. 08/13/15  Yes Estill Dooms, MD  CALCIUM-VITAMIN D PO Take by mouth 2 (two) times daily.     Yes Historical Provider, MD  donepezil (ARICEPT) 10 MG tablet One daily to help preserve memory 12/11/15  Yes Estill Dooms, MD  doxycycline (VIBRAMYCIN) 100 MG capsule Take 1 capsule (100 mg total) by mouth 2 (two) times daily. 01/22/16  Yes Jaynee Eagles, PA-C  losartan (COZAAR) 50 MG tablet TAKE 1 TABLET DAILY TO CONTROL BLOOD PRESSURE. 09/05/15  Yes Estill Dooms, MD  metoprolol (LOPRESSOR) 50 MG tablet Take 50 mg by mouth. Take one tablet every 8 hours as needed for palpitations   Yes Historical Provider, MD  Multiple Vitamin (MULTIVITAMIN) capsule  Take 1 capsule by mouth daily.     Yes Historical Provider, MD  NON FORMULARY 2 (two) times daily. occu support tabs bid   Yes Historical Provider, MD    Family History Family History  Problem Relation Age of Onset  . Stroke Brother   . Cancer Sister     lung  . Parkinsonism Father     Alzheimer's    Social History Social History  Substance Use Topics  . Smoking status: Never Smoker  . Smokeless tobacco: Never Used  . Alcohol use No     Allergies   Penicillins   Review of Systems Review of Systems  Respiratory: Negative for shortness of breath.   Cardiovascular:  Negative for chest pain.  Gastrointestinal: Negative for abdominal pain.  All other systems reviewed and are negative.    Physical Exam Updated Vital Signs BP 156/96 (BP Location: Left Arm)   Pulse (!) 43   Resp 18   Ht 5\' 5"  (1.651 m)   Wt 106 lb (48.1 kg)   SpO2 100%   BMI 17.64 kg/m   Physical Exam  Constitutional: She is oriented to person, place, and time. She appears well-developed and well-nourished. No distress.  HENT:  Head: Normocephalic.  Nose: Nose normal.  Eyes: Conjunctivae are normal. Pupils are equal, round, and reactive to light.  Neck: Neck supple. No tracheal deviation present.  Cardiovascular: Normal rate, regular rhythm and normal heart sounds.   Pulmonary/Chest: Effort normal and breath sounds normal. No respiratory distress.  Abdominal: Soft. She exhibits no distension. There is no tenderness. There is no guarding.  Musculoskeletal: Normal range of motion. She exhibits tenderness (mild coccygeal without ecchymosis). She exhibits no deformity.  Neurological: She is alert and oriented to person, place, and time. No cranial nerve deficit or sensory deficit. Coordination normal.  Normal finger to nose testing and rapid alternating movement   Skin: Skin is warm and dry. Capillary refill takes less than 2 seconds.  Psychiatric: She has a normal mood and affect. Her behavior is normal.  Vitals reviewed.    ED Treatments / Results  Labs (all labs ordered are listed, but only abnormal results are displayed) Labs Reviewed  BASIC METABOLIC PANEL - Abnormal; Notable for the following:       Result Value   BUN 32 (*)    Creatinine, Ser 1.45 (*)    GFR calc non Af Amer 33 (*)    GFR calc Af Amer 38 (*)    All other components within normal limits  URINALYSIS, ROUTINE W REFLEX MICROSCOPIC - Abnormal; Notable for the following:    Nitrite POSITIVE (*)    Leukocytes, UA SMALL (*)    All other components within normal limits  URINALYSIS, MICROSCOPIC (REFLEX) -  Abnormal; Notable for the following:    Bacteria, UA MANY (*)    Squamous Epithelial / LPF 0-5 (*)    All other components within normal limits  URINE CULTURE  CBC WITH DIFFERENTIAL/PLATELET  TROPONIN I    EKG  EKG Interpretation  Date/Time:  Sunday January 27 2016 08:31:11 EST Ventricular Rate:  49 PR Interval:    QRS Duration: 81 QT Interval:  478 QTC Calculation: 432 R Axis:   72 Text Interpretation:  Sinus bradycardia ST elevation, consider early repolarization, pericarditis, or injury Since last tracing rate slower Confirmed by Kelbi Renstrom MD, Daelyn Pettaway ([id number]) on 01/27/2016 8:56:32 AM       Radiology No results found.  Procedures Procedures (including critical care time)  Medications  Ordered in ED Medications - No data to display   Initial Impression / Assessment and Plan / ED Course  I have reviewed the triage vital signs and the nursing notes.  Pertinent labs & imaging results that were available during my care of the patient were reviewed by me and considered in my medical decision making (see chart for details).  Clinical Course    83 year old female presents after fall from standing at home while walking to the kitchen. She states that she was standing from a chair and walked forward, lost her balance somewhat and when down onto her bottom. She does have some very low midline pain that is mild, she has ambulated without difficulty since the event, her husband was able to help her up and she is accompanied by family here who are concerned about falls she sustained a laceration 5 days ago while walking in the bathroom. She takes Lopressor 50 mg as needed for what is described as a feeling of palpitations but has never been diagnosed with arrhythmia or refractory hypertension that would warrant continuing this medication in this clinical setting of multiple falls.  No evidence of serious injuries noted, screening lab work performed. Distal nondisplaced sacral fx just proximal  to coccyx will require supportive care only as Pt has no neurologic deficits. No significant hematologic or metabolic abnormalities to explain symptoms. Her heart rate is low in the setting of beta blockade which may have precipitated this event but there is no indication for emergent reversal. Early repolarization on EKG without troponin elevation, doubt atypical ACS presentation. Family is highly concerned about finding a reason for why she sustained the falls. I explained that in this setting rule out emergent medical conditions or serious injury and she will need to follow up with her primary care physician and I recommended that they obtain a walker or other assistance device interim help with fall prevention. I also recommended obtaining referral to physical therapy to help with gait training.  Workup is remarkable for likely UTI which may explain gait difficulty. Will treat empirically with keflex pending culture. Plan to follow up with PCP as needed and return precautions discussed for worsening or new concerning symptoms.   Final Clinical Impressions(s) / ED Diagnoses   Final diagnoses:  Fall from standing, initial encounter  Contusion of coccyx, initial encounter    New Prescriptions New Prescriptions   No medications on file     Leo Grosser, MD 01/27/16 1011

## 2016-01-27 NOTE — ED Notes (Signed)
Pt reports she took her PRN Lopressor around 0500 d/t feeling heart palpitations.

## 2016-01-27 NOTE — ED Notes (Signed)
ED Provider at bedside. 

## 2016-01-29 LAB — URINE CULTURE

## 2016-01-30 ENCOUNTER — Encounter: Payer: Self-pay | Admitting: Internal Medicine

## 2016-01-30 ENCOUNTER — Ambulatory Visit (INDEPENDENT_AMBULATORY_CARE_PROVIDER_SITE_OTHER): Payer: Medicare Other | Admitting: Internal Medicine

## 2016-01-30 ENCOUNTER — Telehealth (HOSPITAL_BASED_OUTPATIENT_CLINIC_OR_DEPARTMENT_OTHER): Payer: Self-pay | Admitting: *Deleted

## 2016-01-30 VITALS — BP 152/100 | HR 63 | Temp 97.7°F | Ht 65.0 in | Wt 106.0 lb

## 2016-01-30 DIAGNOSIS — S51809A Unspecified open wound of unspecified forearm, initial encounter: Secondary | ICD-10-CM | POA: Insufficient documentation

## 2016-01-30 DIAGNOSIS — R55 Syncope and collapse: Secondary | ICD-10-CM

## 2016-01-30 DIAGNOSIS — S3210XA Unspecified fracture of sacrum, initial encounter for closed fracture: Secondary | ICD-10-CM

## 2016-01-30 DIAGNOSIS — S322XXA Fracture of coccyx, initial encounter for closed fracture: Secondary | ICD-10-CM

## 2016-01-30 DIAGNOSIS — I10 Essential (primary) hypertension: Secondary | ICD-10-CM | POA: Diagnosis not present

## 2016-01-30 DIAGNOSIS — S51801D Unspecified open wound of right forearm, subsequent encounter: Secondary | ICD-10-CM

## 2016-01-30 DIAGNOSIS — R413 Other amnesia: Secondary | ICD-10-CM | POA: Diagnosis not present

## 2016-01-30 DIAGNOSIS — W19XXXA Unspecified fall, initial encounter: Secondary | ICD-10-CM | POA: Diagnosis not present

## 2016-01-30 HISTORY — DX: Unspecified fall, initial encounter: W19.XXXA

## 2016-01-30 NOTE — Telephone Encounter (Signed)
Post ED Visit - Positive Culture Follow-up  Culture report reviewed by antimicrobial stewardship pharmacist:  []  Elenor Quinones, Pharm.D. []  Heide Guile, Pharm.D., BCPS []  Parks Neptune, Pharm.D. []  Alycia Rossetti, Pharm.D., BCPS []  [place], Pharm.D., BCPS, AAHIVP []  Legrand Como, Pharm.D., BCPS, AAHIVP []  Milus Glazier, Pharm.D. []  Stephens November, Pharm.D. Maggie Shuda, Pharm D  Positive urine culture Treated with Cephalexin, organism sensitive to the same and no further patient follow-up is required at this time.  Harlon Flor Talley 01/30/2016, 10:12 AM

## 2016-01-30 NOTE — Progress Notes (Signed)
  Facility  [facility]    Place of Service:   OFFICE    Allergies  Allergen Reactions  . Penicillins     Chief Complaint  Patient presents with  . Medical Management of Chronic Issues    has fallen twice in last 5 days. On 01/22/16 lost  balance went to [facility] sutures in right arm. On 01/27/16 passed ou went to ED. Here with husband, daughter Jenny, daughter-in-law Wilma.     HPI:  Falls - 2 in the last 5 days. Injured 01/22/16 with right forearm laceration that required sutures. Had sacral fracture on 01/27/16 after another fall. Was also treated with Keflex for UTI. E. Coli on culture. CBC and BMP were OK except BUN 32 and creatinine 1.41 (stable over the last 8 months).  Husband states she was unconscious after the 2nd fall for at least 20-30 seconds and then seemed confused and unsteady for another couple of minutes.  She had used metoprolol in the morning of the day she had her 2nd fall due to rapid palpitaions. EKG showed rate of 49.  BP running a little high in ER and again today.  Weight stable. No recent change in medications.  Medications: Patient's Medications  New Prescriptions   No medications on file  Previous Medications   ACETAMINOPHEN (TYLENOL) 500 MG TABLET    Take 500 mg by mouth every 6 (six) hours as needed.   ASPIRIN 81 MG TABLET    Take 81 mg by mouth. Take one tablet every other day   ATORVASTATIN (LIPITOR) 10 MG TABLET    TAKE 1 TABLET ONCE DAILY TO LOWER CHOLESTEROL.   CALCIUM-VITAMIN D PO    Take by mouth 2 (two) times daily.     CEPHALEXIN (KEFLEX) 500 MG CAPSULE    Take 1 capsule (500 mg total) by mouth 3 (three) times daily.   DONEPEZIL (ARICEPT) 10 MG TABLET    One daily to help preserve memory   LOSARTAN (COZAAR) 50 MG TABLET    TAKE 1 TABLET DAILY TO CONTROL BLOOD PRESSURE.   METOPROLOL (LOPRESSOR) 50 MG TABLET    Take 50 mg by mouth. Take one tablet every 8 hours as needed for palpitations   MULTIPLE VITAMIN (MULTIVITAMIN) CAPSULE    Take 1  capsule by mouth daily.     NON FORMULARY    2 (two) times daily. occu support tabs bid  Modified Medications   No medications on file  Discontinued Medications   DOXYCYCLINE (VIBRAMYCIN) 100 MG CAPSULE    Take 1 capsule (100 mg total) by mouth 2 (two) times daily.    Review of Systems  Constitutional: Positive for fatigue. Negative for activity change, appetite change, chills and fever.  HENT: Negative for congestion and rhinorrhea.   Eyes:       Some deterioration ii vision recently according to report from her ophth.  Respiratory: Positive for cough (mild dry cough). Negative for chest tightness and shortness of breath.   Cardiovascular: Positive for palpitations.  Gastrointestinal: Negative.  Negative for abdominal distention, abdominal pain, constipation, nausea and vomiting.  Endocrine: Negative.   Musculoskeletal: Positive for gait problem.       Fx sacrum 01/27/16.  Skin: Positive for wound (right forearm laceration).       Complains of easy bruising.  Neurological:       Complaint of some balance issues and falls.  Reports mild cognitive issues. Recurrent episodes of lightheadedness that have been associated with falls.  Psychiatric/Behavioral:   Positive for sleep disturbance.       Wakes up after about an hour of sleep and is awake for another 3 hours.    Vitals:   01/30/16 1241  BP: (!) 152/100  Pulse: 63  Temp: 97.7 F (36.5 C)  TempSrc: Oral  SpO2: 97%  Weight: 106 lb (48.1 kg)  Height: 5' 5" (1.651 m)   Body mass index is 17.64 kg/m. Wt Readings from Last 3 Encounters:  01/30/16 106 lb (48.1 kg)  01/27/16 106 lb (48.1 kg)  01/24/16 106 lb 6.4 oz (48.3 kg)    Physical Exam  Constitutional: She is oriented to person, place, and time. She appears well-developed and well-nourished. No distress.  HENT:  Head: Normocephalic and atraumatic.  Right Ear: External ear normal.  Left Ear: External ear normal.  Nose: Nose normal.  Mouth/Throat: Oropharynx is clear  and moist.  Eyes: Conjunctivae and EOM are normal. Pupils are equal, round, and reactive to light.  Corrective lenses.  Neck: Normal range of motion. Neck supple. No JVD present. No tracheal deviation present. No thyromegaly present.  Cardiovascular: Normal rate and regular rhythm.   Murmur heard. Pulmonary/Chest: Effort normal. No respiratory distress. She has no wheezes. She has rales (right lower lobe). She exhibits no tenderness.  Abdominal: Soft. Bowel sounds are normal. She exhibits no distension and no mass. There is no tenderness.  Musculoskeletal: Normal range of motion. She exhibits tenderness (at sacrum). She exhibits no edema.  Lymphadenopathy:    She has no cervical adenopathy.  Neurological: She is alert and oriented to person, place, and time. She has normal reflexes. No cranial nerve deficit. Coordination normal.  Intact vibratory. 11/30/13 MMSE 26/30. Passed clock drawing. 12/06/14 MMSE 28/30. Passed clock drawing. 06/06/15 MMSE 28/30. Passed clock drawing. 12/11/15 MMSE 26/30. Passed clock drawing.  Skin: Skin is warm and dry. She is not diaphoretic. No erythema. No pallor.  multilpe purpuric areas. Large V shaped laceration of the right forearm that remains sutured.   Psychiatric: She has a normal mood and affect. Her behavior is normal. Judgment and thought content normal.    Labs reviewed: Lab Summary Latest Ref Rng & Units 01/27/2016 12/07/2015 09/25/2015  Hemoglobin 12.0 - 15.0 g/dL 13.0 (None) 13.5  Hematocrit 36.0 - 46.0 % 40.9 (None) 40.3  White count 4.0 - 10.5 K/uL 9.6 (None) 6.3  Platelet count 150 - 400 K/uL 165 (None) 157  Sodium 135 - 145 mmol/L 138 136 140  Potassium 3.5 - 5.1 mmol/L 4.6 4.6 5.1  Calcium 8.9 - 10.3 mg/dL 9.4 9.5 9.7  Phosphorus - (None) (None) (None)  Creatinine 0.44 - 1.00 mg/dL 1.45(H) 1.41(H) 1.61(H)  AST 10 - 35 U/L (None) 13 12  Alk Phos 33 - 130 U/L (None) 74 78  Bilirubin 0.2 - 1.2 mg/dL (None) 0.7 0.5  Glucose 65 - 99 mg/dL 99  84 88  Cholesterol <200 mg/dL (None) 161 (None)  HDL cholesterol >50 mg/dL (None) 68 (None)  Triglycerides <150 mg/dL (None) 70 (None)  LDL Direct - (None) (None) (None)  LDL Calc <100 mg/dL (None) 79 (None)  Total protein 6.1 - 8.1 g/dL (None) 6.8 7.0  Albumin 3.6 - 5.1 g/dL (None) 3.6 3.8  Some recent data might be hidden   Lab Results  Component Value Date   TSH 2.02 09/25/2015   TSH 1.28 04/15/2011   Lab Results  Component Value Date   BUN 32 (H) 01/27/2016   BUN 26 (H) 12/07/2015   BUN 29 (H) 09/25/2015     Lab Results  Component Value Date   HGBA1C 5.9 (H) 06/04/2015   Dg Sacrum/coccyx  Result Date: 01/27/2016 CLINICAL DATA:  Fall, coccyx injury, pain EXAM: SACRUM AND COCCYX - 2+ VIEW COMPARISON:  None available FINDINGS: Bones are osteopenic. Normal appearing SI joints for age. On the lateral view, there is fragmentation and irregularity of the distal sacrum compatible with an acute nondisplaced sacral fracture. Visualized bony pelvis otherwise intact. IMPRESSION: Acute nondisplaced distal sacral fracture on the lateral view. Electronically Signed   By: M.  Shick M.D.   On: 01/27/2016 09:23   Assessment/Plan  1. Open wound of right forearm, subsequent encounter Healing. See Urgent care for follow up and suture removal as scheduled.  2. Closed fracture of sacrum and coccyx, initial encounter (HCC) No significant pain  3. Fall, initial encounter Uncertain etiology for the falls  4. Syncope, unspecified syncope type - US Carotid Duplex Bilateral; Future - Holter monitor - 24 hour; Future - EEG; Future - CT Head Wo Contrast; Future  5. Memory change - CT Head Wo Contrast; Future  6. Essential hypertension Mild elevation in BP last 2 times it was checked. I am hesitant to add more medication at this point. Recheck in 2 weeks.  

## 2016-01-31 ENCOUNTER — Encounter: Payer: Self-pay | Admitting: Urgent Care

## 2016-01-31 ENCOUNTER — Ambulatory Visit: Payer: Medicare Other | Admitting: Urgent Care

## 2016-01-31 ENCOUNTER — Ambulatory Visit (INDEPENDENT_AMBULATORY_CARE_PROVIDER_SITE_OTHER): Payer: Medicare Other | Admitting: Urgent Care

## 2016-01-31 VITALS — BP 176/72 | HR 60 | Temp 97.5°F | Resp 16 | Ht 65.0 in | Wt 105.0 lb

## 2016-01-31 DIAGNOSIS — S51811D Laceration without foreign body of right forearm, subsequent encounter: Secondary | ICD-10-CM

## 2016-01-31 DIAGNOSIS — R030 Elevated blood-pressure reading, without diagnosis of hypertension: Secondary | ICD-10-CM | POA: Diagnosis not present

## 2016-01-31 DIAGNOSIS — I10 Essential (primary) hypertension: Secondary | ICD-10-CM

## 2016-01-31 NOTE — Patient Instructions (Addendum)
Come back for check up on both Saturday, 02/02/2016, and Wednesday, 02/06/2016. I will see you on Wednesday and a different provider will see you Saturday.   Laceration Care, Adult Introduction A laceration is a cut that goes through all layers of the skin. The cut also goes into the tissue that is right under the skin. Some cuts heal on their own. Others need to be closed with stitches (sutures), staples, skin adhesive strips, or wound glue. Taking care of your cut lowers your risk of infection and helps your cut to heal better. How to take care of your cut For stitches or staples:  Keep the wound clean and dry.  If you were given a bandage (dressing), you should change it at least one time per day or as told by your doctor. You should also change it if it gets wet or dirty.  Keep the wound completely dry for the first 24 hours or as told by your doctor. After that time, you may take a shower or a bath. However, make sure that the wound is not soaked in water until after the stitches or staples have been removed.  Clean the wound one time each day or as told by your doctor:  Wash the wound with soap and water.  Rinse the wound with water until all of the soap comes off.  Pat the wound dry with a clean towel. Do not rub the wound.  After you clean the wound, put a thin layer of antibiotic ointment on it as told by your doctor. This ointment:  Helps to prevent infection.  Keeps the bandage from sticking to the wound.  Have your stitches or staples removed as told by your doctor. If your doctor used skin adhesive strips:  Keep the wound clean and dry.  If you were given a bandage, you should change it at least one time per day or as told by your doctor. You should also change it if it gets dirty or wet.  Do not get the skin adhesive strips wet. You can take a shower or a bath, but be careful to keep the wound dry.  If the wound gets wet, pat it dry with a clean towel. Do not rub  the wound.  Skin adhesive strips fall off on their own. You can trim the strips as the wound heals. Do not remove any strips that are still stuck to the wound. They will fall off after a while. If your doctor used wound glue:  Try to keep your wound dry, but you may briefly wet it in the shower or bath. Do not soak the wound in water, such as by swimming.  After you take a shower or a bath, gently pat the wound dry with a clean towel. Do not rub the wound.  Do not do any activities that will make you really sweaty until the skin glue has fallen off on its own.  Do not apply liquid, cream, or ointment medicine to your wound while the skin glue is still on.  If you were given a bandage, you should change it at least one time per day or as told by your doctor. You should also change it if it gets dirty or wet.  If a bandage is placed over the wound, do not let the tape for the bandage touch the skin glue.  Do not pick at the glue. The skin glue usually stays on for 5-10 days. Then, it falls off of the skin.  General Instructions  To help prevent scarring, make sure to cover your wound with sunscreen whenever you are outside after stitches are removed, after adhesive strips are removed, or when wound glue stays in place and the wound is healed. Make sure to wear a sunscreen of at least 30 SPF.  Take over-the-counter and prescription medicines only as told by your doctor.  If you were given antibiotic medicine or ointment, take or apply it as told by your doctor. Do not stop using the antibiotic even if your wound is getting better.  Do not scratch or pick at the wound.  Keep all follow-up visits as told by your doctor. This is important.  Check your wound every day for signs of infection. Watch for:  Redness, swelling, or pain.  Fluid, blood, or pus.  Raise (elevate) the injured area above the level of your heart while you are sitting or lying down, if possible. Get help if:  You  got a tetanus shot and you have any of these problems at the injection site:  Swelling.  Very bad pain.  Redness.  Bleeding.  You have a fever.  A wound that was closed breaks open.  You notice a bad smell coming from your wound or your bandage.  You notice something coming out of the wound, such as wood or glass.  Medicine does not help your pain.  You have more redness, swelling, or pain at the site of your wound.  You have fluid, blood, or pus coming from your wound.  You notice a change in the color of your skin near your wound.  You need to change the bandage often because fluid, blood, or pus is coming from the wound.  You start to have a new rash.  You start to have numbness around the wound. Get help right away if:  You have very bad swelling around the wound.  Your pain suddenly gets worse and is very bad.  You notice painful lumps near the wound or on skin that is anywhere on your body.  You have a red streak going away from your wound.  The wound is on your hand or foot and you cannot move a finger or toe like you usually can.  The wound is on your hand or foot and you notice that your fingers or toes look pale or bluish. This information is not intended to replace advice given to you by your health care provider. Make sure you discuss any questions you have with your health care provider. Document Released: 06/25/2007 Document Revised: 06/14/2015 Document Reviewed: 01/02/2014  2017 Elsevier     IF you received an x-ray today, you will receive an invoice from [facility]. Please contact [facility] at [phone] with questions or concerns regarding your invoice.   IF you received labwork today, you will receive an invoice from [facility]. Please contact [facility] at [phone] with questions or concerns regarding your invoice.   Our billing staff will not be able to assist you with questions regarding bills from these  companies.  You will be contacted with the lab results as soon as they are available. The fastest way to get your results is to activate your My Chart account. Instructions are located on the last page of this paperwork. If you have not heard from [place] regarding the results in 2 weeks, please contact this office.

## 2016-01-31 NOTE — Progress Notes (Signed)
    MRN: [id number] DOB: 1932-01-24  Subjective:   Lindsey Salazar is a 83 y.o. female presenting for follow up on suture removal. She has kept area clean, dry. Denies fever, pain, drainage of pus, worsening swelling, loss of ROM. She was seen by her PCP, Dr. Nyoka Cowden, on 01/30/2016 and advised her to rtc today for wound check.  Lindsey Salazar has a current medication list which includes the following prescription(s): acetaminophen, aspirin, atorvastatin, calcium-vitamin d, cephalexin, donepezil, losartan, metoprolol, multivitamin, and NON FORMULARY. Also is allergic to penicillins.  Lindsey Salazar  has a past medical history of Acute bronchitis; Benign neoplasm of uterus, part unspecified; Chronic ischemic heart disease, unspecified; Chronic kidney disease, stage II (mild); Cough; Encounter for long-term (current) use of other medications; Ganglion, unspecified; Hearing loss; Hyperlipidemia; Hypertension; Impacted cerumen; Light-headed feeling (09/25/2015); Macular degeneration (senile) of retina, unspecified; Mitral valve disorders(424.0); Nonspecific abnormal electrocardiogram (ECG) (EKG); Nonspecific abnormal electrocardiogram (ECG) (EKG); Osteoporosis, unspecified; Other abnormal blood chemistry; Other emphysema ([place]); Other malaise and fatigue; Palpitations; Senile cataract, unspecified; Senile purpura ([place]) (06/06/2015); Thrombocytopenia, unspecified; and Unspecified hearing loss. Also  has a past surgical history that includes Knee arthroscopy.  Objective:   Vitals: BP (!) 176/72   Pulse 60   Temp 97.5 F (36.4 C) (Oral)   Resp 16   Ht 5\' 5"  (1.651 m)   Wt 105 lb (47.6 kg)   SpO2 96%   BMI 17.47 kg/m   BP Readings from Last 3 Encounters:  01/31/16 (!) 176/72  01/30/16 (!) 152/100  01/27/16 148/83   Physical Exam  Constitutional: She is oriented to person, place, and time. She appears well-developed and well-nourished.  Cardiovascular: Normal rate, regular rhythm and intact distal pulses.   Exam reveals no gallop and no friction rub.   No murmur heard. Pulmonary/Chest: Effort normal. No respiratory distress. She has no wheezes. She has no rales.  Neurological: She is alert and oriented to person, place, and time.   WOUND - There is no tenderness or drainage expressed. There is slight erythema and trace edema but no warmth. Forearm has full ROM, strength 5/5. I removed 3 simple interrupted sutures and 2 horizontal mattress sutures from either side of the wound. The central sutures stayed in place. Patient tolerated this well, suture removal without incident.    Assessment and Plan :   1. Laceration of right forearm, subsequent encounter - I discussed signs of infection with patient. She is to rtc on 02/02/2016 for wound recheck. I believe she will need to follow up again on 02/06/2016 for additional suture removal if they are not all removed on Saturday. That should be decided based on clinical exam which has been very reassuring. She is on Keflex currently for cystitis and was previously placed on that by me to cover for infection given depth of her laceration. Patient is in agreement.  2. Essential hypertension 3. Elevated blood pressure reading - She was seen by her PCP, Dr. Nyoka Cowden, yesterday. Patient states that no changes were made to her blood pressure medications. I advised patient to continue to check her blood pressure and discuss readings with her PCP who may or may not want to make changes to her regimen. Patient agreed.  Jaynee Eagles, PA-C [facility] [phone] 01/31/2016 12:15 PM

## 2016-02-02 ENCOUNTER — Ambulatory Visit (INDEPENDENT_AMBULATORY_CARE_PROVIDER_SITE_OTHER): Payer: Medicare Other | Admitting: Physician Assistant

## 2016-02-02 VITALS — BP 124/84 | HR 68 | Temp 97.5°F | Resp 16 | Ht 65.0 in | Wt 106.2 lb

## 2016-02-02 DIAGNOSIS — Z5189 Encounter for other specified aftercare: Secondary | ICD-10-CM

## 2016-02-02 DIAGNOSIS — Z4802 Encounter for removal of sutures: Secondary | ICD-10-CM

## 2016-02-02 NOTE — Patient Instructions (Signed)
     IF you received an x-ray today, you will receive an invoice from [facility]. Please contact [facility] at [phone] with questions or concerns regarding your invoice.   IF you received labwork today, you will receive an invoice from [facility]. Please contact [facility] at [phone] with questions or concerns regarding your invoice.   Our billing staff will not be able to assist you with questions regarding bills from these companies.  You will be contacted with the lab results as soon as they are available. The fastest way to get your results is to activate your My Chart account. Instructions are located on the last page of this paperwork. If you have not heard from us regarding the results in 2 weeks, please contact this office.     

## 2016-02-02 NOTE — Progress Notes (Signed)
    MRN: [id number] DOB: 18-May-1932  Subjective:   Lindsey Salazar is a 83 y.o. female presenting for follow up on suture removal. She presented to [facility] initially on 01/22/2016 for left arm laceration.  She was here three days ago for wound check and suture removal, a few sutures were removed at that time, and she was asked to RTC today for recheck.  She is feeling well today. Keeping wound clean and dry. Not applying any ointment.  She is taking Keflex for cystitis, originally prescribed by Lindsey Salazar for infection prophylaxis.  Denies fever, chills, joint pain, increased swelling or redness.    Sole has a current medication list which includes the following prescription(s): acetaminophen, aspirin, atorvastatin, calcium-vitamin d, cephalexin, donepezil, losartan, metoprolol, multivitamin, and NON FORMULARY. Also is allergic to penicillins.  Lindsey Salazar  has a past medical history of Acute bronchitis; Benign neoplasm of uterus, part unspecified; Chronic ischemic heart disease, unspecified; Chronic kidney disease, stage II (mild); Cough; Encounter for long-term (current) use of other medications; Ganglion, unspecified; Hearing loss; Hyperlipidemia; Hypertension; Impacted cerumen; Light-headed feeling (09/25/2015); Macular degeneration (senile) of retina, unspecified; Mitral valve disorders(424.0); Nonspecific abnormal electrocardiogram (ECG) (EKG); Nonspecific abnormal electrocardiogram (ECG) (EKG); Osteoporosis, unspecified; Other abnormal blood chemistry; Other emphysema ([place]); Other malaise and fatigue; Palpitations; Senile cataract, unspecified; Senile purpura ([place]) (06/06/2015); Thrombocytopenia, unspecified; and Unspecified hearing loss. Also  has a past surgical history that includes Knee arthroscopy.   Objective:   Vitals: BP 124/84   Pulse 68   Temp 97.5 F (36.4 C) (Oral)   Resp 16   Ht 5\' 5"  (1.651 m)   Wt 106 lb 4 oz (48.2 kg)   SpO2 98%   BMI 17.68 kg/m   Physical Exam    Constitutional: She appears well-developed and well-nourished.  Eyes: EOM are normal. Pupils are equal, round, and reactive to light.  Pulmonary/Chest: Effort normal. No respiratory distress.  Skin: Skin is warm and dry.       No results found for this or any previous visit (from the past 24 hour(s)).  Assessment and Plan :  1. Encounter for wound care 2. Encounter for removal of sutures - Laceration is healing well. No sign of infection. Removed four sutures today. Advised pt f/u on 01/27 for recheck and suture removal, as per [facility] note on 1/11. Discussed signs of infection with pt. She is still taking Keflex for cystitis, however was initially prescribed this by Lindsey Salazar for infection prophylaxis.   Lindsey Pod, PA-C  [facility] 02/02/2016 2:41 PM

## 2016-02-04 ENCOUNTER — Other Ambulatory Visit: Payer: Self-pay | Admitting: *Deleted

## 2016-02-04 DIAGNOSIS — R55 Syncope and collapse: Secondary | ICD-10-CM

## 2016-02-05 ENCOUNTER — Ambulatory Visit
Admission: RE | Admit: 2016-02-05 | Discharge: 2016-02-05 | Disposition: A | Discharge status: Home / Self Care | Payer: Medicare Other | Source: Ambulatory Visit | Attending: Internal Medicine | Admitting: Internal Medicine

## 2016-02-05 DIAGNOSIS — R55 Syncope and collapse: Secondary | ICD-10-CM

## 2016-02-05 DIAGNOSIS — R413 Other amnesia: Secondary | ICD-10-CM

## 2016-02-07 ENCOUNTER — Other Ambulatory Visit: Payer: Medicare Other

## 2016-02-08 ENCOUNTER — Ambulatory Visit (INDEPENDENT_AMBULATORY_CARE_PROVIDER_SITE_OTHER): Payer: Medicare Other | Admitting: Urgent Care

## 2016-02-08 ENCOUNTER — Encounter: Payer: Self-pay | Admitting: Urgent Care

## 2016-02-08 VITALS — BP 148/80 | HR 79 | Temp 98.1°F | Resp 16 | Ht 65.0 in | Wt 104.0 lb

## 2016-02-08 DIAGNOSIS — S51811D Laceration without foreign body of right forearm, subsequent encounter: Secondary | ICD-10-CM

## 2016-02-08 DIAGNOSIS — Z5189 Encounter for other specified aftercare: Secondary | ICD-10-CM | POA: Diagnosis not present

## 2016-02-08 DIAGNOSIS — Z4802 Encounter for removal of sutures: Secondary | ICD-10-CM

## 2016-02-08 NOTE — Patient Instructions (Signed)
     IF you received an x-ray today, you will receive an invoice from [facility]. Please contact [facility] at [phone] with questions or concerns regarding your invoice.   IF you received labwork today, you will receive an invoice from [facility]. Please contact [facility] at [phone] with questions or concerns regarding your invoice.   Our billing staff will not be able to assist you with questions regarding bills from these companies.  You will be contacted with the lab results as soon as they are available. The fastest way to get your results is to activate your My Chart account. Instructions are located on the last page of this paperwork. If you have not heard from us regarding the results in 2 weeks, please contact this office.     

## 2016-02-08 NOTE — Progress Notes (Signed)
    MRN: [id number] DOB: 1932/07/08  Subjective:   Lindsey Salazar is a 83 y.o. female presenting for follow up on laceration and suture removal. Last office visit was 01/31/2016, had 4 sutures removed.  Denies swelling, pain, redness, drainage of pus or bleeding.   Paddy has a current medication list which includes the following prescription(s): acetaminophen, aspirin, atorvastatin, calcium-vitamin d, donepezil, losartan, metoprolol, multivitamin, and NON FORMULARY. Also is allergic to penicillins.  Lesli  has a past medical history of Acute bronchitis; Benign neoplasm of uterus, part unspecified; Chronic ischemic heart disease, unspecified; Chronic kidney disease, stage II (mild); Cough; Encounter for long-term (current) use of other medications; Ganglion, unspecified; Hearing loss; Hyperlipidemia; Hypertension; Impacted cerumen; Light-headed feeling (09/25/2015); Macular degeneration (senile) of retina, unspecified; Mitral valve disorders(424.0); Nonspecific abnormal electrocardiogram (ECG) (EKG); Nonspecific abnormal electrocardiogram (ECG) (EKG); Osteoporosis, unspecified; Other abnormal blood chemistry; Other emphysema ([place]); Other malaise and fatigue; Palpitations; Senile cataract, unspecified; Senile purpura ([place]) (06/06/2015); Thrombocytopenia, unspecified; and Unspecified hearing loss. Also  has a past surgical history that includes Knee arthroscopy.  Objective:   Vitals: BP (!) 148/80 (BP Location: Right Arm, Patient Position: Sitting, Cuff Size: Small)   Pulse 79   Temp 98.1 F (36.7 C) (Oral)   Resp 16   Ht 5\' 5"  (1.651 m)   Wt 104 lb (47.2 kg)   SpO2 95%   BMI 17.31 kg/m   Physical Exam  Constitutional: She is oriented to person, place, and time. She appears well-developed and well-nourished.  Cardiovascular: Normal rate.   Pulmonary/Chest: Effort normal.  Neurological: She is alert and oriented to person, place, and time.  Skin:      WOUND CARE - 1 suture  removed today at apex of wound. Patient tolerated this well without incident.   Assessment and Plan :   1. Encounter for wound care 2. Encounter for removal of sutures 3. Laceration of right forearm, subsequent encounter - Laceration is healing very well. Patient is to continue with wound care as discussed in clinic. RTC on 02/14/2016.  Jaynee Eagles, PA-C [facility] [phone] 02/08/2016 2:00 PM

## 2016-02-11 ENCOUNTER — Inpatient Hospital Stay (HOSPITAL_COMMUNITY): Admission: RE | Admit: 2016-02-11 | Payer: Medicare Other | Source: Ambulatory Visit

## 2016-02-11 ENCOUNTER — Ambulatory Visit (HOSPITAL_COMMUNITY)
Admission: RE | Admit: 2016-02-11 | Discharge: 2016-02-11 | Disposition: A | Discharge status: Home / Self Care | Payer: Medicare Other | Source: Ambulatory Visit | Attending: Internal Medicine | Admitting: Internal Medicine

## 2016-02-11 ENCOUNTER — Encounter (HOSPITAL_COMMUNITY): Payer: Self-pay

## 2016-02-11 DIAGNOSIS — R55 Syncope and collapse: Secondary | ICD-10-CM | POA: Diagnosis not present

## 2016-02-11 NOTE — Progress Notes (Signed)
Preliminary results by tech - Carotid Duplex Completed. No evidence of a significant stenosis noted in bilateral carotid arteries. Vertebral artery demonstrated antegrade flow.  Oda Cogan, BS, RDMS, RVT

## 2016-02-12 ENCOUNTER — Ambulatory Visit (INDEPENDENT_AMBULATORY_CARE_PROVIDER_SITE_OTHER): Payer: Medicare Other | Admitting: Internal Medicine

## 2016-02-12 ENCOUNTER — Encounter: Payer: Self-pay | Admitting: Internal Medicine

## 2016-02-12 ENCOUNTER — Other Ambulatory Visit: Payer: Self-pay | Admitting: Internal Medicine

## 2016-02-12 VITALS — BP 124/76 | HR 77 | Temp 97.8°F | Ht 65.0 in | Wt 104.0 lb

## 2016-02-12 DIAGNOSIS — I10 Essential (primary) hypertension: Secondary | ICD-10-CM

## 2016-02-12 DIAGNOSIS — S3210XA Unspecified fracture of sacrum, initial encounter for closed fracture: Secondary | ICD-10-CM

## 2016-02-12 DIAGNOSIS — S322XXA Fracture of coccyx, initial encounter for closed fracture: Secondary | ICD-10-CM

## 2016-02-12 DIAGNOSIS — R55 Syncope and collapse: Secondary | ICD-10-CM

## 2016-02-12 DIAGNOSIS — S51801D Unspecified open wound of right forearm, subsequent encounter: Secondary | ICD-10-CM

## 2016-02-12 DIAGNOSIS — R413 Other amnesia: Secondary | ICD-10-CM

## 2016-02-12 NOTE — Progress Notes (Signed)
Facility  [facility]    Place of Service:   OFFICE    Allergies  Allergen Reactions  . Penicillins     Chief Complaint  Patient presents with  . Annual Exam    Wellness exam. Here with husband and daughter Patrici Ranks  . Medical Management of Chronic Issues    medication management blood pressure, cholesterol, CKD, memory    HPI:  Syncope:  CT head unremarkable. Still pending is US carotid, EEG, Holter monitor.  Fx of sacrum - uncomfortable with sitting, but pain is controlled.  Wound of forearm - healing. All but 2 sutures have been removed.   HTN - controlled  Memory change - has been on 10 mg donepezil.  Medications: Patient's Medications  New Prescriptions   No medications on file  Previous Medications   ACETAMINOPHEN (TYLENOL) 500 MG TABLET    Take 500 mg by mouth every 6 (six) hours as needed.   ASPIRIN 81 MG TABLET    Take 81 mg by mouth. Take one tablet every other day   ATORVASTATIN (LIPITOR) 10 MG TABLET    TAKE 1 TABLET ONCE DAILY TO LOWER CHOLESTEROL.   CALCIUM-VITAMIN D PO    Take by mouth 2 (two) times daily.     DONEPEZIL (ARICEPT) 10 MG TABLET    One daily to help preserve memory   LOSARTAN (COZAAR) 50 MG TABLET    TAKE 1 TABLET DAILY TO CONTROL BLOOD PRESSURE.   METOPROLOL (LOPRESSOR) 50 MG TABLET    Take 50 mg by mouth. Take one tablet every 8 hours as needed for palpitations   MULTIPLE VITAMIN (MULTIVITAMIN) CAPSULE    Take 1 capsule by mouth daily.     NON FORMULARY    2 (two) times daily. occu support tabs bid  Modified Medications   No medications on file  Discontinued Medications   No medications on file    Review of Systems  Constitutional: Positive for fatigue. Negative for activity change, appetite change, chills and fever.  HENT: Negative for congestion and rhinorrhea.   Eyes:       Some deterioration ii vision recently according to report from her ophth.  Respiratory: Positive for cough (mild dry cough). Negative for chest tightness and  shortness of breath.   Cardiovascular: Positive for palpitations.  Gastrointestinal: Negative.  Negative for abdominal distention, abdominal pain, constipation, nausea and vomiting.  Endocrine: Negative.   Musculoskeletal: Positive for gait problem.       Fx sacrum 01/27/16.  Skin: Positive for wound (right forearm laceration).       Complains of easy bruising.  Neurological:       Complaint of some balance issues and falls.  Reports mild cognitive issues. Recurrent episodes of lightheadedness that have been associated with falls.  Psychiatric/Behavioral: Positive for sleep disturbance.       Wakes up after about an hour of sleep and is awake for another 3 hours.    Vitals:   02/12/16 1449  BP: 124/76  Pulse: 77  Temp: 97.8 F (36.6 C)  TempSrc: Oral  SpO2: 96%  Weight: 104 lb (47.2 kg)  Height: 5' 5"  (1.651 m)   Body mass index is 17.31 kg/m. Wt Readings from Last 3 Encounters:  02/12/16 104 lb (47.2 kg)  02/08/16 104 lb (47.2 kg)  02/02/16 106 lb 4 oz (48.2 kg)      Physical Exam  Constitutional: She is oriented to person, place, and time. She appears well-developed and well-nourished. No distress.  HENT:  Head: Normocephalic and atraumatic.  Right Ear: External ear normal.  Left Ear: External ear normal.  Nose: Nose normal.  Mouth/Throat: Oropharynx is clear and moist.  Eyes: Conjunctivae and EOM are normal. Pupils are equal, round, and reactive to light.  Corrective lenses.  Neck: Normal range of motion. Neck supple. No JVD present. No tracheal deviation present. No thyromegaly present.  Cardiovascular: Normal rate and regular rhythm.   Murmur heard. Pulmonary/Chest: Effort normal. No respiratory distress. She has no wheezes. She has rales (right lower lobe). She exhibits no tenderness.  Abdominal: Soft. Bowel sounds are normal. She exhibits no distension and no mass. There is no tenderness.  Musculoskeletal: Normal range of motion. She exhibits tenderness (at  sacrum). She exhibits no edema.  Lymphadenopathy:    She has no cervical adenopathy.  Neurological: She is alert and oriented to person, place, and time. She has normal reflexes. No cranial nerve deficit. Coordination normal.  Intact vibratory. 11/30/13 MMSE 26/30. Passed clock drawing. 12/06/14 MMSE 28/30. Passed clock drawing. 06/06/15 MMSE 28/30. Passed clock drawing. 12/11/15 MMSE 26/30. Passed clock drawing.  Skin: Skin is warm and dry. She is not diaphoretic. No erythema. No pallor.  multilpe purpuric areas. Large V shaped laceration of the right forearm that remains sutured.   Psychiatric: She has a normal mood and affect. Her behavior is normal. Judgment and thought content normal.    Labs reviewed: Lab Summary Latest Ref Rng & Units 01/27/2016 12/07/2015 09/25/2015  Hemoglobin 12.0 - 15.0 g/dL 13.0 (None) 13.5  Hematocrit 36.0 - 46.0 % 40.9 (None) 40.3  White count 4.0 - 10.5 K/uL 9.6 (None) 6.3  Platelet count 150 - 400 K/uL 165 (None) 157  Sodium 135 - 145 mmol/L 138 136 140  Potassium 3.5 - 5.1 mmol/L 4.6 4.6 5.1  Calcium 8.9 - 10.3 mg/dL 9.4 9.5 9.7  Phosphorus - (None) (None) (None)  Creatinine 0.44 - 1.00 mg/dL 1.45(H) 1.41(H) 1.61(H)  AST 10 - 35 U/L (None) 13 12  Alk Phos 33 - 130 U/L (None) 74 78  Bilirubin 0.2 - 1.2 mg/dL (None) 0.7 0.5  Glucose 65 - 99 mg/dL 99 84 88  Cholesterol <200 mg/dL (None) 161 (None)  HDL cholesterol >50 mg/dL (None) 68 (None)  Triglycerides <150 mg/dL (None) 70 (None)  LDL Direct - (None) (None) (None)  LDL Calc <100 mg/dL (None) 79 (None)  Total protein 6.1 - 8.1 g/dL (None) 6.8 7.0  Albumin 3.6 - 5.1 g/dL (None) 3.6 3.8  Some recent data might be hidden   Lab Results  Component Value Date   TSH 2.02 09/25/2015   TSH 1.28 04/15/2011   Lab Results  Component Value Date   BUN 32 (H) 01/27/2016   BUN 26 (H) 12/07/2015   BUN 29 (H) 09/25/2015   Lab Results  Component Value Date   HGBA1C 5.9 (H) 06/04/2015   02/05/16 CT head:    IMPRESSION: 1. Diffuse atrophy.  No acute intracranial abnormality. 2. The sinuses appear clear but the maxillary sinuses are not included on the current study.  Assessment/Plan  1. Syncope, unspecified syncope type Awaiting reports of several tests  2. Open wound of right forearm, subsequent encounter healing  3. Memory change Continue donepezil  4. Essential hypertension The current medical regimen is effective;  continue present plan and medications.  5. Closed fracture of sacrum and coccyx, initial encounter (HCC) Pain controlled

## 2016-02-13 ENCOUNTER — Ambulatory Visit (INDEPENDENT_AMBULATORY_CARE_PROVIDER_SITE_OTHER): Payer: Medicare Other | Admitting: Neurology

## 2016-02-13 DIAGNOSIS — R55 Syncope and collapse: Secondary | ICD-10-CM

## 2016-02-14 ENCOUNTER — Encounter: Payer: Self-pay | Admitting: Urgent Care

## 2016-02-14 ENCOUNTER — Ambulatory Visit (INDEPENDENT_AMBULATORY_CARE_PROVIDER_SITE_OTHER): Payer: Medicare Other

## 2016-02-14 ENCOUNTER — Ambulatory Visit (INDEPENDENT_AMBULATORY_CARE_PROVIDER_SITE_OTHER): Payer: Medicare Other | Admitting: Urgent Care

## 2016-02-14 VITALS — BP 120/72 | HR 65 | Temp 98.1°F | Resp 16 | Ht 65.5 in | Wt 102.6 lb

## 2016-02-14 DIAGNOSIS — Z4802 Encounter for removal of sutures: Secondary | ICD-10-CM

## 2016-02-14 DIAGNOSIS — R55 Syncope and collapse: Secondary | ICD-10-CM | POA: Diagnosis not present

## 2016-02-14 DIAGNOSIS — S51811D Laceration without foreign body of right forearm, subsequent encounter: Secondary | ICD-10-CM

## 2016-02-14 DIAGNOSIS — Z5189 Encounter for other specified aftercare: Secondary | ICD-10-CM

## 2016-02-14 NOTE — Progress Notes (Signed)
   Patient: Lindsey Salazar [id number]  Subjective: Lindsey Salazar is returning for suture removal. Patient was last seen 01/31/2016. Her wound has healed slowly given depth and associated contusion. Denies fever, drainage of pus or blood, wound dehiscence, edema, pain. Swelling of her elbow and associated redness has also improved. She has been using her arm as she normally would.  Objective: BP 120/72   Pulse 65   Temp 98.1 F (36.7 C) (Oral)   Resp 16   Ht 5' 5.5" (1.664 m)   Wt 102 lb 9.6 oz (46.5 kg)   SpO2 95%   BMI 16.81 kg/m   Physical Exam  Constitutional: She is oriented to person, place, and time and well-developed, well-nourished, and in no distress.  Cardiovascular: Normal rate.   Pulmonary/Chest: Effort normal.  Musculoskeletal:       Right forearm: She exhibits laceration. She exhibits no tenderness, no bony tenderness, no swelling, no edema and no deformity.       Arms: Neurological: She is alert and oriented to person, place, and time.  Skin: Skin is warm and dry.     #2 sutures removed without incident. Patient tolerated this well.  Assessment and Plan: Well-healed wound. Anticipatory guidance provided. Return to clinic as needed.  Jaynee Eagles, PA-C [facility] [phone] 02/14/2016  8:34 AM

## 2016-02-14 NOTE — Patient Instructions (Signed)
Laceration Care, Adult Introduction A laceration is a cut that goes through all layers of the skin. The cut also goes into the tissue that is right under the skin. Some cuts heal on their own. Others need to be closed with stitches (sutures), staples, skin adhesive strips, or wound glue. Taking care of your cut lowers your risk of infection and helps your cut to heal better. How to take care of your cut For stitches or staples:  Keep the wound clean and dry.  If you were given a bandage (dressing), you should change it at least one time per day or as told by your doctor. You should also change it if it gets wet or dirty.  Keep the wound completely dry for the first 24 hours or as told by your doctor. After that time, you may take a shower or a bath. However, make sure that the wound is not soaked in water until after the stitches or staples have been removed.  Clean the wound one time each day or as told by your doctor:  Wash the wound with soap and water.  Rinse the wound with water until all of the soap comes off.  Pat the wound dry with a clean towel. Do not rub the wound.  After you clean the wound, put a thin layer of antibiotic ointment on it as told by your doctor. This ointment:  Helps to prevent infection.  Keeps the bandage from sticking to the wound.  Have your stitches or staples removed as told by your doctor. If your doctor used skin adhesive strips:  Keep the wound clean and dry.  If you were given a bandage, you should change it at least one time per day or as told by your doctor. You should also change it if it gets dirty or wet.  Do not get the skin adhesive strips wet. You can take a shower or a bath, but be careful to keep the wound dry.  If the wound gets wet, pat it dry with a clean towel. Do not rub the wound.  Skin adhesive strips fall off on their own. You can trim the strips as the wound heals. Do not remove any strips that are still stuck to the wound.  They will fall off after a while. If your doctor used wound glue:  Try to keep your wound dry, but you may briefly wet it in the shower or bath. Do not soak the wound in water, such as by swimming.  After you take a shower or a bath, gently pat the wound dry with a clean towel. Do not rub the wound.  Do not do any activities that will make you really sweaty until the skin glue has fallen off on its own.  Do not apply liquid, cream, or ointment medicine to your wound while the skin glue is still on.  If you were given a bandage, you should change it at least one time per day or as told by your doctor. You should also change it if it gets dirty or wet.  If a bandage is placed over the wound, do not let the tape for the bandage touch the skin glue.  Do not pick at the glue. The skin glue usually stays on for 5-10 days. Then, it falls off of the skin. General Instructions  To help prevent scarring, make sure to cover your wound with sunscreen whenever you are outside after stitches are removed, after adhesive strips are removed,  or when wound glue stays in place and the wound is healed. Make sure to wear a sunscreen of at least 30 SPF.  Take over-the-counter and prescription medicines only as told by your doctor.  If you were given antibiotic medicine or ointment, take or apply it as told by your doctor. Do not stop using the antibiotic even if your wound is getting better.  Do not scratch or pick at the wound.  Keep all follow-up visits as told by your doctor. This is important.  Check your wound every day for signs of infection. Watch for:  Redness, swelling, or pain.  Fluid, blood, or pus.  Raise (elevate) the injured area above the level of your heart while you are sitting or lying down, if possible. Get help if:  You got a tetanus shot and you have any of these problems at the injection site:  Swelling.  Very bad pain.  Redness.  Bleeding.  You have a fever.  A wound  that was closed breaks open.  You notice a bad smell coming from your wound or your bandage.  You notice something coming out of the wound, such as wood or glass.  Medicine does not help your pain.  You have more redness, swelling, or pain at the site of your wound.  You have fluid, blood, or pus coming from your wound.  You notice a change in the color of your skin near your wound.  You need to change the bandage often because fluid, blood, or pus is coming from the wound.  You start to have a new rash.  You start to have numbness around the wound. Get help right away if:  You have very bad swelling around the wound.  Your pain suddenly gets worse and is very bad.  You notice painful lumps near the wound or on skin that is anywhere on your body.  You have a red streak going away from your wound.  The wound is on your hand or foot and you cannot move a finger or toe like you usually can.  The wound is on your hand or foot and you notice that your fingers or toes look pale or bluish. This information is not intended to replace advice given to you by your health care provider. Make sure you discuss any questions you have with your health care provider. Document Released: 06/25/2007 Document Revised: 06/14/2015 Document Reviewed: 01/02/2014  2017 [employer]vier

## 2016-02-15 ENCOUNTER — Telehealth: Payer: Self-pay

## 2016-02-15 NOTE — Telephone Encounter (Signed)
Left message to call back for carotid results: No significant extracranial carotid artery stenosis demostrated. Vertebrals are patent with antegrade flow. . Report on my desk.

## 2016-02-15 NOTE — Procedures (Signed)
ELECTROENCEPHALOGRAM REPORT  Date of Study: 02/13/2016  Patient's Name: Lindsey Salazar MRN: [id number] Date of Birth: [birth year]  Referring Provider: Dr. Jeanmarie Hubert  Clinical History: This is an 83 year old woman with an increase in falls, unconscious after the recent fall with some confusion after.  Medications: acetaminophen, aspirin, Lipitor, calcium with vitamin D, Aricept, Cozaar, Lopressor, multivitamin  Technical Summary: A multichannel digital EEG recording measured by the international 10-20 system with electrodes applied with paste and impedances below 5000 ohms performed in our laboratory with EKG monitoring in an awake and asleep patient.  Hyperventilation and photic stimulation were performed.  The digital EEG was referentially recorded, reformatted, and digitally filtered in a variety of bipolar and referential montages for optimal display.    Description: The patient is awake and asleep during the recording.  During maximal wakefulness, there is a symmetric, medium voltage 9.5 Hz posterior dominant rhythm that attenuates with eye opening.  The record is symmetric.  During drowsiness and sleep, there is an increase in theta slowing of the background.  Vertex waves and symmetric sleep spindles were seen.  Hyperventilation and photic stimulation did not elicit any abnormalities.  There were no epileptiform discharges or electrographic seizures seen.    EKG lead was unremarkable.  Impression: This awake and asleep EEG is normal.    Clinical Correlation: A normal EEG does not exclude a clinical diagnosis of epilepsy.  Clinical correlation is advised.   Ellouise Newer, M.D.

## 2016-02-15 NOTE — Telephone Encounter (Signed)
Patient called back, gave her the results.

## 2016-02-18 NOTE — Progress Notes (Signed)
Advise patient her EEG was normal

## 2016-02-21 ENCOUNTER — Encounter: Payer: Self-pay | Admitting: Internal Medicine

## 2016-02-25 ENCOUNTER — Other Ambulatory Visit: Payer: Self-pay | Admitting: Internal Medicine

## 2016-04-08 ENCOUNTER — Ambulatory Visit (INDEPENDENT_AMBULATORY_CARE_PROVIDER_SITE_OTHER): Payer: Medicare Other | Admitting: Internal Medicine

## 2016-04-08 ENCOUNTER — Encounter: Payer: Self-pay | Admitting: Internal Medicine

## 2016-04-08 VITALS — BP 192/96 | HR 63 | Temp 97.5°F | Ht 66.0 in | Wt 104.0 lb

## 2016-04-08 DIAGNOSIS — R55 Syncope and collapse: Secondary | ICD-10-CM | POA: Diagnosis not present

## 2016-04-08 DIAGNOSIS — R413 Other amnesia: Secondary | ICD-10-CM

## 2016-04-08 DIAGNOSIS — I10 Essential (primary) hypertension: Secondary | ICD-10-CM | POA: Diagnosis not present

## 2016-04-08 DIAGNOSIS — S51801D Unspecified open wound of right forearm, subsequent encounter: Secondary | ICD-10-CM

## 2016-04-08 MED ORDER — MEMANTINE HCL-DONEPEZIL HCL 7 & 14 & 21 &28 -10 MG PO C4PK: 1.0000 | EXTENDED_RELEASE_CAPSULE | Freq: Every evening | ORAL | 0 refills | Status: DC

## 2016-04-08 MED ORDER — DOXAZOSIN MESYLATE 4 MG PO TABS: ORAL_TABLET | ORAL | 5 refills | Status: DC

## 2016-04-08 NOTE — Patient Instructions (Signed)
Stop donepezil

## 2016-04-08 NOTE — Progress Notes (Signed)
Facility  [facility]    Place of Service:   OFFICE    Allergies  Allergen Reactions  . Penicillins     Chief Complaint  Patient presents with  . Medical Management of Chronic Issues    8 week management of syncope-better.  Here with daughter Lindsey Salazar    HPI:  Syncope, unspecified syncope type  Open wound of right forearm, subsequent encounter  Memory change - se is still concerned. Currently on Aricept.  Essential hypertension - very high today. No accompanying symptoms. No new OTC meds.    Medications: Patient's Medications  New Prescriptions   No medications on file  Previous Medications   ACETAMINOPHEN (TYLENOL) 500 MG TABLET    Take 500 mg by mouth every 6 (six) hours as needed.   ASPIRIN 81 MG TABLET    Take 81 mg by mouth. Take one tablet every other day   ATORVASTATIN (LIPITOR) 10 MG TABLET    TAKE 1 TABLET ONCE DAILY TO LOWER CHOLESTEROL.   CALCIUM-VITAMIN D PO    Take by mouth 2 (two) times daily.     DONEPEZIL (ARICEPT) 10 MG TABLET    One daily to help preserve memory   LOSARTAN (COZAAR) 50 MG TABLET    TAKE 1 TABLET DAILY TO CONTROL BLOOD PRESSURE.   METOPROLOL (LOPRESSOR) 50 MG TABLET    Take 50 mg by mouth. Take one tablet every 8 hours as needed for palpitations   MULTIPLE VITAMIN (MULTIVITAMIN) CAPSULE    Take 1 capsule by mouth daily.     NON FORMULARY    2 (two) times daily. occu support tabs bid  Modified Medications   No medications on file  Discontinued Medications   No medications on file    Review of Systems  Constitutional: Positive for fatigue. Negative for activity change, appetite change, chills and fever.  HENT: Negative for congestion and rhinorrhea.   Eyes:       Some deterioration ii vision recently according to report from her ophth.  Respiratory: Positive for cough (mild dry cough). Negative for chest tightness and shortness of breath.   Cardiovascular: Positive for palpitations.  Gastrointestinal: Negative.  Negative for abdominal  distention, abdominal pain, constipation, nausea and vomiting.  Endocrine: Negative.   Musculoskeletal: Positive for gait problem.       Fx sacrum 01/27/16.  Skin: Negative for wound.       Complains of easy bruising.  Neurological:       Complaint of some balance issues and falls.  Reports mild cognitive issues. Recurrent episodes of lightheadedness that have been associated with falls.  Psychiatric/Behavioral: Positive for sleep disturbance.       Wakes up after about an hour of sleep and is awake for another 3 hours.    Vitals:   04/08/16 1358  BP: (!) 192/96  Pulse: 63  Temp: 97.5 F (36.4 C)  TempSrc: Oral  SpO2: 93%  Weight: 104 lb (47.2 kg)  Height: 5' 6"  (1.676 m)   Body mass index is 16.79 kg/m. Wt Readings from Last 3 Encounters:  04/08/16 104 lb (47.2 kg)  02/14/16 102 lb 9.6 oz (46.5 kg)  02/12/16 104 lb (47.2 kg)      Physical Exam  Constitutional: She is oriented to person, place, and time. She appears well-developed and well-nourished. No distress.  HENT:  Head: Normocephalic and atraumatic.  Right Ear: External ear normal.  Left Ear: External ear normal.  Nose: Nose normal.  Mouth/Throat: Oropharynx is clear and  moist.  Eyes: Conjunctivae and EOM are normal. Pupils are equal, round, and reactive to light.  Corrective lenses.  Neck: Normal range of motion. Neck supple. No JVD present. No tracheal deviation present. No thyromegaly present.  Cardiovascular: Normal rate and regular rhythm.   Murmur heard. Pulmonary/Chest: Effort normal. No respiratory distress. She has no wheezes. She has rales (right lower lobe). She exhibits no tenderness.  Abdominal: Soft. Bowel sounds are normal. She exhibits no distension and no mass. There is no tenderness.  Musculoskeletal: Normal range of motion. She exhibits tenderness (at sacrum). She exhibits no edema.  Lymphadenopathy:    She has no cervical adenopathy.  Neurological: She is alert and oriented to person,  place, and time. She has normal reflexes. No cranial nerve deficit. Coordination normal.  Intact vibratory. 11/30/13 MMSE 26/30. Passed clock drawing. 12/06/14 MMSE 28/30. Passed clock drawing. 06/06/15 MMSE 28/30. Passed clock drawing. 12/11/15 MMSE 26/30. Passed clock drawing.  Skin: Skin is warm and dry. She is not diaphoretic. No erythema. No pallor.  multilpe purpuric areas. Large V shaped laceration of the right forearm is fully healed.  Psychiatric: She has a normal mood and affect. Her behavior is normal. Judgment and thought content normal.    Labs reviewed: Lab Summary Latest Ref Rng & Units 01/27/2016 12/07/2015 09/25/2015  Hemoglobin 12.0 - 15.0 g/dL 13.0 (None) 13.5  Hematocrit 36.0 - 46.0 % 40.9 (None) 40.3  White count 4.0 - 10.5 K/uL 9.6 (None) 6.3  Platelet count 150 - 400 K/uL 165 (None) 157  Sodium 135 - 145 mmol/L 138 136 140  Potassium 3.5 - 5.1 mmol/L 4.6 4.6 5.1  Calcium 8.9 - 10.3 mg/dL 9.4 9.5 9.7  Phosphorus - (None) (None) (None)  Creatinine 0.44 - 1.00 mg/dL 1.45(H) 1.41(H) 1.61(H)  AST 10 - 35 U/L (None) 13 12  Alk Phos 33 - 130 U/L (None) 74 78  Bilirubin 0.2 - 1.2 mg/dL (None) 0.7 0.5  Glucose 65 - 99 mg/dL 99 84 88  Cholesterol <200 mg/dL (None) 161 (None)  HDL cholesterol >50 mg/dL (None) 68 (None)  Triglycerides <150 mg/dL (None) 70 (None)  LDL Direct - (None) (None) (None)  LDL Calc <100 mg/dL (None) 79 (None)  Total protein 6.1 - 8.1 g/dL (None) 6.8 7.0  Albumin 3.6 - 5.1 g/dL (None) 3.6 3.8  Some recent data might be hidden   Lab Results  Component Value Date   TSH 2.02 09/25/2015   TSH 1.28 04/15/2011   Lab Results  Component Value Date   BUN 32 (H) 01/27/2016   BUN 26 (H) 12/07/2015   BUN 29 (H) 09/25/2015   Lab Results  Component Value Date   HGBA1C 5.9 (H) 06/04/2015    02/14/16 Holter: Normal sinus rhythm with occasional PACs and PVCs, bigeminy.  Short runs of SVT intermittently, lasting a few seconds, up to 154 beats per  minute.    No atrial fibrillation noted  02/12/16 Carotid Doppler: No stenoses and patent vertebral arteries 02/13/16 EEG:  normal  Assessment/Plan  1. Syncope, unspecified syncope type Testing has not yielded a result that explains her syncope. I am suspicious she may have had an arrhythmia, but it may take a loop recorder to find the rhythm.  2. Open wound of right forearm, subsequent encounter Fully healed  3. Memory change -Stop donepezil - Memantine HCl-Donepezil HCl (NAMZARIC) 7 & 14 & 21 &28 -10 MG C4PK; Take 1 capsule by mouth Nightly.  Dispense: 28 each; Refill: 0  4. Essential hypertension Add -  doxazosin (CARDURA) 4 MG tablet; One daily to help control BP  Dispense: 30 tablet; Refill: 5

## 2016-04-24 ENCOUNTER — Ambulatory Visit (INDEPENDENT_AMBULATORY_CARE_PROVIDER_SITE_OTHER): Payer: Medicare Other | Admitting: Nurse Practitioner

## 2016-04-24 ENCOUNTER — Encounter: Payer: Self-pay | Admitting: Nurse Practitioner

## 2016-04-24 VITALS — BP 148/70 | HR 68 | Temp 97.6°F | Resp 16 | Ht 66.0 in | Wt 109.4 lb

## 2016-04-24 DIAGNOSIS — M25562 Pain in left knee: Secondary | ICD-10-CM | POA: Diagnosis not present

## 2016-04-24 DIAGNOSIS — S8012XA Contusion of left lower leg, initial encounter: Secondary | ICD-10-CM | POA: Diagnosis not present

## 2016-04-24 DIAGNOSIS — W19XXXA Unspecified fall, initial encounter: Secondary | ICD-10-CM | POA: Diagnosis not present

## 2016-04-24 NOTE — Patient Instructions (Signed)
Ice knee and ankle ~20-30 mins twice daily Notify if pain worsens or affects mobility  If knee conts to bother you let us know

## 2016-04-24 NOTE — Progress Notes (Signed)
Careteam: Patient Care Team: Estill Dooms, MD as PCP - General (Internal Medicine) Minus Breeding, MD as Consulting Physician (Cardiology) Stark Klein, MD as Consulting Physician (General Surgery) Netta Cedars, MD as Consulting Physician (Orthopedic Surgery) Richmond Campbell, MD as Consulting Physician (Gastroenterology) Nat Christen, MD as Attending Physician (Optometry) Hortencia Pilar, MD as Consulting Physician (Surgery)  Advanced Directive information Does Patient Have a Medical Advance Directive?: Yes, Type of Advance Directive: [place];Living will  Allergies  Allergen Reactions  . Penicillins     Chief Complaint  Patient presents with  . Acute Visit    Had a falll last week (04/16/2016)- has a bruise on left hip. Left foot is swelling and has some discoloration on toes.   . Medication Refill    No refills needed at  this time      HPI: Patient is a 83 y.o. female seen in the office today after a fall, 1 week ago yesterday.  Was asleep on the couch and thought she heard someone at the door so she got up really quickly slipped (on carpet and when to hardwood) on the floor and feel onto a chair and then hit the floor.  Right hand was bleeding and hit her left leg/hip. Had to get the bleeding to stop so was focused on her hand.  Hip/leg not painful at all but worried about the bruise and foot swelling/bruising.  Moving about normally. No pain.  Knee is uncomfortable but still able to walk, no pain. Does not feel unstable.   Was started on namzaric at last visit with Dr Nyoka Cowden, states she feels like her eyes feel "blurry" but she is able to see well. Able to read signs and the paper without difficulty. Taking namzaric at night.   Review of Systems:  Review of Systems  Constitutional: Positive for fatigue. Negative for activity change, appetite change, chills and fever.  HENT: Negative for congestion and rhinorrhea.   Eyes:       Some  deterioration ii vision recently according to report from her ophth.  Respiratory: Negative for chest tightness and shortness of breath.   Cardiovascular: Positive for palpitations.  Gastrointestinal: Negative.  Negative for abdominal distention, abdominal pain, constipation, nausea and vomiting.  Endocrine: Negative.   Musculoskeletal: Positive for gait problem and joint swelling.       Fx sacrum 01/27/16.  Skin: Negative for wound.       Complains of easy bruising.  Neurological:       Complaint of some balance issues and falls however reports none of these feelings with recent falls.  Reports mild cognitive issues. Recurrent episodes of lightheadedness that have been associated with falls none with this fall.     Past Medical History:  Diagnosis Date  . Acute bronchitis   . Benign neoplasm of uterus, part unspecified   . Chronic ischemic heart disease, unspecified   . Chronic kidney disease, stage II (mild)   . Cough   . Encounter for long-term (current) use of other medications   . Ganglion, unspecified   . Hearing loss   . Hyperlipidemia   . Hypertension   . Impacted cerumen   . Light-headed feeling 09/25/2015  . Macular degeneration (senile) of retina, unspecified   . Mitral valve disorders(424.0)   . Nonspecific abnormal electrocardiogram (ECG) (EKG)   . Nonspecific abnormal electrocardiogram (ECG) (EKG)   . Osteoporosis, unspecified   . Other abnormal blood chemistry   . Other emphysema ([place])   .  Other malaise and fatigue   . Palpitations   . Senile cataract, unspecified   . Senile purpura ([place]) 06/06/2015  . Thrombocytopenia, unspecified   . Unspecified hearing loss    Past Surgical History:  Procedure Laterality Date  . KNEE ARTHROSCOPY     right   Social History:   reports that she has never smoked. She has never used smokeless tobacco. She reports that she does not drink alcohol or use drugs.  Family History  Problem Relation Age of Onset  . Stroke Brother    . Cancer Sister     lung  . Parkinsonism Father     Alzheimer's    Medications: Patient's Medications  New Prescriptions   No medications on file  Previous Medications   ACETAMINOPHEN (TYLENOL) 500 MG TABLET    Take 500 mg by mouth every 6 (six) hours as needed.   ASPIRIN 81 MG TABLET    Take 81 mg by mouth. Take one tablet every other day   ATORVASTATIN (LIPITOR) 10 MG TABLET    TAKE 1 TABLET ONCE DAILY TO LOWER CHOLESTEROL.   CALCIUM-VITAMIN D PO    Take by mouth 2 (two) times daily.     DONEPEZIL (ARICEPT) 10 MG TABLET    One daily to help preserve memory   DOXAZOSIN (CARDURA) 4 MG TABLET    One daily to help control BP   LOSARTAN (COZAAR) 50 MG TABLET    TAKE 1 TABLET DAILY TO CONTROL BLOOD PRESSURE.   MEMANTINE HCL-DONEPEZIL HCL (NAMZARIC) 7 & 14 & 21 &28 -10 MG C4PK    Take 1 capsule by mouth Nightly.   METOPROLOL (LOPRESSOR) 50 MG TABLET    Take 50 mg by mouth. Take one tablet every 8 hours as needed for palpitations   MULTIPLE VITAMIN (MULTIVITAMIN) CAPSULE    Take 1 capsule by mouth daily.     NON FORMULARY    2 (two) times daily. occu support tabs bid  Modified Medications   No medications on file  Discontinued Medications   No medications on file     Physical Exam:  Vitals:   04/24/16 0900  BP: (!) 148/70  Pulse: 68  Resp: 16  Temp: 97.6 F (36.4 C)  TempSrc: Oral  SpO2: 97%  Weight: 109 lb 6.4 oz (49.6 kg)  Height: 5\' 6"  (1.676 m)   Body mass index is 17.66 kg/m.  Physical Exam  Constitutional: She is oriented to person, place, and time. She appears well-developed and well-nourished. No distress.  HENT:  Head: Normocephalic and atraumatic.  Right Ear: External ear normal.  Left Ear: External ear normal.  Nose: Nose normal.  Mouth/Throat: Oropharynx is clear and moist.  Eyes: Conjunctivae and EOM are normal. Pupils are equal, round, and reactive to light.  Corrective lenses.  Neck: Normal range of motion. Neck supple. No JVD present. No tracheal  deviation present. No thyromegaly present.  Cardiovascular: Normal rate and regular rhythm.   Murmur heard. Pulmonary/Chest: Effort normal. No respiratory distress. She has no wheezes.  Abdominal: Soft. Bowel sounds are normal.  Musculoskeletal: Normal range of motion. She exhibits no edema or tenderness.       Left hip: Normal. She exhibits normal range of motion, normal strength and no tenderness.       Left knee: She exhibits effusion and ecchymosis. She exhibits normal range of motion, no deformity, no laceration, no erythema, normal alignment, no LCL laxity and normal patellar mobility.       Left ankle:  She exhibits swelling (+1 edema ) and ecchymosis. She exhibits normal range of motion, no deformity, no laceration and normal pulse. No tenderness. Achilles tendon exhibits no pain.  Lymphadenopathy:    She has no cervical adenopathy.  Neurological: She is alert and oriented to person, place, and time. She has normal reflexes. No cranial nerve deficit. Coordination normal.  Intact vibratory. 11/30/13 MMSE 26/30. Passed clock drawing. 12/06/14 MMSE 28/30. Passed clock drawing. 06/06/15 MMSE 28/30. Passed clock drawing. 12/11/15 MMSE 26/30. Passed clock drawing.  Skin: Skin is warm and dry. She is not diaphoretic. No erythema. No pallor.  multilpe purpuric areas. Scabs noted to right wrist Large area of ecchymosis to left hip, knee and lower leg (ankle and foot)   Psychiatric: She has a normal mood and affect. Her behavior is normal. Judgment and thought content normal.    Labs reviewed: Basic Metabolic Panel:  Recent Labs  09/25/15 1612 12/07/15 0831 01/27/16 0849  NA 140 136 138  K 5.1 4.6 4.6  CL 102 101 104  CO2 24 27 27   GLUCOSE 88 84 99  BUN 29* 26* 32*  CREATININE 1.61* 1.41* 1.45*  CALCIUM 9.7 9.5 9.4  TSH 2.02  --   --    Liver Function Tests:  Recent Labs  06/04/15 0839 09/25/15 1612 12/07/15 0831  AST 14 12 13   ALT 10 10 10   ALKPHOS 79 78 74  BILITOT  0.5 0.5 0.7  PROT 6.9 7.0 6.8  ALBUMIN 3.7 3.8 3.6   No results for input(s): LIPASE, AMYLASE in the last 8760 hours. No results for input(s): AMMONIA in the last 8760 hours. CBC:  Recent Labs  06/04/15 0839 09/25/15 1612 01/27/16 0849  WBC 5.8 6.3 9.6  NEUTROABS 3.7 3,969 7.7  HGB  --  13.5 13.0  HCT 41.1 40.3 40.9  MCV 93 93.3 97.4  PLT  --  157 165   Lipid Panel:  Recent Labs  06/04/15 0839 12/07/15 0831  CHOL 174 161  HDL 67 68  LDLCALC 94 79  TRIG 64 70  CHOLHDL 2.6 2.4   TSH:  Recent Labs  09/25/15 1612  TSH 2.02   A1C: Lab Results  Component Value Date   HGBA1C 5.9 (H) 06/04/2015     Assessment/Plan 1. Fall, initial encounter Instructed to get up slowly, to use appropriate foot wear.  No episodes of dizziness or reports of lightheaded with fall.  2. Contusion of left lower leg, initial encounter Bruising noted throughout left leg, minimal pain to knee, no pain to hip, ankle, foot or toes. May use ice PRN.   3. Knee pain -minimal pain to left knee, pt able to walk without pain or instability. to use ice as needed, to notify if pain worsens   Lindsey Salazar K. Harle Battiest  [facility] [phone] 8 am - 5 pm) [phone] (after hours)

## 2016-05-06 ENCOUNTER — Ambulatory Visit (INDEPENDENT_AMBULATORY_CARE_PROVIDER_SITE_OTHER): Payer: Medicare Other | Admitting: Internal Medicine

## 2016-05-06 ENCOUNTER — Encounter: Payer: Self-pay | Admitting: Internal Medicine

## 2016-05-06 VITALS — BP 182/94 | HR 63 | Temp 97.5°F | Ht 66.0 in | Wt 107.0 lb

## 2016-05-06 DIAGNOSIS — R739 Hyperglycemia, unspecified: Secondary | ICD-10-CM | POA: Diagnosis not present

## 2016-05-06 DIAGNOSIS — R413 Other amnesia: Secondary | ICD-10-CM

## 2016-05-06 DIAGNOSIS — I10 Essential (primary) hypertension: Secondary | ICD-10-CM

## 2016-05-06 DIAGNOSIS — N182 Chronic kidney disease, stage 2 (mild): Secondary | ICD-10-CM | POA: Diagnosis not present

## 2016-05-06 MED ORDER — LOSARTAN POTASSIUM 100 MG PO TABS: ORAL_TABLET | ORAL | 3 refills | Status: DC

## 2016-05-06 MED ORDER — MEMANTINE HCL-DONEPEZIL HCL ER 28-10 MG PO CP24: 1.0000 | ORAL_CAPSULE | Freq: Every day | ORAL | 5 refills | Status: DC

## 2016-05-06 MED ORDER — MEMANTINE HCL-DONEPEZIL HCL ER 28-10 MG PO CP24: 1.0000 | ORAL_CAPSULE | Freq: Every day | ORAL | 5 refills | Status: AC

## 2016-05-06 NOTE — Progress Notes (Signed)
Facility  [facility]    Place of Service:   OFFICE    Allergies  Allergen Reactions  . Penicillins     Chief Complaint  Patient presents with  . Medical Management of Chronic Issues    4 week follow-up on syncope, memory, blood pressure. "Just don't feel quite normal, head is not on straight". Here with husband, daughter [name]    HPI:  Memory change - tolerating Namzaric  Essential hypertension - BP elevated again Says she is taking all her medications  Chronic kidney disease, stage II (mild) - needs follow up lab  Hyperglycemia - follow lab    Medications: Patient's Medications  New Prescriptions   No medications on file  Previous Medications   ACETAMINOPHEN (TYLENOL) 500 MG TABLET    Take 500 mg by mouth every 6 (six) hours as needed.   ASPIRIN 81 MG TABLET    Take 81 mg by mouth. Take one tablet every other day   ATORVASTATIN (LIPITOR) 10 MG TABLET    TAKE 1 TABLET ONCE DAILY TO LOWER CHOLESTEROL.   CALCIUM-VITAMIN D PO    Take by mouth 2 (two) times daily.     DONEPEZIL (ARICEPT) 10 MG TABLET    One daily to help preserve memory   DOXAZOSIN (CARDURA) 4 MG TABLET    One daily to help control BP   LOSARTAN (COZAAR) 50 MG TABLET    TAKE 1 TABLET DAILY TO CONTROL BLOOD PRESSURE.   MEMANTINE HCL-DONEPEZIL HCL (NAMZARIC) 7 & 14 & 21 &28 -10 MG C4PK    Take 1 capsule by mouth Nightly.   METOPROLOL (LOPRESSOR) 50 MG TABLET    Take 50 mg by mouth. Take one tablet every 8 hours as needed for palpitations   MULTIPLE VITAMIN (MULTIVITAMIN) CAPSULE    Take 1 capsule by mouth daily.     NON FORMULARY    2 (two) times daily. occu support tabs bid  Modified Medications   No medications on file  Discontinued Medications   No medications on file    Review of Systems  Constitutional: Positive for fatigue. Negative for activity change, appetite change, chills and fever.  HENT: Negative for congestion and rhinorrhea.   Eyes:       Some deterioration ii vision recently according to  report from her ophth.  Respiratory: Positive for cough (mild dry cough). Negative for chest tightness and shortness of breath.   Cardiovascular: Positive for palpitations.  Gastrointestinal: Negative.  Negative for abdominal distention, abdominal pain, constipation, nausea and vomiting.  Endocrine: Negative.   Musculoskeletal: Positive for gait problem.       Fx sacrum 01/27/16.  Skin: Negative for wound.       Complains of easy bruising.  Neurological:       Complaint of some balance issues and falls.  Reports mild cognitive issues. Recurrent episodes of lightheadedness that have been associated with falls.  Psychiatric/Behavioral: Positive for sleep disturbance.       Wakes up after about an hour of sleep and is awake for another 3 hours.    Vitals:   05/06/16 1610  BP: (!) 182/94  Pulse: 63  Temp: 97.5 F (36.4 C)  TempSrc: Oral  SpO2: 97%  Weight: 107 lb (48.5 kg)  Height: _0  (1.676 m)   Body mass index is 17.27 kg/m. Wt Readings from Last 3 Encounters:  05/06/16 107 lb (48.5 kg)  04/24/16 109 lb 6.4 oz (49.6 kg)  04/08/16 104 lb (47.2 kg)  Physical Exam  Constitutional: She is oriented to person, place, and time. She appears well-developed and well-nourished. No distress.  HENT:  Head: Normocephalic and atraumatic.  Right Ear: External ear normal.  Left Ear: External ear normal.  Nose: Nose normal.  Mouth/Throat: Oropharynx is clear and moist.  Eyes: Conjunctivae and EOM are normal. Pupils are equal, round, and reactive to light.  Corrective lenses.  Neck: Normal range of motion. Neck supple. No JVD present. No tracheal deviation present. No thyromegaly present.  Cardiovascular: Normal rate and regular rhythm.   Murmur heard. Pulmonary/Chest: Effort normal. No respiratory distress. She has no wheezes.  Abdominal: Soft. Bowel sounds are normal.  Musculoskeletal: Normal range of motion. She exhibits no edema or tenderness.       Left hip: Normal. She  exhibits normal range of motion, normal strength and no tenderness.       Left knee: She exhibits effusion and ecchymosis. She exhibits normal range of motion, no deformity, no laceration, no erythema, normal alignment, no LCL laxity and normal patellar mobility.       Left ankle: She exhibits swelling (+1 edema ) and ecchymosis. She exhibits normal range of motion, no deformity, no laceration and normal pulse. No tenderness. Achilles tendon exhibits no pain.  Lymphadenopathy:    She has no cervical adenopathy.  Neurological: She is alert and oriented to person, place, and time. She has normal reflexes. No cranial nerve deficit. Coordination normal.  Intact vibratory. 11/30/13 MMSE 26/30. Passed clock drawing. 12/06/14 MMSE 28/30. Passed clock drawing. 06/06/15 MMSE 28/30. Passed clock drawing. 12/11/15 MMSE 26/30. Passed clock drawing.  Skin: Skin is warm and dry. She is not diaphoretic. No erythema. No pallor.  Psychiatric: She has a normal mood and affect. Her behavior is normal. Judgment and thought content normal.    Labs reviewed: Lab Summary Latest Ref Rng & Units 01/27/2016 12/07/2015 09/25/2015  Hemoglobin 12.0 - 15.0 g/dL 13.0 (None) 13.5  Hematocrit 36.0 - 46.0 % 40.9 (None) 40.3  White count 4.0 - 10.5 K/uL 9.6 (None) 6.3  Platelet count 150 - 400 K/uL 165 (None) 157  Sodium 135 - 145 mmol/L 138 136 140  Potassium 3.5 - 5.1 mmol/L 4.6 4.6 5.1  Calcium 8.9 - 10.3 mg/dL 9.4 9.5 9.7  Phosphorus - (None) (None) (None)  Creatinine 0.44 - 1.00 mg/dL 1.45(H) 1.41(H) 1.61(H)  AST 10 - 35 U/L (None) 13 12  Alk Phos 33 - 130 U/L (None) 74 78  Bilirubin 0.2 - 1.2 mg/dL (None) 0.7 0.5  Glucose 65 - 99 mg/dL 99 84 88  Cholesterol <200 mg/dL (None) 161 (None)  HDL cholesterol >50 mg/dL (None) 68 (None)  Triglycerides <150 mg/dL (None) 70 (None)  LDL Direct - (None) (None) (None)  LDL Calc <100 mg/dL (None) 79 (None)  Total protein 6.1 - 8.1 g/dL (None) 6.8 7.0  Albumin 3.6 - 5.1 g/dL  (None) 3.6 3.8  Some recent data might be hidden   Lab Results  Component Value Date   TSH 2.02 09/25/2015   TSH 1.28 04/15/2011   Lab Results  Component Value Date   BUN 32 (H) 01/27/2016   BUN 26 (H) 12/07/2015   BUN 29 (H) 09/25/2015   Lab Results  Component Value Date   HGBA1C 5.9 (H) 06/04/2015    Assessment/Plan  1. Memory change - Memantine HCl-Donepezil HCl (NAMZARIC) 28-10 MG CP24; Take 1 capsule by mouth daily. To help preserve memory  Dispense: 30 capsule; Refill: 5  2. Essential hypertension - Increase  losartan (COZAAR) 100 MG tablet; One daily to control BP  Dispense: 90 tablet; Refill: 3 - Comprehensive metabolic panel; Future  3. Chronic kidney disease, stage II (mild) - Comprehensive metabolic panel; Future  4. Hyperglycemia - Comprehensive metabolic panel; Future

## 2016-05-08 ENCOUNTER — Ambulatory Visit (INDEPENDENT_AMBULATORY_CARE_PROVIDER_SITE_OTHER): Payer: Medicare Other

## 2016-05-08 ENCOUNTER — Telehealth: Payer: Self-pay | Admitting: Internal Medicine

## 2016-05-08 ENCOUNTER — Ambulatory Visit (INDEPENDENT_AMBULATORY_CARE_PROVIDER_SITE_OTHER): Payer: Medicare Other | Admitting: Physician Assistant

## 2016-05-08 VITALS — BP 168/72 | HR 74 | Temp 97.5°F | Resp 16 | Ht 66.0 in | Wt 110.2 lb

## 2016-05-08 DIAGNOSIS — S0181XA Laceration without foreign body of other part of head, initial encounter: Secondary | ICD-10-CM | POA: Diagnosis not present

## 2016-05-08 DIAGNOSIS — N390 Urinary tract infection, site not specified: Secondary | ICD-10-CM

## 2016-05-08 DIAGNOSIS — R030 Elevated blood-pressure reading, without diagnosis of hypertension: Secondary | ICD-10-CM

## 2016-05-08 DIAGNOSIS — W19XXXA Unspecified fall, initial encounter: Secondary | ICD-10-CM

## 2016-05-08 DIAGNOSIS — R6884 Jaw pain: Secondary | ICD-10-CM

## 2016-05-08 LAB — POCT CBC
Granulocyte percent: 79.4 %G (ref 37–80)
HCT, POC: 36 % — AB (ref 37.7–47.9)
Hemoglobin: 12.1 g/dL — AB (ref 12.2–16.2)
Lymph, poc: 1.2 (ref 0.6–3.4)
MCH, POC: 32.3 pg — AB (ref 27–31.2)
MCHC: 33.8 g/dL (ref 31.8–35.4)
MCV: 95.6 fL (ref 80–97)
MID (cbc): 0.4 (ref 0–0.9)
MPV: 7.7 fL (ref 0–99.8)
POC Granulocyte: 6 (ref 2–6.9)
POC LYMPH PERCENT: 15.8 %L (ref 10–50)
POC MID %: 4.8 %M (ref 0–12)
Platelet Count, POC: 122 10*3/uL — AB (ref 142–424)
RBC: 3.77 M/uL — AB (ref 4.04–5.48)
RDW, POC: 14 %
WBC: 7.6 10*3/uL (ref 4.6–10.2)

## 2016-05-08 LAB — POCT URINALYSIS DIP (MANUAL ENTRY)
Bilirubin, UA: NEGATIVE
Glucose, UA: NEGATIVE mg/dL
Ketones, POC UA: NEGATIVE mg/dL
Nitrite, UA: POSITIVE — AB
Protein Ur, POC: NEGATIVE mg/dL
Spec Grav, UA: 1.01 (ref 1.010–1.025)
Urobilinogen, UA: 0.2 E.U./dL
pH, UA: 6 (ref 5.0–8.0)

## 2016-05-08 LAB — POC MICROSCOPIC URINALYSIS (UMFC): Mucus: ABSENT

## 2016-05-08 MED ORDER — CEPHALEXIN 500 MG PO CAPS: 500.0000 mg | ORAL_CAPSULE | Freq: Two times a day (BID) | ORAL | 0 refills | Status: DC

## 2016-05-08 NOTE — Patient Instructions (Addendum)
WOUND CARE Please return in 6 days to have your stitches/staples removed or sooner if you have concerns. Marland Kitchen Keep area clean and dry with dressing in place for 24 hours. . After 24 hours, remove bandage and wash wound gently with mild soap and warm water. When skin is dry, reapply a new bandage. Once wound is no longer draining, may leave wound open to air. . Continue daily cleansing with soap and water until stitches/staples are removed. . Do not apply any ointments or creams to the wound while stitches/staples are in place, as this may cause delayed healing. . Notify the office if you experience any of the following signs of infection: Swelling, redness, pus drainage, streaking, fever >101.0 F . Notify the office if you experience excessive bleeding that does not stop after 15-20 minutes of constant, firm pressure.  Start taking your antibiotics for your urinary tract infection today. Please stay well hydrated - drink 1-2 liters of water a day.   Thank you for coming in today. I hope you feel we met your needs.  Feel free to call [facility] if you have any questions or further requests.  Please consider signing up for MyChart if you do not already have it, as this is a great way to communicate with me.  Best,  Whitney McVey, PA-C     IF you received an x-ray today, you will receive an invoice from [facility]. Please contact [facility] at [phone] with questions or concerns regarding your invoice.   IF you received labwork today, you will receive an invoice from [facility]. Please contact [facility] at [phone] with questions or concerns regarding your invoice.   Our billing staff will not be able to assist you with questions regarding bills from these companies.  You will be contacted with the lab results as soon as they are available. The fastest way to get your results is to activate your My Chart account. Instructions are located on the last page of this  paperwork. If you have not heard from [place] regarding the results in 2 weeks, please contact this office.

## 2016-05-08 NOTE — Telephone Encounter (Signed)
Lindsey Salazar called about patient, stating that the patient was recently in the office Tuesday and that she has had another fall since then and is being taken to get possible stitches.  [name] just wanted to call and let the office and Dr. Nyoka Cowden know for records.

## 2016-05-08 NOTE — Progress Notes (Signed)
Lindsey Salazar  MRN: [id number] DOB: 06/22/1932  PCP: Jeanmarie Hubert, MD  Subjective:  Pt is an 83 year old female PMH HTN, CKD, mitral valve disorder, memory change, thrombocytopenia, who presents to clinic for cut on her chin s/p fall. She is here today with her son.  She was walking across her street today to [employer] for a neighbor when she went down. Pt cannot recall what made her fall.  Denies lightheadedness, dizziness, abdominal pain, fever, chills, chest pain, shob, orthopnea, visual changes, headache, n/v.  Pt is taking daily aspirin.   Her son states this is her 5th fall in as many months. He is concerned that she is falling more often. Pt has appt with her PCP next month.   Review of Systems  Constitutional: Negative for chills, fatigue and fever.  Genitourinary: Negative for decreased urine volume, dysuria, flank pain, hematuria and urgency.  Musculoskeletal: Positive for arthralgias. Negative for myalgias, neck pain and neck stiffness.  Skin: Positive for wound.  Neurological: Negative for weakness and light-headedness.  Psychiatric/Behavioral: Negative for confusion.    Patient Active Problem List   Diagnosis Date Noted  . Wound, open, forearm 01/30/2016  . Fracture, sacrum/coccyx ([place]) 01/30/2016  . Fall 01/30/2016  . Syncope 01/30/2016  . Light-headed feeling 09/25/2015  . Senile purpura ([place]) 06/06/2015  . Palpitations 05/30/2014  . Memory change 11/30/2013  . Hyperglycemia 11/23/2012  . Chronic kidney disease, stage II (mild) 05/18/2012  . Thrombocytopenia, unspecified ([place]) 05/18/2012  . Mitral valve disorder 05/18/2012  . Senile cataract, unspecified 05/18/2012  . Abnormal EKG 04/15/2011  . HTN (hypertension) 04/15/2011  . Hyperlipidemia     Current Outpatient Prescriptions on File Prior to Visit  Medication Sig Dispense Refill  . acetaminophen (TYLENOL) 500 MG tablet Take 500 mg by mouth every 6 (six) hours as needed.    Marland Kitchen aspirin 81 MG  tablet Take 81 mg by mouth. Take one tablet every other day    . atorvastatin (LIPITOR) 10 MG tablet TAKE 1 TABLET ONCE DAILY TO LOWER CHOLESTEROL. 90 tablet 1  . CALCIUM-VITAMIN D PO Take by mouth 2 (two) times daily.      Marland Kitchen doxazosin (CARDURA) 4 MG tablet One daily to help control BP 30 tablet 5  . losartan (COZAAR) 100 MG tablet One daily to control BP 90 tablet 3  . metoprolol (LOPRESSOR) 50 MG tablet Take 50 mg by mouth. Take one tablet every 8 hours as needed for palpitations    . Multiple Vitamin (MULTIVITAMIN) capsule Take 1 capsule by mouth daily.      . NON FORMULARY 2 (two) times daily. occu support tabs bid     No current facility-administered medications on file prior to visit.     Allergies  Allergen Reactions  . Penicillins      Objective:  BP (!) 163/72 (BP Location: Right Arm, Patient Position: Sitting, Cuff Size: Small)   Pulse 74   Temp 97.5 F (36.4 C) (Oral)   Resp 16   Ht 5' 6" (1.676 m)   Wt 110 lb 3.2 oz (50 kg)   SpO2 96%   BMI 17.79 kg/m   Physical Exam  Constitutional: She is oriented to person, place, and time and well-developed, well-nourished, and in no distress. No distress.  HENT:  No loose teeth, teeth not TTP, no blood visualized, tongue is not lacerated or otherwise injured.   Neck: Normal range of motion and full passive range of motion without pain. Neck supple. No  spinous process tenderness and no muscular tenderness present. Normal range of motion present.    Cardiovascular: Normal rate, regular rhythm and normal heart sounds.   Pulmonary/Chest: Effort normal and breath sounds normal. She exhibits no tenderness, no bony tenderness, no crepitus and no retraction.  Musculoskeletal:       Right shoulder: Normal.       Left shoulder: Normal.       Right elbow: Normal.      Left elbow: Normal.       Right wrist: Normal.       Left wrist: She exhibits normal range of motion, no tenderness, no bony tenderness, no swelling, no crepitus and no  deformity.       Right knee: Normal.       Left knee: Normal.  Neurological: She is alert and oriented to person, place, and time. GCS score is 15.  Skin: Skin is warm and dry.  Psychiatric: Mood, memory, affect and judgment normal.  Vitals reviewed.   Results for orders placed or performed in visit on 05/08/16  POCT Microscopic Urinalysis ([facility])  Result Value Ref Range   WBC,UR,HPF,POC Many (A) None WBC/hpf   RBC,UR,HPF,POC Few (A) None RBC/hpf   Bacteria Too numerous to count  None, Too numerous to count   Mucus Absent Absent   Epithelial Cells, UR Per Microscopy Few (A) None, Too numerous to count cells/hpf  POCT urinalysis dipstick  Result Value Ref Range   Color, UA yellow yellow   Clarity, UA cloudy (A) clear   Glucose, UA negative negative mg/dL   Bilirubin, UA negative negative   Ketones, POC UA negative negative mg/dL   Spec Grav, UA 1.010 1.010 - 1.025   Blood, UA moderate (A) negative   pH, UA 6.0 5.0 - 8.0   Protein Ur, POC negative negative mg/dL   Urobilinogen, UA 0.2 0.2 or 1.0 E.U./dL   Nitrite, UA Positive (A) Negative   Leukocytes, UA Large (3+) (A) Negative  POCT CBC  Result Value Ref Range   WBC 7.6 4.6 - 10.2 K/uL   Lymph, poc 1.2 0.6 - 3.4   POC LYMPH PERCENT 15.8 10 - 50 %L   MID (cbc) 0.4 0 - 0.9   POC MID % 4.8 0 - 12 %M   POC Granulocyte 6.0 2 - 6.9   Granulocyte percent 79.4 37 - 80 %G   RBC 3.77 (A) 4.04 - 5.48 M/uL   Hemoglobin 12.1 (A) 12.2 - 16.2 g/dL   HCT, POC 36.0 (A) 37.7 - 47.9 %   MCV 95.6 80 - 97 fL   MCH, POC 32.3 (A) 27 - 31.2 pg   MCHC 33.8 31.8 - 35.4 g/dL   RDW, POC 14.0 %   Platelet Count, POC 122 (A) 142 - 424 K/uL   MPV 7.7 0 - 99.8 fL   Dg Mandible 4 Views  Result Date: 05/08/2016 CLINICAL DATA:  Mandibular pain following a fall. EXAM: MANDIBLE - 4+ VIEW COMPARISON:  Head CT dated 02/05/2016. FINDINGS: The visualized portion of the mandible has a normal appearance with no fracture or dislocation seen. Mild upper  cervical spine degenerative changes are noted. IMPRESSION: Limited visualization of the mandible with no fracture or dislocation seen. If there is a clinical concern for fracture or dislocation, a maxillofacial CT without contrast would be recommended. Electronically Signed   By: Claudie Revering M.D.   On: 05/08/2016 16:45   Procedure: Verbal consent obtained. Skin was anesthetized with lidocaine with epinephrine and  cleaned with NS irrigation. Wound was explored and no deep structures involved. Laceration was sutured with 6 sutures. Wound was dressed and wound care discussed.  Assessment and Plan :  1. Chin laceration, initial encounter 2. Jaw pain - DG Mandible 4 Views; Future - No subcutaneous emphysema or fracture on x-ray. Her physical exam is negative for any further injury s/p fall. Wound care discussed with pt. Laceration sutured with 6 sutures. RTC in 6 days for possible suture removal.   3. Elevated blood pressure reading - Recheck vitals - pt is not orthostatic. Her blood pressure decreased from 182/94 to 168/72. Suspect HTN due to pain.   4. Fall, initial encounter - Urine culture - POCT Microscopic Urinalysis ([facility]) - POCT urinalysis dipstick - POCT CBC - Orthostatic vital signs - CMP14+EGFR  5. Urinary tract infection without hematuria, site unspecified - cephALEXin (KEFLEX) 500 MG capsule; Take 1 capsule (500 mg total) by mouth 2 (two) times daily.  Dispense: 14 capsule; Refill: 0 - Culture is pending. Will treat for UTI. Suspect complication of urinary tract infection led to pt's fall. Discussed with pt's son to be sure pt f/u with her PCP regarding her increasing episodes of falls recently.    Mercer Pod, PA-C  [facility] [place] 05/08/2016 4:20 PM

## 2016-05-09 LAB — CMP14+EGFR
ALT: 28 IU/L (ref 0–32)
AST: 30 IU/L (ref 0–40)
Albumin/Globulin Ratio: 1.5 (ref 1.2–2.2)
Albumin: 3.9 g/dL (ref 3.5–4.7)
Alkaline Phosphatase: 80 IU/L (ref 39–117)
BUN/Creatinine Ratio: 17 (ref 12–28)
BUN: 26 mg/dL (ref 8–27)
Bilirubin Total: 0.5 mg/dL (ref 0.0–1.2)
CO2: 26 mmol/L (ref 18–29)
Calcium: 8.9 mg/dL (ref 8.7–10.3)
Chloride: 100 mmol/L (ref 96–106)
Creatinine, Ser: 1.54 mg/dL — ABNORMAL HIGH (ref 0.57–1.00)
GFR calc Af Amer: 36 mL/min/{1.73_m2} — ABNORMAL LOW (ref >59)
GFR calc non Af Amer: 31 mL/min/{1.73_m2} — ABNORMAL LOW (ref >59)
Globulin, Total: 2.6 g/dL (ref 1.5–4.5)
Glucose: 101 mg/dL — ABNORMAL HIGH (ref 65–99)
Potassium: 4.3 mmol/L (ref 3.5–5.2)
Sodium: 140 mmol/L (ref 134–144)
Total Protein: 6.5 g/dL (ref 6.0–8.5)

## 2016-05-10 LAB — URINE CULTURE

## 2016-05-10 NOTE — Telephone Encounter (Signed)
noted 

## 2016-05-12 ENCOUNTER — Telehealth: Payer: Self-pay

## 2016-05-12 NOTE — Telephone Encounter (Signed)
Medicare denied coverage for Namzaric. Denial letter placed in Dr. Hervey Ard review and sign folder.  Member # [id number] Date of denial 05/10/2016

## 2016-05-14 ENCOUNTER — Ambulatory Visit (INDEPENDENT_AMBULATORY_CARE_PROVIDER_SITE_OTHER): Payer: Medicare Other | Admitting: Physician Assistant

## 2016-05-14 VITALS — BP 166/80 | HR 79 | Temp 98.0°F | Resp 17 | Ht 65.5 in | Wt 112.0 lb

## 2016-05-14 DIAGNOSIS — S0181XD Laceration without foreign body of other part of head, subsequent encounter: Secondary | ICD-10-CM

## 2016-05-14 DIAGNOSIS — Z4802 Encounter for removal of sutures: Secondary | ICD-10-CM

## 2016-05-14 NOTE — Progress Notes (Signed)
Chief Complaint  Patient presents with  . Suture / Staple Removal    chin, placed here 05/08/2016    History of Present Illness: Patient presents for suture removal. She is accompanied by her husband.  She presented here on 05/08/2016 after a fall, in which she sustained a laceration to the underside of her chin. Her son related a number of recent falls, and the patient was found to have a UTI. She was prescribed cephalexin.  The wound was closed with 6 sutures.  Today she reports feeling well.  No pain, swelling, drainage. Tolerated the cephalexin well. No additional falls.   Allergies  Allergen Reactions  . Penicillins     Prior to Admission medications   Medication Sig Start Date End Date Taking? Authorizing Provider  acetaminophen (TYLENOL) 500 MG tablet Take 500 mg by mouth every 6 (six) hours as needed.   Yes Historical Provider, MD  aspirin 81 MG tablet Take 81 mg by mouth. Take one tablet every other day   Yes Historical Provider, MD  atorvastatin (LIPITOR) 10 MG tablet TAKE 1 TABLET ONCE DAILY TO LOWER CHOLESTEROL. 02/25/16  Yes Estill Dooms, MD  CALCIUM-VITAMIN D PO Take by mouth 2 (two) times daily.     Yes Historical Provider, MD  cephALEXin (KEFLEX) 500 MG capsule Take 1 capsule (500 mg total) by mouth 2 (two) times daily. 05/08/16  Yes Elizabeth Whitney McVey, PA-C  doxazosin (CARDURA) 4 MG tablet One daily to help control BP 04/08/16  Yes Estill Dooms, MD  losartan (COZAAR) 100 MG tablet One daily to control BP 05/06/16  Yes Estill Dooms, MD  metoprolol (LOPRESSOR) 50 MG tablet Take 50 mg by mouth. Take one tablet every 8 hours as needed for palpitations   Yes Historical Provider, MD  Multiple Vitamin (MULTIVITAMIN) capsule Take 1 capsule by mouth daily.     Yes Historical Provider, MD  NON FORMULARY 2 (two) times daily. occu support tabs bid   Yes Historical Provider, MD    Patient Active Problem List   Diagnosis Date Noted  . Wound, open, forearm  01/30/2016  . Fracture, sacrum/coccyx ([place]) 01/30/2016  . Fall 01/30/2016  . Syncope 01/30/2016  . Light-headed feeling 09/25/2015  . Senile purpura ([place]) 06/06/2015  . Palpitations 05/30/2014  . Memory change 11/30/2013  . Hyperglycemia 11/23/2012  . Chronic kidney disease, stage II (mild) 05/18/2012  . Thrombocytopenia, unspecified ([place]) 05/18/2012  . Mitral valve disorder 05/18/2012  . Senile cataract, unspecified 05/18/2012  . Abnormal EKG 04/15/2011  . HTN (hypertension) 04/15/2011  . Hyperlipidemia      Physical Exam  Constitutional: She is oriented to person, place, and time. She appears well-developed and well-nourished. She is active and cooperative. No distress.  BP (!) 166/80   Pulse 79   Temp 98 F (36.7 C) (Oral)   Resp 17   Ht 5' 5.5" (1.664 m)   Wt 112 lb (50.8 kg)   SpO2 97%   BMI 18.35 kg/m    Eyes: Conjunctivae are normal.  Pulmonary/Chest: Effort normal.  Neurological: She is alert and oriented to person, place, and time.  Skin: Skin is warm and dry. Laceration (well-healed, #6 sutures removed without incident.) noted.  Psychiatric: She has a normal mood and affect. Her speech is normal and behavior is normal.      ASSESSMENT & PLAN:  1. Encounter for removal of sutures 2. Chin laceration, subsequent encounter Local wound care. Follow-up with PCP regarding falls.  No Follow-up on file.   Fara Chute, PA-C [facility] [place]

## 2016-05-14 NOTE — Patient Instructions (Signed)
     IF you received an x-ray today, you will receive an invoice from [facility]. Please contact [facility] at [phone] with questions or concerns regarding your invoice.   IF you received labwork today, you will receive an invoice from [facility]. Please contact [facility] at [phone] with questions or concerns regarding your invoice.   Our billing staff will not be able to assist you with questions regarding bills from these companies.  You will be contacted with the lab results as soon as they are available. The fastest way to get your results is to activate your My Chart account. Instructions are located on the last page of this paperwork. If you have not heard from us regarding the results in 2 weeks, please contact this office.     

## 2016-05-14 NOTE — Progress Notes (Signed)
THIS NOTE IS USED FOR EDUCATIONAL PURPOSES ONLY!!!   Name: Lindsey Salazar  DOB: 06/06/1932  Age: 83 y.o. Sex: female  CC: No chief complaint on file.   PCP: Jeanmarie Hubert, MD  HPI: Patient reports today for suture removal from fall on 05/08/16. Please see note from that day for description of fall.   Patient reports she hasn't noticed the wound at all. No pain. No fevers, chills.   Per previous note- family has concern for increase in falls. Patient is currently seen by senior center MD.   ROS:  Constitutional: Negative for activity change, appetite change, fatigue and unexpected weight change.  HENT: Negative for congestion, dental problem, ear pain, hearing loss, mouth sores, postnasal drip, rhinorrhea, sneezing, sore throat, tinnitus and trouble swallowing.   Eyes: Negative for photophobia, pain, redness and visual disturbance.  Respiratory: Negative for cough, chest tightness and shortness of breath.   Cardiovascular: Negative for chest pain, palpitations and leg swelling.  Gastrointestinal: Negative for abdominal pain, blood in stool, constipation, diarrhea, nausea and vomiting.  Genitourinary: Negative for dysuria, frequency, hematuria and urgency.  Musculoskeletal: Negative for arthralgias, gait problem, myalgias and neck stiffness.  Skin: Abrasion under chin with 6 stiches in place.  Neurological: Negative for dizziness, speech difficulty, weakness, light-headedness, numbness and headaches.  Hematological: Negative for adenopathy.  Psychiatric/Behavioral: Negative for confusion and sleep disturbance. The patient is not nervous/anxious.    PMH:  Patient Active Problem List   Diagnosis Date Noted  . Wound, open, forearm 01/30/2016  . Fracture, sacrum/coccyx ([place]) 01/30/2016  . Fall 01/30/2016  . Syncope 01/30/2016  . Light-headed feeling 09/25/2015  . Senile purpura ([place]) 06/06/2015  . Palpitations 05/30/2014  . Memory change 11/30/2013  . Hyperglycemia 11/23/2012   . Chronic kidney disease, stage II (mild) 05/18/2012  . Thrombocytopenia, unspecified ([place]) 05/18/2012  . Mitral valve disorder 05/18/2012  . Senile cataract, unspecified 05/18/2012  . Abnormal EKG 04/15/2011  . HTN (hypertension) 04/15/2011  . Hyperlipidemia     Allergies:  Allergies  Allergen Reactions  . Penicillins     Medications:  Current Outpatient Prescriptions on File Prior to Visit  Medication Sig Dispense Refill  . acetaminophen (TYLENOL) 500 MG tablet Take 500 mg by mouth every 6 (six) hours as needed.    Marland Kitchen aspirin 81 MG tablet Take 81 mg by mouth. Take one tablet every other day    . atorvastatin (LIPITOR) 10 MG tablet TAKE 1 TABLET ONCE DAILY TO LOWER CHOLESTEROL. 90 tablet 1  . CALCIUM-VITAMIN D PO Take by mouth 2 (two) times daily.      . cephALEXin (KEFLEX) 500 MG capsule Take 1 capsule (500 mg total) by mouth 2 (two) times daily. 14 capsule 0  . doxazosin (CARDURA) 4 MG tablet One daily to help control BP 30 tablet 5  . losartan (COZAAR) 100 MG tablet One daily to control BP 90 tablet 3  . metoprolol (LOPRESSOR) 50 MG tablet Take 50 mg by mouth. Take one tablet every 8 hours as needed for palpitations    . Multiple Vitamin (MULTIVITAMIN) capsule Take 1 capsule by mouth daily.      . NON FORMULARY 2 (two) times daily. occu support tabs bid     No current facility-administered medications on file prior to visit.     PE:  GS: WDWN female sitting on exam table in NAD.  Vitals: BP (!) 166/80   Pulse 79   Temp 98 F (36.7 C) (Oral)   Resp 17  Ht 5' 5.5" (1.664 m)   Wt 112 lb (50.8 kg)   SpO2 97%   BMI 18.35 kg/m  HEENT: Normocephalic, atruamatic. PEARRL. No cervical lymphadenopathy. No thyroid nodules, normal size, and equal bilaterally. Ears: Bilateral ears without erythema, TM clear with good coen of light. TM without bulging. Nose: Patent, without erythema or discharge. Mouth/Throat: Moist mucous membranes. No erythema. Tonsils without erythema or  tonsillar exudate.  Cardiovascular: RRR. No S3 or S4. No murmurs, rubs, or gallops. Pulses 2+ and equal bilateral in the upper and lower extremities. No pitting edema. No varicosities, clubbing, or cyanosis.  Pulm: CTA bilaterally. No expiratory muscle use while breathing.  GI: +BS. NTND. No rigidity or guarding. No rebound tenderness.  Neuro: CN 2-12 grossly intact.  Psych: A&O x 4. Mood and affect appropriate for situation.  Skin: Warm and dry. No rashes or excoriations on exposed skin.   A&P:        Respectfully,  Delilah Shan, PA-S2

## 2016-05-20 ENCOUNTER — Other Ambulatory Visit: Payer: Self-pay

## 2016-05-20 MED ORDER — DONEPEZIL HCL 10 MG PO TABS: 10.0000 mg | ORAL_TABLET | Freq: Every day | ORAL | 5 refills | Status: DC

## 2016-05-20 MED ORDER — MEMANTINE HCL ER 28 MG PO CP24: 28.0000 mg | ORAL_CAPSULE | Freq: Every day | ORAL | 5 refills | Status: DC

## 2016-05-20 NOTE — Telephone Encounter (Signed)
A letter was received from Medicare that stated the PA for namzaric was denied.   Dr. Nyoka Cowden instructed the following medications to be sent to pharmacy for patient:  Donepezil 10 mg: Take 1 tablet by mouth daily.  Memantine XR 28: Take 1 tablet by mouth daily.   Patient was notified and agreed to this change. Prescriptions were sent to [facility].

## 2016-06-02 ENCOUNTER — Other Ambulatory Visit: Payer: Medicare Other

## 2016-06-02 DIAGNOSIS — I10 Essential (primary) hypertension: Secondary | ICD-10-CM

## 2016-06-02 DIAGNOSIS — R739 Hyperglycemia, unspecified: Secondary | ICD-10-CM

## 2016-06-02 DIAGNOSIS — N182 Chronic kidney disease, stage 2 (mild): Secondary | ICD-10-CM

## 2016-06-02 LAB — COMPREHENSIVE METABOLIC PANEL
ALT: 14 U/L (ref 6–29)
AST: 15 U/L (ref 10–35)
Albumin: 3.7 g/dL (ref 3.6–5.1)
Alkaline Phosphatase: 88 U/L (ref 33–130)
BUN: 27 mg/dL — AB (ref 7–25)
CHLORIDE: 105 mmol/L (ref 98–110)
CO2: 25 mmol/L (ref 20–31)
CREATININE: 1.62 mg/dL — AB (ref 0.60–0.88)
Calcium: 9.1 mg/dL (ref 8.6–10.4)
Glucose, Bld: 85 mg/dL (ref 65–99)
Potassium: 4.7 mmol/L (ref 3.5–5.3)
SODIUM: 138 mmol/L (ref 135–146)
TOTAL PROTEIN: 6.3 g/dL (ref 6.1–8.1)
Total Bilirubin: 0.7 mg/dL (ref 0.2–1.2)

## 2016-06-06 ENCOUNTER — Ambulatory Visit (INDEPENDENT_AMBULATORY_CARE_PROVIDER_SITE_OTHER): Payer: Medicare Other | Admitting: Internal Medicine

## 2016-06-06 ENCOUNTER — Encounter: Payer: Self-pay | Admitting: Internal Medicine

## 2016-06-06 VITALS — BP 150/70 | HR 64 | Temp 98.3°F | Wt 107.0 lb

## 2016-06-06 DIAGNOSIS — G3184 Mild cognitive impairment, so stated: Secondary | ICD-10-CM | POA: Diagnosis not present

## 2016-06-06 DIAGNOSIS — I471 Supraventricular tachycardia, unspecified: Secondary | ICD-10-CM

## 2016-06-06 DIAGNOSIS — N182 Chronic kidney disease, stage 2 (mild): Secondary | ICD-10-CM | POA: Diagnosis not present

## 2016-06-06 DIAGNOSIS — R300 Dysuria: Secondary | ICD-10-CM | POA: Diagnosis not present

## 2016-06-06 DIAGNOSIS — I10 Essential (primary) hypertension: Secondary | ICD-10-CM | POA: Diagnosis not present

## 2016-06-06 LAB — POCT URINALYSIS DIPSTICK
Bilirubin, UA: NEGATIVE
Blood, UA: NEGATIVE
Glucose, UA: NEGATIVE
Ketones, UA: NEGATIVE
Nitrite, UA: NEGATIVE
Protein, UA: NEGATIVE
Spec Grav, UA: <=1.005 — AB (ref 1.010–1.025)
Urobilinogen, UA: NEGATIVE E.U./dL — AB
pH, UA: 6.5 (ref 5.0–8.0)

## 2016-06-06 MED ORDER — NEBIVOLOL HCL 5 MG PO TABS: 5.0000 mg | ORAL_TABLET | Freq: Every day | ORAL | 0 refills | Status: DC

## 2016-06-06 NOTE — Progress Notes (Signed)
Location:  [facility] clinic Provider:  Clarice Zulauf L. Mariea Clonts, D.O., C.M.D. Previous PCP:  Dr. Nyoka Cowden  Code Status: full code as no document otherwise on file Goals of Care:  Advanced Directives 06/06/2016  Does Patient Have a Medical Advance Directive? Yes  Type of Paramedic of [place];Living will  Does patient want to make changes to medical advance directive? -  Copy of [place] in Chart? Yes    Chief Complaint  Patient presents with  . Medical Management of Chronic Issues    4week follow-up    HPI: Patient is a 83 y.o. female seen today for medical management of chronic diseases.    She's been seeing Dr. Nyoka Cowden and is now establishing with me to continue her primary care.  She has a h/o "memory changes" and is on namzaric, now max dose after appt 4/17.  She completed the titration.  Then the insurance would not cover the namzaric so she was back to taking the two meds together so they may have been missed temporarily.   They do use a weekly pillbox.  She does fill her own pillbox.  Her daughter straightened out the memory medicine situation.  Her daughter has Pharmacist, community access.  Has not missed pills otherwise.  No difficulty with getting ready in the morning.  They had been walking more than 2 miles but not much the past several days.  She's the church organist.  She wears a life alert at home and an outside gps.    MMSE - Mini Mental State Exam 12/11/2015 06/06/2015 12/06/2014  Orientation to time 5 5 5   Orientation to Place 5 5 5   Registration 3 3 3   Attention/ Calculation 4 5 5   Recall 1 1 1   Language- name 2 objects 2 2 2   Language- repeat 1 1 1   Language- follow 3 step command 3 3 3   Language- read & follow direction 1 1 1   Write a sentence 1 1 1   Copy design 0 1 1  Total score 26 28 28    She has chronic kidney disease with last cr 1.62 06/02/16.    Hypertension:  bp remains just slightly elevated here today.  On losartan.   Here the cuff  from home measure 163/97, but here it's 150/70.  It is an arm cuff.    Says she's not hungry but she eats a lot--eats a good breakfast especially. They eat healthy meals.    Does not sleep well--wakes up bleary eyed.  Goes to bed very sleepy, but mind gets up and runs around.  Not constant, but some nights sleeps better than others.  Hyperglycemia:   Lab Results  Component Value Date   HGBA1C 5.9 (H) 06/04/2015    It sounds like she's been falling and had an episode of syncope.  She had a CT brain, carotid dopplers, EEG and holter monitor ordered in Jan.  She broke her sacrum at one point also and had a wound on her forearm.  Ct head showed diffuse atrophy, but no acute changes.  Holter monitor showed NSR with pacs and pvcs, bigeminy, short runs of SVT up to 154 bpm, no afib on 02/14/16.  She had taken her lopressor the morning of the syncopal episode.    She has a feeling of not feeling right, and fallen, and she's had two occasions where she's had a UTI.  She has a giant pitcher for drinking water (on mother's day).  Burna Cash is just over a  liter.  She did finish her pitcher yesterday.  No dysuria, once in a while a touch of leakage, no frequency (only up once at night), No abdominal pain.    No energy, head feels strange.   Past Medical History:  Diagnosis Date  . Acute bronchitis   . Benign neoplasm of uterus, part unspecified   . Chronic ischemic heart disease, unspecified   . Chronic kidney disease, stage II (mild)   . Cough   . Encounter for long-term (current) use of other medications   . Ganglion, unspecified   . Hearing loss   . Hyperlipidemia   . Hypertension   . Impacted cerumen   . Light-headed feeling 09/25/2015  . Macular degeneration (senile) of retina, unspecified   . Mitral valve disorders(424.0)   . Nonspecific abnormal electrocardiogram (ECG) (EKG)   . Nonspecific abnormal electrocardiogram (ECG) (EKG)   . Osteoporosis, unspecified   . Other abnormal blood  chemistry   . Other emphysema ([place])   . Other malaise and fatigue   . Palpitations   . Senile cataract, unspecified   . Senile purpura ([place]) 06/06/2015  . Thrombocytopenia, unspecified ([place])   . Unspecified hearing loss     Past Surgical History:  Procedure Laterality Date  . KNEE ARTHROSCOPY     right    Allergies  Allergen Reactions  . Penicillins     Allergies as of 06/06/2016      Reactions   Penicillins       Medication List       Accurate as of 06/06/16  9:59 AM. Always use your most recent med list.          acetaminophen 500 MG tablet Commonly known as:  TYLENOL Take 500 mg by mouth every 6 (six) hours as needed.   aspirin 81 MG tablet Take 81 mg by mouth. Take one tablet every other day   atorvastatin 10 MG tablet Commonly known as:  LIPITOR TAKE 1 TABLET ONCE DAILY TO LOWER CHOLESTEROL.   CALCIUM-VITAMIN D PO Take by mouth 2 (two) times daily.   donepezil 10 MG tablet Commonly known as:  ARICEPT Take 1 tablet (10 mg total) by mouth at bedtime.   doxazosin 4 MG tablet Commonly known as:  CARDURA One daily to help control BP   losartan 100 MG tablet Commonly known as:  COZAAR One daily to control BP   memantine 28 MG Cp24 24 hr capsule Commonly known as:  NAMENDA XR Take 1 capsule (28 mg total) by mouth daily.   metoprolol tartrate 50 MG tablet Commonly known as:  LOPRESSOR Take 50 mg by mouth. Take one tablet every 8 hours as needed for palpitations       Review of Systems:  Review of Systems  Constitutional: Positive for malaise/fatigue. Negative for chills, fever and weight loss.  HENT: Negative for congestion and hearing loss.   Eyes: Negative for blurred vision.  Respiratory: Negative for cough and shortness of breath.   Cardiovascular: Negative for chest pain, palpitations and leg swelling.  Gastrointestinal: Negative for abdominal pain, blood in stool, constipation, diarrhea and melena.  Genitourinary: Negative for dysuria.    Musculoskeletal: Positive for falls. Negative for myalgias.  Neurological: Positive for loss of consciousness. Negative for dizziness and weakness.  Psychiatric/Behavioral: Positive for memory loss.    Health Maintenance  Topic Date Due  . PNA vac Low Risk Adult (2 of 2 - PCV13) 11/23/2013  . INFLUENZA VACCINE  08/20/2016  . MAMMOGRAM  11/22/2016  .  TETANUS/TDAP  11/26/2022  . DEXA SCAN  Completed    Physical Exam: Vitals:   06/06/16 0949  BP: (!) 150/70  Pulse: 64  Temp: 98.3 F (36.8 C)  TempSrc: Oral  SpO2: 97%  Weight: 107 lb (48.5 kg)   Body mass index is 17.53 kg/m. Physical Exam  Constitutional: She appears well-developed and well-nourished. No distress.  Cardiovascular: Normal rate, regular rhythm and intact distal pulses.   Murmur heard. Pulmonary/Chest: Breath sounds normal. No respiratory distress.  Abdominal: Soft. Bowel sounds are normal. She exhibits no distension. There is no tenderness.  Musculoskeletal: Normal range of motion.  Neurological: She is alert.  Oriented, but poor historian  Skin: Skin is warm and dry. Capillary refill takes less than 2 seconds.  Psychiatric: She has a normal mood and affect.    Labs reviewed: Basic Metabolic Panel:  Recent Labs  09/25/15 1612  01/27/16 0849 05/08/16 1728 06/02/16 0830  NA 140  < > 138 140 138  K 5.1  < > 4.6 4.3 4.7  CL 102  < > 104 100 105  CO2 24  < > 27 26 25   GLUCOSE 88  < > 99 101* 85  BUN 29*  < > 32* 26 27*  CREATININE 1.61*  < > 1.45* 1.54* 1.62*  CALCIUM 9.7  < > 9.4 8.9 9.1  TSH 2.02  --   --   --   --   < > = values in this interval not displayed. Liver Function Tests:  Recent Labs  12/07/15 0831 05/08/16 1728 06/02/16 0830  AST 13 30 15   ALT 10 28 14   ALKPHOS 74 80 88  BILITOT 0.7 0.5 0.7  PROT 6.8 6.5 6.3  ALBUMIN 3.6 3.9 3.7   No results for input(s): LIPASE, AMYLASE in the last 8760 hours. No results for input(s): AMMONIA in the last 8760 hours. CBC:  Recent  Labs  09/25/15 1612 01/27/16 0849 05/08/16 1652  WBC 6.3 9.6 7.6  NEUTROABS 3,969 7.7  --   HGB 13.5 13.0 12.1*  HCT 40.3 40.9 36.0*  MCV 93.3 97.4 95.6  PLT 157 165  --    Lipid Panel:  Recent Labs  12/07/15 0831  CHOL 161  HDL 68  LDLCALC 79  TRIG 70  CHOLHDL 2.4   Lab Results  Component Value Date   HGBA1C 5.9 (H) 06/04/2015    Procedures since last visit: Dg Mandible 4 Views  Result Date: 05/08/2016 CLINICAL DATA:  Mandibular pain following a fall. EXAM: MANDIBLE - 4+ VIEW COMPARISON:  Head CT dated 02/05/2016. FINDINGS: The visualized portion of the mandible has a normal appearance with no fracture or dislocation seen. Mild upper cervical spine degenerative changes are noted. IMPRESSION: Limited visualization of the mandible with no fracture or dislocation seen. If there is a clinical concern for fracture or dislocation, a maxillofacial CT without contrast would be recommended. Electronically Signed   By: Claudie Revering M.D.   On: 05/08/2016 16:45    Assessment/Plan 1. Essential hypertension -bp here elevated also, will not make changes at first appt--daughter to get regular cuff and monitor -also d/c cardura which is likely contributing to orthostasis and falls given MOA  2. Dysuria - does not sound like she has an infection, but her daughter is adamant that she had the same feeling in her head when that was the case before - POCT urinalysis dipstick not very remarkable - Urine culture  3. Chronic kidney disease, stage II (mild) -maintain hydration to  prevent #2 and worsening here, avoid nephrotoxic agents and dose adjust as appropriate, monitor  4. Paroxysmal SVT (supraventricular tachycardia) (HCC) -could also have contributed to falls, cont to monitor -try her on bystolic in place of cardura--5mg  samples given, monitor bp  5. Mild cognitive impairment with memory loss -monitor mmse annually, seems to have good support system with her daughter -encouraged  life alert use  Labs/tests ordered:   Orders Placed This Encounter  Procedures  . Urine culture  . POCT urinalysis dipstick   Instructions:  Stop your cardura. Start the sample pills I gave you:  bystolic 5mg  daily each morning. Continue blood pressure checks at home and pay attention to whether your feeling in your head goes away. Let's check back in 2 weeks to see what your blood pressure is.  We have other doses we can use of the new medication, bystolic.  Next appt:  06/20/2016   Jatziry Wechter L. Macklyn Glandon, D.O. [facility] [address], [place] [address] Cell Phone (Mon-Fri 8am-5pm):  [phone] On Call:  [phone] & follow prompts after 5pm & weekends Office Phone:  [phone] Office Fax:  [phone]

## 2016-06-06 NOTE — Patient Instructions (Signed)
Stop your cardura. Start the sample pills I gave you:  bystolic 5mg  daily each morning. Continue blood pressure checks at home and pay attention to whether your feeling in your head goes away. Let's check back in 2 weeks to see what your blood pressure is.  We have other doses we can use of the new medication, bystolic.

## 2016-06-08 LAB — URINE CULTURE

## 2016-06-09 ENCOUNTER — Other Ambulatory Visit: Payer: Self-pay | Admitting: *Deleted

## 2016-06-09 MED ORDER — CIPROFLOXACIN HCL 500 MG PO TABS: 500.0000 mg | ORAL_TABLET | Freq: Two times a day (BID) | ORAL | 0 refills | Status: DC

## 2016-06-20 ENCOUNTER — Other Ambulatory Visit: Payer: Self-pay | Admitting: Internal Medicine

## 2016-06-20 ENCOUNTER — Encounter: Payer: Self-pay | Admitting: Internal Medicine

## 2016-06-20 ENCOUNTER — Ambulatory Visit (INDEPENDENT_AMBULATORY_CARE_PROVIDER_SITE_OTHER): Payer: Medicare Other | Admitting: Internal Medicine

## 2016-06-20 VITALS — BP 120/88 | HR 54 | Temp 97.5°F | Ht 66.0 in | Wt 108.0 lb

## 2016-06-20 DIAGNOSIS — I471 Supraventricular tachycardia, unspecified: Secondary | ICD-10-CM | POA: Insufficient documentation

## 2016-06-20 DIAGNOSIS — N182 Chronic kidney disease, stage 2 (mild): Secondary | ICD-10-CM | POA: Diagnosis not present

## 2016-06-20 DIAGNOSIS — G3184 Mild cognitive impairment, so stated: Secondary | ICD-10-CM | POA: Diagnosis not present

## 2016-06-20 DIAGNOSIS — I10 Essential (primary) hypertension: Secondary | ICD-10-CM

## 2016-06-20 DIAGNOSIS — D692 Other nonthrombocytopenic purpura: Secondary | ICD-10-CM

## 2016-06-20 MED ORDER — NEBIVOLOL HCL 5 MG PO TABS: 5.0000 mg | ORAL_TABLET | Freq: Every day | ORAL | 3 refills | Status: DC

## 2016-06-20 NOTE — Progress Notes (Signed)
Location:  [facility] clinic Provider:  Veryl Abril L. Mariea Clonts, D.O., C.M.D.  Code Status: DNR Goals of Care:  Advanced Directives 06/20/2016  Does Patient Have a Medical Advance Directive? Yes  Type of Paramedic of [place];Living will  Does patient want to make changes to medical advance directive? -  Copy of [place] in Chart? Yes   Chief Complaint  Patient presents with  . Medical Management of Chronic Issues    2 week follow-up, review labs. Finished the Bystolic samples, not sure when. Review patients medication, she brought them in. Here with husband.    HPI: Patient is a 83 y.o. female seen today for medical management of chronic diseases.    BP well controlled today and has finished bystolic samples.  Has also a prn metoprolol 50mg  every 8 h as needed for palpitations.    Medication review:  Reviewed today and again removed her cardura which has anticholinergic side effects and can worsen orthostasis.  On bystolic 5mg  now and sent Rx to pharmacy.  Also completed abx for UTI so removed that bottle from the bag and gave to her husband.  Instead of namzaric, she's on namenda XR and aricept--she had an old aricept bottle that was also removed from the bag.  The three bottles of meds NOT to be taken were given to her husband to dispose of while those she is to continue were returned to her bag.    Completed her cipro.  She still gets a little burning.  Not going frequently. Suspect some atrophic vaginitis and inadequate fluid intake--again reviewed need for 6-8 glasses of water per day.  Still not feeling entirely clearheaded.  Not dizzy.  Unable to articulate the feeling--suspect she means her memory loss, but not clear  Memory not good per husband.  Due for mmse in November.    Continues to bruise easily on her arms w/o knowing how or why.  On baby asa and also has thin, aging skin.  I see nothing about COPD in her chart so not sure how it's in  diagnoses from the past.  Not on medications.  Past Medical History:  Diagnosis Date  . Acute bronchitis   . Benign neoplasm of uterus, part unspecified   . Chronic ischemic heart disease, unspecified   . Chronic kidney disease, stage II (mild)   . Cough   . Encounter for long-term (current) use of other medications   . Ganglion, unspecified   . Hearing loss   . Hyperlipidemia   . Hypertension   . Impacted cerumen   . Light-headed feeling 09/25/2015  . Macular degeneration (senile) of retina, unspecified   . Mitral valve disorders(424.0)   . Nonspecific abnormal electrocardiogram (ECG) (EKG)   . Nonspecific abnormal electrocardiogram (ECG) (EKG)   . Osteoporosis, unspecified   . Other abnormal blood chemistry   . Other emphysema ([place])   . Other malaise and fatigue   . Palpitations   . Senile cataract, unspecified   . Senile purpura ([place]) 06/06/2015  . Thrombocytopenia, unspecified ([place])   . Unspecified hearing loss     Past Surgical History:  Procedure Laterality Date  . KNEE ARTHROSCOPY     right    Allergies  Allergen Reactions  . Penicillins     Allergies as of 06/20/2016      Reactions   Penicillins       Medication List       Accurate as of 06/20/16 10:16 AM. Always use  your most recent med list.          acetaminophen 500 MG tablet Commonly known as:  TYLENOL Take 500 mg by mouth every 6 (six) hours as needed.   aspirin 81 MG tablet Take 81 mg by mouth. Take one tablet every other day   atorvastatin 10 MG tablet Commonly known as:  LIPITOR TAKE 1 TABLET ONCE DAILY TO LOWER CHOLESTEROL.   CALCIUM-VITAMIN D PO Take by mouth 2 (two) times daily.   [employer] SPECTRAVITE PO Take 1 tablet by mouth daily.   donepezil 10 MG tablet Commonly known as:  ARICEPT Take 1 tablet (10 mg total) by mouth at bedtime.   LIPOTRIAD VISION SUPPORT PO Take 1 tablet by mouth daily.   losartan 100 MG tablet Commonly known as:  COZAAR One daily to control BP     memantine 28 MG Cp24 24 hr capsule Commonly known as:  NAMENDA XR Take 1 capsule (28 mg total) by mouth daily.   metoprolol succinate 50 MG 24 hr tablet Commonly known as:  TOPROL-XL Take 50 mg by mouth. Take one tablet as needed every 8 hours for palpitations.   nebivolol 5 MG tablet Commonly known as:  BYSTOLIC Take 1 tablet (5 mg total) by mouth daily.       Review of Systems:  Review of Systems  Constitutional: Negative for chills and fever.  HENT: Positive for hearing loss. Negative for congestion.   Eyes: Negative for blurred vision.       Glasses  Respiratory: Negative for cough and shortness of breath.   Cardiovascular: Negative for chest pain and palpitations.  Gastrointestinal: Negative for abdominal pain, blood in stool, constipation, diarrhea and melena.  Genitourinary: Positive for dysuria. Negative for flank pain, frequency, hematuria and urgency.  Musculoskeletal: Negative for back pain, falls, joint pain and myalgias.  Skin: Negative for itching and rash.       Purple ecchymoses of arms  Neurological: Negative for dizziness and loss of consciousness.  Endo/Heme/Allergies: Bruises/bleeds easily.  Psychiatric/Behavioral: Positive for memory loss. Negative for depression. The patient is not nervous/anxious and does not have insomnia.     Health Maintenance  Topic Date Due  . PNA vac Low Risk Adult (2 of 2 - PCV13) 11/23/2013  . INFLUENZA VACCINE  08/20/2016  . MAMMOGRAM  11/22/2016  . TETANUS/TDAP  11/26/2022  . DEXA SCAN  Completed    Physical Exam: Vitals:   06/20/16 0950  BP: 120/88  Pulse: (!) 54  Temp: 97.5 F (36.4 C)  TempSrc: Oral  SpO2: 97%  Weight: 108 lb (49 kg)  Height: 5\' 6"  (1.676 m)   Body mass index is 17.43 kg/m. Physical Exam  Constitutional: She is oriented to person, place, and time. She appears well-developed and well-nourished. No distress.  HENT:  Head: Normocephalic and atraumatic.  Hearing aids  Eyes:  glasses   Cardiovascular: Normal rate, regular rhythm, normal heart sounds and intact distal pulses.   Pulmonary/Chest: Effort normal and breath sounds normal. No respiratory distress.  Abdominal: Soft. Bowel sounds are normal.  Musculoskeletal: Normal range of motion.  Walks w/o assistive device  Neurological: She is alert and oriented to person, place, and time. No cranial nerve deficit. Coordination normal.  Short term memory loss  Skin: Skin is warm and dry. Capillary refill takes less than 2 seconds.  Multiple small ecchymoses of arms, thin skin  Psychiatric: She has a normal mood and affect. Her behavior is normal.    Labs reviewed: Basic Metabolic  Panel:  Recent Labs  09/25/15 1612  01/27/16 0849 05/08/16 1728 06/02/16 0830  NA 140  < > 138 140 138  K 5.1  < > 4.6 4.3 4.7  CL 102  < > 104 100 105  CO2 24  < > 27 26 25   GLUCOSE 88  < > 99 101* 85  BUN 29*  < > 32* 26 27*  CREATININE 1.61*  < > 1.45* 1.54* 1.62*  CALCIUM 9.7  < > 9.4 8.9 9.1  TSH 2.02  --   --   --   --   < > = values in this interval not displayed. Liver Function Tests:  Recent Labs  12/07/15 0831 05/08/16 1728 06/02/16 0830  AST 13 30 15   ALT 10 28 14   ALKPHOS 74 80 88  BILITOT 0.7 0.5 0.7  PROT 6.8 6.5 6.3  ALBUMIN 3.6 3.9 3.7   No results for input(s): LIPASE, AMYLASE in the last 8760 hours. No results for input(s): AMMONIA in the last 8760 hours. CBC:  Recent Labs  09/25/15 1612 01/27/16 0849 05/08/16 1652  WBC 6.3 9.6 7.6  NEUTROABS 3,969 7.7  --   HGB 13.5 13.0 12.1*  HCT 40.3 40.9 36.0*  MCV 93.3 97.4 95.6  PLT 157 165  --    Lipid Panel:  Recent Labs  12/07/15 0831  CHOL 161  HDL 68  LDLCALC 79  TRIG 70  CHOLHDL 2.4   Lab Results  Component Value Date   HGBA1C 5.9 (H) 06/04/2015    Assessment/Plan 1. Essential hypertension - bp now at goal since taking bystolic so cont this, also hopefully will prevent need for metoprolol prn for palpitations - nebivolol (BYSTOLIC)  5 MG tablet; Take 1 tablet (5 mg total) by mouth daily.  Dispense: 90 tablet; Refill: 3  2. Mild cognitive impairment with memory loss -is gradually progressing--having more difficulty with medication mgt--recommended involving her husband or daughter in checking when she fills her pillbox -recheck mmse in Nov as planned annually  3. Paroxysmal SVT (supraventricular tachycardia) (HCC) -no recent episodes, keep prn metoprolol available and husband and daughter are aware what it's for as pt only remembers for a few mins at a time  4. Chronic kidney disease, stage II (mild) -cont to monitor and push po fluids--again reiterated 6-8 8oz glasses of water per day--she is trying  5. Senile purpura (HCC) -ongoing, still present due to asa and loss of elastin and collagen over years  Labs/tests ordered:  No orders of the defined types were placed in this encounter. will order day of next visit  Next appt:  10/09/2016  Shanesha Bednarz L. Hardeep Reetz, D.O. [facility] [address], [place] [address] Cell Phone (Mon-Fri 8am-5pm):  [phone] On Call:  [phone] & follow prompts after 5pm & weekends Office Phone:  [phone] Office Fax:  [phone]

## 2016-06-24 ENCOUNTER — Telehealth: Payer: Self-pay | Admitting: *Deleted

## 2016-06-24 NOTE — Telephone Encounter (Signed)
Received Prior Authorization for Bystolic 5mg . Initiated through [facility].  Key#: [id number] Case ID#: [id number] APPROVED 06/24/16-06/24/2017

## 2016-06-25 ENCOUNTER — Telehealth: Payer: Self-pay | Admitting: *Deleted

## 2016-06-25 NOTE — Telephone Encounter (Signed)
Patient notified to take both medications to control her blood pressure per Dr. Cyndi Lennert last OV note dated 06/20/16. Patient agreed.

## 2016-06-25 NOTE — Telephone Encounter (Signed)
Patient called and left message on clinical intake and stated that she was confused regarding a medication that was called into [facility]. Patient stated that she was not aware of a medication called Bystolic. Stated She was not aware that she was suppose to be taking this with already taking Losartan.   I reviewed Dr. Cyndi Lennert last [place] note on 06/20/16 and it states that patient's BP is now at goal since taking Bystolic samples and to continue taking the medication as prescribed. Dr. Mariea Clonts had faxed Rx to pharmacy.   Tried calling patient back and LMOM to return call.

## 2016-08-05 ENCOUNTER — Ambulatory Visit (INDEPENDENT_AMBULATORY_CARE_PROVIDER_SITE_OTHER): Payer: Medicare Other | Admitting: Nurse Practitioner

## 2016-08-05 ENCOUNTER — Encounter: Payer: Self-pay | Admitting: Nurse Practitioner

## 2016-08-05 VITALS — BP 122/72 | HR 63 | Temp 97.5°F | Resp 17 | Ht 66.0 in | Wt 103.4 lb

## 2016-08-05 DIAGNOSIS — R350 Frequency of micturition: Secondary | ICD-10-CM

## 2016-08-05 LAB — POCT URINALYSIS DIPSTICK
Bilirubin, UA: NEGATIVE
Blood, UA: NEGATIVE
Glucose, UA: NEGATIVE
Ketones, UA: NEGATIVE
LEUKOCYTES UA: NEGATIVE
NITRITE UA: NEGATIVE
PH UA: 7.5 (ref 5.0–8.0)
PROTEIN UA: NEGATIVE
Spec Grav, UA: 1.01 (ref 1.010–1.025)
UROBILINOGEN UA: NEGATIVE U/dL — AB

## 2016-08-05 NOTE — Progress Notes (Signed)
Careteam: Patient Care Team: Gayland Curry, DO as PCP - General (Geriatric Medicine) Minus Breeding, MD as Consulting Physician (Cardiology) Stark Klein, MD as Consulting Physician (General Surgery) Netta Cedars, MD as Consulting Physician (Orthopedic Surgery) Richmond Campbell, MD as Consulting Physician (Gastroenterology) Nat Christen, MD as Attending Physician (Optometry) Hortencia Pilar, MD as Consulting Physician (Surgery)  Advanced Directive information Does Patient Have a Medical Advance Directive?: Yes, Type of Advance Directive: [place];Living will  Allergies  Allergen Reactions  . Penicillins     Chief Complaint  Patient presents with  . Acute Visit    Pt is being seen due to urinary frequency and buring x several days.   . Other    Husband in room      HPI: Patient is a 83 y.o. female seen in the office today due to dysuria.  Has recent UTI and does not know if she really got over it.  Has been feeling burning when she urinates and just a mild nagging feeling that started this morning.  Getting up 1 time at night to urinate (which is normal) Reports more frequent urination for several days. Started noticing discomfort a few days ago but increased yesterday.  No blood in urine.  Urine always clear, not cloudy.  Last UTI in May. Felt like symptoms improved since then and now back. Took all of Cipro which was prescribed then.   Review of Systems:  Review of Systems  Constitutional: Negative for chills and fever.  HENT: Positive for hearing loss.   Eyes: Negative for blurred vision.       Glasses  Respiratory: Negative for shortness of breath.   Cardiovascular: Negative for chest pain and palpitations.  Gastrointestinal: Negative for abdominal pain, blood in stool, constipation, diarrhea, melena, nausea and vomiting.  Genitourinary: Positive for dysuria and frequency. Negative for flank pain, hematuria and urgency.    Musculoskeletal: Negative for joint pain.  Skin: Negative for itching and rash.  Neurological: Negative for dizziness.    Past Medical History:  Diagnosis Date  . Acute bronchitis   . Benign neoplasm of uterus, part unspecified   . Chronic ischemic heart disease, unspecified   . Chronic kidney disease, stage II (mild)   . Cough   . Encounter for long-term (current) use of other medications   . Ganglion, unspecified   . Hearing loss   . Hyperlipidemia   . Hypertension   . Impacted cerumen   . Light-headed feeling 09/25/2015  . Macular degeneration (senile) of retina, unspecified   . Mitral valve disorders(424.0)   . Nonspecific abnormal electrocardiogram (ECG) (EKG)   . Nonspecific abnormal electrocardiogram (ECG) (EKG)   . Osteoporosis, unspecified   . Other abnormal blood chemistry   . Other emphysema ([place])   . Other malaise and fatigue   . Palpitations   . Senile cataract, unspecified   . Senile purpura ([place]) 06/06/2015  . Thrombocytopenia, unspecified ([place])   . Unspecified hearing loss    Past Surgical History:  Procedure Laterality Date  . KNEE ARTHROSCOPY     right   Social History:   reports that she has never smoked. She has never used smokeless tobacco. She reports that she does not drink alcohol or use drugs.  Family History  Problem Relation Age of Onset  . Stroke Brother   . Cancer Sister        lung  . Parkinsonism Father        Alzheimer's  Medications: Patient's Medications  New Prescriptions   No medications on file  Previous Medications   ACETAMINOPHEN (TYLENOL) 500 MG TABLET    Take 500 mg by mouth every 6 (six) hours as needed.   ASPIRIN 81 MG TABLET    Take 81 mg by mouth. Take one tablet every other day   ATORVASTATIN (LIPITOR) 10 MG TABLET    TAKE 1 TABLET ONCE DAILY TO LOWER CHOLESTEROL.   CALCIUM-VITAMIN D PO    Take by mouth 2 (two) times daily.     DONEPEZIL (ARICEPT) 10 MG TABLET    Take 1 tablet (10 mg total) by mouth at bedtime.    LOSARTAN (COZAAR) 100 MG TABLET    One daily to control BP   MEMANTINE (NAMENDA XR) 28 MG CP24 24 HR CAPSULE    Take 1 capsule (28 mg total) by mouth daily.   METOPROLOL SUCCINATE (TOPROL-XL) 50 MG 24 HR TABLET    Take 50 mg by mouth. Take one tablet as needed every 8 hours for palpitations.   MULTIPLE VITAMINS-MINERALS ([employer] SPECTRAVITE PO)    Take 1 tablet by mouth daily.   NEBIVOLOL (BYSTOLIC) 5 MG TABLET    Take 1 tablet (5 mg total) by mouth daily.   SPECIALTY VITAMINS PRODUCTS (LIPOTRIAD VISION SUPPORT PO)    Take 1 tablet by mouth daily.  Modified Medications   No medications on file  Discontinued Medications   No medications on file     Physical Exam:  Vitals:   08/05/16 1114  BP: 122/72  Pulse: 63  Resp: 17  Temp: (!) 97.5 F (36.4 C)  TempSrc: Oral  SpO2: 98%  Weight: 103 lb 6.4 oz (46.9 kg)  Height: 5\' 6"  (1.676 m)   Body mass index is 16.69 kg/m.  Physical Exam  Constitutional: She is oriented to person, place, and time. She appears well-developed and well-nourished. No distress.  HENT:  Head: Normocephalic and atraumatic.  Hearing aids  Eyes:  glasses  Cardiovascular: Normal rate, regular rhythm and normal heart sounds.   Pulmonary/Chest: Effort normal and breath sounds normal. No respiratory distress.  Abdominal: Soft. Bowel sounds are normal. She exhibits no distension. There is no tenderness.  Genitourinary: Vagina normal. There is no rash or lesion on the right labia. There is no rash or lesion on the left labia. No vaginal discharge found.  Musculoskeletal: Normal range of motion.  Neurological: She is alert and oriented to person, place, and time. No cranial nerve deficit. Coordination normal.  Short term memory loss  Skin: Skin is warm and dry. Capillary refill takes less than 2 seconds.  Multiple small ecchymoses of arms, thin skin  Psychiatric: She has a normal mood and affect. Her behavior is normal.    Labs reviewed: Basic Metabolic  Panel:  Recent Labs  09/25/15 1612  01/27/16 0849 05/08/16 1728 06/02/16 0830  NA 140  < > 138 140 138  K 5.1  < > 4.6 4.3 4.7  CL 102  < > 104 100 105  CO2 24  < > 27 26 25   GLUCOSE 88  < > 99 101* 85  BUN 29*  < > 32* 26 27*  CREATININE 1.61*  < > 1.45* 1.54* 1.62*  CALCIUM 9.7  < > 9.4 8.9 9.1  TSH 2.02  --   --   --   --   < > = values in this interval not displayed. Liver Function Tests:  Recent Labs  12/07/15 0831 05/08/16 1728 06/02/16 0830  AST 13 30 15   ALT 10 28 14   ALKPHOS 74 80 88  BILITOT 0.7 0.5 0.7  PROT 6.8 6.5 6.3  ALBUMIN 3.6 3.9 3.7   No results for input(s): LIPASE, AMYLASE in the last 8760 hours. No results for input(s): AMMONIA in the last 8760 hours. CBC:  Recent Labs  09/25/15 1612 01/27/16 0849 05/08/16 1652  WBC 6.3 9.6 7.6  NEUTROABS 3,969 7.7  --   HGB 13.5 13.0 12.1*  HCT 40.3 40.9 36.0*  MCV 93.3 97.4 95.6  PLT 157 165  --    Lipid Panel:  Recent Labs  12/07/15 0831  CHOL 161  HDL 68  LDLCALC 79  TRIG 70  CHOLHDL 2.4   TSH:  Recent Labs  09/25/15 1612  TSH 2.02   A1C: Lab Results  Component Value Date   HGBA1C 5.9 (H) 06/04/2015     Assessment/Plan 1. Urinary frequency -worse in the last week with dysuria  -vaginal exam unremarkable  - POCT urinalysis dipstick normal however she does have symptoms of increase frequency with burning.  - Culture, Urine ordered -consider estrace vaginal cream if culture negative.      Carlos American. Harle Battiest  [facility] [phone] 8 am - 5 pm) [phone] (after hours)

## 2016-08-07 ENCOUNTER — Other Ambulatory Visit: Payer: Self-pay | Admitting: Nurse Practitioner

## 2016-08-07 LAB — URINE CULTURE: ORGANISM ID, BACTERIA: NO GROWTH

## 2016-08-07 MED ORDER — ESTRADIOL 0.1 MG/GM VA CREA: 1.0000 | TOPICAL_CREAM | VAGINAL | 2 refills | Status: DC

## 2016-08-14 ENCOUNTER — Telehealth: Payer: Self-pay | Admitting: *Deleted

## 2016-08-14 NOTE — Telephone Encounter (Signed)
LM with man for patient to return call.

## 2016-08-14 NOTE — Telephone Encounter (Signed)
It is fine to take the memantine and donepezil together.  In fact, namzaric is a combination pill of the two medications.

## 2016-08-14 NOTE — Telephone Encounter (Signed)
Patient called and wanted to know if she could take her Donepezil and Memantine at the same time. Stated that she has been but got worried she was doing it wrong and just wants to confirm it is ok. Please Advise.

## 2016-08-15 NOTE — Telephone Encounter (Signed)
Patient notified and agreed.  

## 2016-09-01 ENCOUNTER — Ambulatory Visit (INDEPENDENT_AMBULATORY_CARE_PROVIDER_SITE_OTHER): Payer: Medicare Other | Admitting: Nurse Practitioner

## 2016-09-01 ENCOUNTER — Encounter: Payer: Self-pay | Admitting: Nurse Practitioner

## 2016-09-01 VITALS — BP 112/78 | HR 61 | Temp 97.6°F | Resp 17 | Ht 66.0 in | Wt 102.6 lb

## 2016-09-01 DIAGNOSIS — R300 Dysuria: Secondary | ICD-10-CM

## 2016-09-01 DIAGNOSIS — N182 Chronic kidney disease, stage 2 (mild): Secondary | ICD-10-CM | POA: Diagnosis not present

## 2016-09-01 LAB — CBC WITH DIFFERENTIAL/PLATELET
BASOS PCT: 1 %
Basophils Absolute: 65 cells/uL (ref 0–200)
EOS ABS: 65 {cells}/uL (ref 15–500)
Eosinophils Relative: 1 %
HCT: 42.8 % (ref 35.0–45.0)
Hemoglobin: 14.1 g/dL (ref 11.7–15.5)
LYMPHS ABS: 1105 {cells}/uL (ref 850–3900)
Lymphocytes Relative: 17 %
MCH: 31.8 pg (ref 27.0–33.0)
MCHC: 32.9 g/dL (ref 32.0–36.0)
MCV: 96.4 fL (ref 80.0–100.0)
MONO ABS: 455 {cells}/uL (ref 200–950)
MONOS PCT: 7 %
MPV: 11.2 fL (ref 7.5–12.5)
NEUTROS ABS: 4810 {cells}/uL (ref 1500–7800)
Neutrophils Relative %: 74 %
PLATELETS: 156 10*3/uL (ref 140–400)
RBC: 4.44 MIL/uL (ref 3.80–5.10)
RDW: 13.4 % (ref 11.0–15.0)
WBC: 6.5 10*3/uL (ref 3.8–10.8)

## 2016-09-01 LAB — BASIC METABOLIC PANEL WITH GFR
BUN: 28 mg/dL — ABNORMAL HIGH (ref 7–25)
CALCIUM: 9.8 mg/dL (ref 8.6–10.4)
CO2: 26 mmol/L (ref 20–32)
Chloride: 106 mmol/L (ref 98–110)
Creat: 1.79 mg/dL — ABNORMAL HIGH (ref 0.60–0.88)
GFR, EST AFRICAN AMERICAN: 30 mL/min — AB (ref >=60)
GFR, EST NON AFRICAN AMERICAN: 26 mL/min — AB (ref >=60)
Glucose, Bld: 83 mg/dL (ref 65–99)
POTASSIUM: 5 mmol/L (ref 3.5–5.3)
Sodium: 139 mmol/L (ref 135–146)

## 2016-09-01 MED ORDER — ESTRADIOL 0.1 MG/GM VA CREA: 1.0000 | TOPICAL_CREAM | VAGINAL | 2 refills | Status: DC

## 2016-09-01 NOTE — Patient Instructions (Addendum)
Make sure you are drinking 6-8 eight oz glasses of water a day Will get labs today Cont on estrace cream Keep follow up with Dr Mariea Clonts as scheduled.

## 2016-09-01 NOTE — Progress Notes (Signed)
Careteam: Patient Care Team: Gayland Curry, DO as PCP - General (Geriatric Medicine) Minus Breeding, MD as Consulting Physician (Cardiology) Stark Klein, MD as Consulting Physician (General Surgery) Netta Cedars, MD as Consulting Physician (Orthopedic Surgery) Richmond Campbell, MD as Consulting Physician (Gastroenterology) Nat Christen, MD as Attending Physician (Optometry) Hortencia Pilar, MD as Consulting Physician (Surgery)  Advanced Directive information Does Patient Have a Medical Advance Directive?: Yes, Type of Advance Directive: [place];Living will  Allergies  Allergen Reactions  . Penicillins     Chief Complaint  Patient presents with  . Acute Visit    Pt is being seen to follow up on vaginal burning. Pt reports she still has some burning but she finished the Estrace cream a few days ago.   . Other    Husband in room     HPI: Patient is a 83 y.o. female seen in the office today to follow up burning. Pt reports increasing burning while urinating. UA C&S was negative therefore she was placed on Estrace. She took 2 weeks but did not notice a difference- has stopped now but did not really notice much difference.  States she is still having a nagging feeling. Reports she is not "suffering" and not having pain.  Gradually has notice she is weaker/energy as she used to which has gone down over time. Sleep has not been a great as she would like. Still has a good appetite. Eats 3 meals a day- enjoys food.  Gets done her household chores and runs errands.   Review of Systems:  Review of Systems  Constitutional: Negative for chills and fever.  HENT: Positive for hearing loss.   Eyes: Negative for blurred vision.       Glasses  Respiratory: Negative for shortness of breath.   Cardiovascular: Negative for chest pain and palpitations.  Gastrointestinal: Negative for abdominal pain, constipation, diarrhea, nausea and vomiting.    Genitourinary: Positive for dysuria and frequency. Negative for flank pain, hematuria and urgency.  Skin: Negative for itching and rash.  Neurological: Positive for weakness (generalized). Negative for dizziness.    Past Medical History:  Diagnosis Date  . Acute bronchitis   . Benign neoplasm of uterus, part unspecified   . Chronic ischemic heart disease, unspecified   . Chronic kidney disease, stage II (mild)   . Cough   . Encounter for long-term (current) use of other medications   . Fall 01/30/2016  . Ganglion, unspecified   . Hearing loss   . Hyperlipidemia   . Hypertension   . Impacted cerumen   . Light-headed feeling 09/25/2015  . Macular degeneration (senile) of retina, unspecified   . Mitral valve disorders(424.0)   . Nonspecific abnormal electrocardiogram (ECG) (EKG)   . Nonspecific abnormal electrocardiogram (ECG) (EKG)   . Osteoporosis, unspecified   . Other abnormal blood chemistry   . Other emphysema ([place])   . Other malaise and fatigue   . Palpitations   . Senile cataract, unspecified   . Senile purpura ([place]) 06/06/2015  . Thrombocytopenia, unspecified ([place])   . Unspecified hearing loss    Past Surgical History:  Procedure Laterality Date  . KNEE ARTHROSCOPY     right   Social History:   reports that she has never smoked. She has never used smokeless tobacco. She reports that she does not drink alcohol or use drugs.  Family History  Problem Relation Age of Onset  . Stroke Brother   . Cancer Sister  lung  . Parkinsonism Father        Alzheimer's    Medications: Patient's Medications  New Prescriptions   No medications on file  Previous Medications   ACETAMINOPHEN (TYLENOL) 500 MG TABLET    Take 500 mg by mouth every 6 (six) hours as needed.   ASPIRIN 81 MG TABLET    Take 81 mg by mouth. Take one tablet every other day   ATORVASTATIN (LIPITOR) 10 MG TABLET    TAKE 1 TABLET ONCE DAILY TO LOWER CHOLESTEROL.   CALCIUM-VITAMIN D PO    Take by  mouth 2 (two) times daily.     DONEPEZIL (ARICEPT) 10 MG TABLET    Take 1 tablet (10 mg total) by mouth at bedtime.   ESTRADIOL (ESTRACE VAGINAL) 0.1 MG/GM VAGINAL CREAM    Place 1 Applicatorful vaginally 3 (three) times a week.   LOSARTAN (COZAAR) 100 MG TABLET    One daily to control BP   MEMANTINE (NAMENDA XR) 28 MG CP24 24 HR CAPSULE    Take 1 capsule (28 mg total) by mouth daily.   METOPROLOL SUCCINATE (TOPROL-XL) 50 MG 24 HR TABLET    Take 50 mg by mouth. Take one tablet as needed every 8 hours for palpitations.   MULTIPLE VITAMINS-MINERALS ([employer] SPECTRAVITE PO)    Take 1 tablet by mouth daily.   NEBIVOLOL (BYSTOLIC) 5 MG TABLET    Take 1 tablet (5 mg total) by mouth daily.   SPECIALTY VITAMINS PRODUCTS (LIPOTRIAD VISION SUPPORT PO)    Take 1 tablet by mouth daily.  Modified Medications   No medications on file  Discontinued Medications   No medications on file     Physical Exam:  Vitals:   09/01/16 0941  BP: 112/78  Pulse: 61  Resp: 17  Temp: 97.6 F (36.4 C)  TempSrc: Oral  SpO2: 96%  Weight: 102 lb 9.6 oz (46.5 kg)  Height: _0  (1.676 m)   Body mass index is 16.56 kg/m.  Physical Exam  Constitutional: She is oriented to person, place, and time. She appears well-developed and well-nourished. No distress.  HENT:  Head: Normocephalic and atraumatic.  Hearing aids  Eyes:  glasses  Cardiovascular: Normal rate, regular rhythm and normal heart sounds.   Pulmonary/Chest: Effort normal and breath sounds normal. No respiratory distress.  Abdominal: Soft. Bowel sounds are normal. She exhibits no distension. There is no tenderness.  Genitourinary: There is no rash or lesion on the right labia. There is no rash or lesion on the left labia. No vaginal discharge found.  Neurological: She is alert and oriented to person, place, and time. No cranial nerve deficit. Coordination normal.  Short term memory loss  Skin: Skin is warm and dry. Capillary refill takes less than 2  seconds.  Multiple small ecchymoses of arms, thin skin  Psychiatric: She has a normal mood and affect. Her behavior is normal.   Labs reviewed: Basic Metabolic Panel:  Recent Labs  09/25/15 1612  01/27/16 0849 05/08/16 1728 06/02/16 0830  NA 140  < > 138 140 138  K 5.1  < > 4.6 4.3 4.7  CL 102  < > 104 100 105  CO2 24  < > _1 GLUCOSE 88  < > 99 101* 85  BUN 29*  < > 32* 26 27*  CREATININE 1.61*  < > 1.45* 1.54* 1.62*  CALCIUM 9.7  < > 9.4 8.9 9.1  TSH 2.02  --   --   --   --   < > =  values in this interval not displayed. Liver Function Tests:  Recent Labs  12/07/15 0831 05/08/16 1728 06/02/16 0830  AST _0 ALT _1 ALKPHOS 74 80 88  BILITOT 0.7 0.5 0.7  PROT 6.8 6.5 6.3  ALBUMIN 3.6 3.9 3.7   No results for input(s): LIPASE, AMYLASE in the last 8760 hours. No results for input(s): AMMONIA in the last 8760 hours. CBC:  Recent Labs  09/25/15 1612 01/27/16 0849 05/08/16 1652  WBC 6.3 9.6 7.6  NEUTROABS 3,969 7.7  --   HGB 13.5 13.0 12.1*  HCT 40.3 40.9 36.0*  MCV 93.3 97.4 95.6  PLT 157 165  --    Lipid Panel:  Recent Labs  12/07/15 0831  CHOL 161  HDL 68  LDLCALC 79  TRIG 70  CHOLHDL 2.4   TSH:  Recent Labs  09/25/15 1612  TSH 2.02   A1C: Lab Results  Component Value Date   HGBA1C 5.9 (H) 06/04/2015   MMSE - Mini Mental State Exam 12/11/2015 06/06/2015 12/06/2014  Orientation to time _2 Orientation to Place _3 Registration _4 Attention/ Calculation _5 Recall _6 Language- name 2 objects _7 Language- repeat _8 Language- follow 3 step command _9 Language- read & follow direction _10 Write a sentence _11 Copy design 0 1 1  Total score _12 Assessment/Plan 1. Dysuria -ongoing, UA C&S negative on last check -encouraged to cont estrace at this time and to see Dr Mariea Clonts as scheduled next month - estradiol (ESTRACE VAGINAL) 0.1 MG/GM vaginal cream; Place 1 Applicatorful  vaginally 3 (three) times a week.  Dispense: 42.5 g; Refill: 2  2. Chronic kidney disease, stage II (mild) -encouraged proper hydration.  -will follow up labs, mild anemia noted on last lab which may be due to progressive renal disease.  - CBC with Differential/Platelets - BMP with eGFR  Next appt: as scheduled with Dr Sharee Holster K. Harle Battiest  [facility] [phone] 8 am - 5 pm) [phone] (after hours)

## 2016-09-02 ENCOUNTER — Telehealth: Payer: Self-pay | Admitting: *Deleted

## 2016-09-02 ENCOUNTER — Other Ambulatory Visit: Payer: Self-pay | Admitting: Nurse Practitioner

## 2016-09-02 DIAGNOSIS — I10 Essential (primary) hypertension: Secondary | ICD-10-CM

## 2016-09-02 DIAGNOSIS — N184 Chronic kidney disease, stage 4 (severe): Secondary | ICD-10-CM

## 2016-09-02 DIAGNOSIS — N289 Disorder of kidney and ureter, unspecified: Secondary | ICD-10-CM

## 2016-09-02 MED ORDER — LOSARTAN POTASSIUM 50 MG PO TABS
ORAL_TABLET | ORAL | Status: DC
Start: 2016-09-02 — End: 2016-09-03

## 2016-09-02 NOTE — Telephone Encounter (Signed)
Patient called regarding message she received concerning her labwork. Stated that she was confused because she has never taken Losartan. Lab Message sent back to [place] regarding this. Nephrologist referral placed per [place] comments in [place]. (Referral has already been placed.)

## 2016-09-03 ENCOUNTER — Other Ambulatory Visit: Payer: Self-pay | Admitting: Nurse Practitioner

## 2016-09-03 NOTE — Addendum Note (Signed)
Addended by: Denyse Amass on: 09/03/2016 04:07 PM   Modules accepted: Orders

## 2016-09-14 ENCOUNTER — Emergency Department (HOSPITAL_COMMUNITY)
Admission: EM | Admit: 2016-09-14 | Discharge: 2016-09-14 | Disposition: A | Discharge status: Home / Self Care | Payer: Medicare Other | Attending: Emergency Medicine | Admitting: Emergency Medicine

## 2016-09-14 ENCOUNTER — Encounter (HOSPITAL_COMMUNITY): Payer: Self-pay | Admitting: Nurse Practitioner

## 2016-09-14 DIAGNOSIS — N182 Chronic kidney disease, stage 2 (mild): Secondary | ICD-10-CM | POA: Insufficient documentation

## 2016-09-14 DIAGNOSIS — Z79899 Other long term (current) drug therapy: Secondary | ICD-10-CM | POA: Diagnosis not present

## 2016-09-14 DIAGNOSIS — Z7982 Long term (current) use of aspirin: Secondary | ICD-10-CM | POA: Insufficient documentation

## 2016-09-14 DIAGNOSIS — R2232 Localized swelling, mass and lump, left upper limb: Secondary | ICD-10-CM | POA: Insufficient documentation

## 2016-09-14 DIAGNOSIS — I129 Hypertensive chronic kidney disease with stage 1 through stage 4 chronic kidney disease, or unspecified chronic kidney disease: Secondary | ICD-10-CM | POA: Diagnosis not present

## 2016-09-14 DIAGNOSIS — R300 Dysuria: Secondary | ICD-10-CM | POA: Diagnosis not present

## 2016-09-14 DIAGNOSIS — I259 Chronic ischemic heart disease, unspecified: Secondary | ICD-10-CM | POA: Diagnosis not present

## 2016-09-14 DIAGNOSIS — M7989 Other specified soft tissue disorders: Secondary | ICD-10-CM

## 2016-09-14 LAB — URINALYSIS, ROUTINE W REFLEX MICROSCOPIC
Bilirubin Urine: NEGATIVE
Glucose, UA: NEGATIVE mg/dL
HGB URINE DIPSTICK: NEGATIVE
KETONES UR: NEGATIVE mg/dL
LEUKOCYTES UA: NEGATIVE
Nitrite: NEGATIVE
PROTEIN: NEGATIVE mg/dL
Specific Gravity, Urine: 1.015 (ref 1.005–1.030)
pH: 6.5 (ref 5.0–8.0)

## 2016-09-14 LAB — BASIC METABOLIC PANEL
ANION GAP: 7 (ref 5–15)
BUN: 31 mg/dL — ABNORMAL HIGH (ref 6–20)
CALCIUM: 9.4 mg/dL (ref 8.9–10.3)
CO2: 24 mmol/L (ref 22–32)
Chloride: 107 mmol/L (ref 101–111)
Creatinine, Ser: 1.65 mg/dL — ABNORMAL HIGH (ref 0.44–1.00)
GFR calc Af Amer: 33 mL/min — ABNORMAL LOW (ref >60)
GFR calc non Af Amer: 28 mL/min — ABNORMAL LOW (ref >60)
GLUCOSE: 98 mg/dL (ref 65–99)
Potassium: 4.5 mmol/L (ref 3.5–5.1)
Sodium: 138 mmol/L (ref 135–145)

## 2016-09-14 LAB — CBC
HEMATOCRIT: 42.1 % (ref 36.0–46.0)
HEMOGLOBIN: 14 g/dL (ref 12.0–15.0)
MCH: 32.4 pg (ref 26.0–34.0)
MCHC: 33.3 g/dL (ref 30.0–36.0)
MCV: 97.5 fL (ref 78.0–100.0)
Platelets: 162 10*3/uL (ref 150–400)
RBC: 4.32 MIL/uL (ref 3.87–5.11)
RDW: 13.3 % (ref 11.5–15.5)
WBC: 6 10*3/uL (ref 4.0–10.5)

## 2016-09-14 LAB — PROTIME-INR
INR: 0.97
PROTHROMBIN TIME: 12.9 s (ref 11.4–15.2)

## 2016-09-14 NOTE — ED Provider Notes (Signed)
Pt seen and evaluated. D/W Arlean Hopping. Pt with spontaneous sub Q eccymosis or Lt Forearm. No thinners, NOAC, or ASA.   Forearm soft. Compartments soft, Hand with normal sensation, range of motion, strong pulses, good capillary refill. Place an Ace wrap for minimal compression. Restaurant remove her rings that she may have some swelling. Recheck with any numbness weakness tingling pain decreased range of motion or any symptoms suggestive of compartment syndrome. These were reviewed with patient.   Tanna Furry, MD 09/14/16 2244

## 2016-09-14 NOTE — ED Provider Notes (Signed)
[facility] DEPT Provider Note   CSN: [id number] Arrival date & time: 09/14/16  1629     History   Chief Complaint Chief Complaint  Patient presents with  . Arm Swelling    Left forearm  . Dysuria    HPI Leeona Mccardle Critzer is a 83 y.o. female.  HPI   Jenesis Martin Stough is a 83 y.o. female, with a history of stage II CKD, HTN, and senile purpura, presenting to the ED with left forearm swelling that she noticed upon waking this morning. She states she has never had this issue before. Denies anticoagulation. States she initially had swelling without bruising. Patient also endorses some urinary frequency and dysuria. States she is already under the care of her PCP for this issue.   Patient denies falls/trauma, pain in the forearm, numbness, tingling, weakness, chest pain, shortness of breath, or any other complaints.        Past Medical History:  Diagnosis Date  . Acute bronchitis   . Benign neoplasm of uterus, part unspecified   . Chronic ischemic heart disease, unspecified   . Chronic kidney disease, stage II (mild)   . Cough   . Encounter for long-term (current) use of other medications   . Fall 01/30/2016  . Ganglion, unspecified   . Hearing loss   . Hyperlipidemia   . Hypertension   . Impacted cerumen   . Light-headed feeling 09/25/2015  . Macular degeneration (senile) of retina, unspecified   . Mitral valve disorders(424.0)   . Nonspecific abnormal electrocardiogram (ECG) (EKG)   . Nonspecific abnormal electrocardiogram (ECG) (EKG)   . Osteoporosis, unspecified   . Other abnormal blood chemistry   . Other emphysema ([place])   . Other malaise and fatigue   . Palpitations   . Senile cataract, unspecified   . Senile purpura ([place]) 06/06/2015  . Thrombocytopenia, unspecified ([place])   . Unspecified hearing loss     Patient Active Problem List   Diagnosis Date Noted  . Mild cognitive impairment with memory loss 06/20/2016  . Paroxysmal SVT  (supraventricular tachycardia) ([place]) 06/20/2016  . Fracture, sacrum/coccyx ([place]) 01/30/2016  . Syncope 01/30/2016  . Light-headed feeling 09/25/2015  . Senile purpura ([place]) 06/06/2015  . Palpitations 05/30/2014  . Hyperglycemia 11/23/2012  . Chronic kidney disease, stage II (mild) 05/18/2012  . Thrombocytopenia, unspecified ([place]) 05/18/2012  . Mitral valve disorder 05/18/2012  . Senile cataract, unspecified 05/18/2012  . Abnormal EKG 04/15/2011  . HTN (hypertension) 04/15/2011  . Hyperlipidemia     Past Surgical History:  Procedure Laterality Date  . KNEE ARTHROSCOPY     right    OB History    No data available       Home Medications    Prior to Admission medications   Medication Sig Start Date End Date Taking? Authorizing Provider  acetaminophen (TYLENOL) 500 MG tablet Take 500 mg by mouth every 6 (six) hours as needed.    [provider]  aspirin 81 MG tablet Take 81 mg by mouth. Take one tablet every other day    [provider]  atorvastatin (LIPITOR) 10 MG tablet TAKE 1 TABLET ONCE DAILY TO LOWER CHOLESTEROL. 02/25/16   Estill Dooms, MD  CALCIUM-VITAMIN D PO Take by mouth 2 (two) times daily.      [provider]  donepezil (ARICEPT) 10 MG tablet Take 1 tablet (10 mg total) by mouth at bedtime. 05/20/16   Estill Dooms, MD  estradiol (ESTRACE) 0.1 MG/GM vaginal cream Place 2  g vaginally at bedtime.    [provider]  memantine (NAMENDA XR) 28 MG CP24 24 hr capsule Take 1 capsule (28 mg total) by mouth daily. 05/20/16   Estill Dooms, MD  metoprolol succinate (TOPROL-XL) 50 MG 24 hr tablet Take 50 mg by mouth. Take one tablet as needed every 8 hours for palpitations.    [provider]  Multiple Vitamins-Minerals ([employer] SPECTRAVITE PO) Take 1 tablet by mouth daily.    [provider]  nebivolol (BYSTOLIC) 5 MG tablet Take 1 tablet (5 mg total) by mouth daily. 06/20/16   Gayland Curry, DO  Specialty Vitamins Products  (LIPOTRIAD VISION SUPPORT PO) Take 1 tablet by mouth daily.    [provider]    Family History Family History  Problem Relation Age of Onset  . Stroke Brother   . Cancer Sister        lung  . Parkinsonism Father        Alzheimer's    Social History Social History  Substance Use Topics  . Smoking status: Never Smoker  . Smokeless tobacco: Never Used  . Alcohol use No     Allergies   Penicillins   Review of Systems Review of Systems  Constitutional: Negative for chills and fever.  Respiratory: Negative for shortness of breath.   Cardiovascular: Negative for chest pain.  Gastrointestinal: Negative for abdominal pain, diarrhea, nausea and vomiting.  Musculoskeletal:       Left forearm swelling and bruising  Skin: Negative for wound.  Neurological: Negative for weakness and numbness.  All other systems reviewed and are negative.    Physical Exam Updated Vital Signs BP (!) 181/95 (BP Location: Left Arm) Comment: pt takes bp meds in the morning  Pulse (!) 56   Temp 98.1 F (36.7 C) (Oral)   Resp 16   SpO2 96%   Physical Exam  Constitutional: She appears well-developed and well-nourished. No distress.  HENT:  Head: Normocephalic and atraumatic.  Eyes: Conjunctivae are normal.  Neck: Neck supple.  Cardiovascular: Normal rate, regular rhythm, normal heart sounds and intact distal pulses.   Excellent, equal pulses in the upper extremities bilaterally.  Pulmonary/Chest: Effort normal and breath sounds normal. No respiratory distress.  Abdominal: Soft. There is no tenderness. There is no guarding.  Musculoskeletal: She exhibits edema.  Swelling and ecchymosis to the ulnar and posterior surfaces of the left forearm. Full range of motion without difficulty or pain in the left elbow, wrist, and shoulder.  Compartments are soft and there is no pain out of proportion.   Lymphadenopathy:    She has no cervical adenopathy.  Neurological: She is alert.  No  sensory deficits in the bilateral upper extremities. Strength 5/5 in the cardinal directions of the left wrist, elbow, and shoulder.  Skin: Skin is warm and dry. Capillary refill takes less than 2 seconds. She is not diaphoretic.  Psychiatric: She has a normal mood and affect. Her behavior is normal.  Nursing note and vitals reviewed.            ED Treatments / Results  Labs (all labs ordered are listed, but only abnormal results are displayed) Labs Reviewed  BASIC METABOLIC PANEL - Abnormal; Notable for the following:       Result Value   BUN 31 (*)    Creatinine, Ser 1.65 (*)    GFR calc non Af Amer 28 (*)    GFR calc Af Amer 33 (*)    All other  components within normal limits  CBC  PROTIME-INR  URINALYSIS, ROUTINE W REFLEX MICROSCOPIC   BUN  Date Value Ref Range Status  09/14/2016 31 (H) 6 - 20 mg/dL Final  09/01/2016 28 (H) 7 - 25 mg/dL Final  06/02/2016 27 (H) 7 - 25 mg/dL Final  05/08/2016 26 8 - 27 mg/dL Final  01/27/2016 32 (H) 6 - 20 mg/dL Final  06/04/2015 32 (H) 8 - 27 mg/dL Final  12/01/2014 11 8 - 27 mg/dL Final  11/28/2013 27 8 - 27 mg/dL Final   Creat  Date Value Ref Range Status  09/01/2016 1.79 (H) 0.60 - 0.88 mg/dL Final    Comment:      For patients > or = 81 years of age: The upper reference limit for Creatinine is approximately 13% higher for people identified as African-American.     06/02/2016 1.62 (H) 0.60 - 0.88 mg/dL Final    Comment:      For patients > or = 81 years of age: The upper reference limit for Creatinine is approximately 13% higher for people identified as African-American.     12/07/2015 1.41 (H) 0.60 - 0.88 mg/dL Final    Comment:      For patients > or = 81 years of age: The upper reference limit for Creatinine is approximately 13% higher for people identified as African-American.     09/25/2015 1.61 (H) 0.60 - 0.88 mg/dL Final    Comment:      For patients > or = 81 years of age: The upper reference limit  for Creatinine is approximately 13% higher for people identified as African-American.      Creatinine, Ser  Date Value Ref Range Status  09/14/2016 1.65 (H) 0.44 - 1.00 mg/dL Final  05/08/2016 1.54 (H) 0.57 - 1.00 mg/dL Final  01/27/2016 1.45 (H) 0.44 - 1.00 mg/dL Final     EKG  EKG Interpretation None       Radiology No results found.  Procedures Procedures (including critical care time)  Medications Ordered in ED Medications - No data to display   Initial Impression / Assessment and Plan / ED Course  I have reviewed the triage vital signs and the nursing notes.  Pertinent labs & imaging results that were available during my care of the patient were reviewed by me and considered in my medical decision making (see chart for details).     Patient presents with left forearm swelling. She has no neurologic deficits. Her pulses are strong and equal. Good capillary refill. PT/INR normal. Platelets normal. No traumatic origin, therefore I do not think imaging would be high yield in this patient. PCP follow-up. The patient was given instructions for home care as well as return precautions. Patient voices understanding of these instructions, accepts the plan, and is comfortable with discharge.  Findings and plan of care discussed with Tanna Furry, MD. Dr. Jeneen Rinks personally evaluated and examined this patient.  Vitals:   09/14/16 1639 09/14/16 1959 09/14/16 2223  BP: (!) 179/98 (!) 181/95 (!) 159/97  Pulse: (!) 54 (!) 56 (!) 57  Resp: 18 16 16   Temp: 98.1 F (36.7 C)    TempSrc: Oral    SpO2: 95% 96% 94%   Hypertension noted. Patient has no symptoms of hypertensive emergency.   Final Clinical Impressions(s) / ED Diagnoses   Final diagnoses:  Left arm swelling    New Prescriptions New Prescriptions   No medications on file     Layla Maw 09/14/16 2304  Layla Maw 09/14/16 2306    Tanna Furry, MD 09/23/16 [phone]

## 2016-09-14 NOTE — ED Triage Notes (Signed)
Pt presents with left forearm swelling proximal to the elbow, that she reports a sudden onset this a.m. Denies pain, temp change but states she "bruces easily" and chart review indicates a hx of senile purpura. Denies being on any anticoagulants. Also c/o urinary frequency and dysuria despite finish the course of abx she prescribed by her PCP for a UTI.

## 2016-09-14 NOTE — Discharge Instructions (Signed)
May keep the Ace wrap in place to apply some very mild compression. Remove this wrap should you begin to experience numbness, tingling, weakness, increased pain, or any other additional abnormalities. Return to the ED should you begin to experience any of the preceding abnormalities as well. Follow up with your primary care provider on this matter.

## 2016-09-16 ENCOUNTER — Telehealth: Payer: Self-pay | Admitting: *Deleted

## 2016-09-16 MED ORDER — DONEPEZIL HCL 10 MG PO TABS: 10.0000 mg | ORAL_TABLET | Freq: Every day | ORAL | 5 refills | Status: DC

## 2016-09-16 NOTE — Telephone Encounter (Signed)
Patient daughter, Patrici Ranks called and we reviewed patient's medication list to make sure it was correct. Patient needs Rx for Aricept faxed to pharmacy. Faxed.  Appointment scheduled for tomorrow with Janett Billow to follow up patient's Left Arm Swelling.

## 2016-09-17 ENCOUNTER — Ambulatory Visit (INDEPENDENT_AMBULATORY_CARE_PROVIDER_SITE_OTHER): Payer: Medicare Other | Admitting: Nurse Practitioner

## 2016-09-17 ENCOUNTER — Encounter: Payer: Self-pay | Admitting: Nurse Practitioner

## 2016-09-17 ENCOUNTER — Ambulatory Visit: Payer: Self-pay | Admitting: Nurse Practitioner

## 2016-09-17 VITALS — BP 132/84 | HR 61 | Temp 98.4°F | Resp 17 | Ht 66.0 in | Wt 104.6 lb

## 2016-09-17 DIAGNOSIS — R413 Other amnesia: Secondary | ICD-10-CM

## 2016-09-17 DIAGNOSIS — I10 Essential (primary) hypertension: Secondary | ICD-10-CM | POA: Diagnosis not present

## 2016-09-17 DIAGNOSIS — N184 Chronic kidney disease, stage 4 (severe): Secondary | ICD-10-CM

## 2016-09-17 DIAGNOSIS — M25422 Effusion, left elbow: Secondary | ICD-10-CM

## 2016-09-17 NOTE — Progress Notes (Signed)
Careteam: Patient Care Team: Gayland Curry, DO as PCP - General (Geriatric Medicine) Minus Breeding, MD as Consulting Physician (Cardiology) Stark Klein, MD as Consulting Physician (General Surgery) Netta Cedars, MD as Consulting Physician (Orthopedic Surgery) Richmond Campbell, MD as Consulting Physician (Gastroenterology) Nat Christen, MD as Attending Physician (Optometry) Hortencia Pilar, MD as Consulting Physician (Surgery)  Advanced Directive information Does Patient Have a Medical Advance Directive?: Yes, Type of Advance Directive: [place];Living will  Allergies  Allergen Reactions  . Penicillins     Chief Complaint  Patient presents with  . Acute Visit    Pt is being seen due to left forearm redness/brusing and swelling x 4 days. No known injury.      HPI: Patient is a 83 y.o. female seen in the office today to follow up ED visit. Pt woke up with left arm redness and swelling. Denies trauma/falls stated it happened when she woke up. Not painful. Went to ED for evaluation who recommended ace wrap for compression.  Noticed swelling in arm 3 days ago in the am and as the day went on bruising was noted. As they sat in the waiting room bruising and swelling became worse. Bruising now darker colored without worsening and swelling improved overall.  Still no pain. Good ROM.   Medication question from daughter who is now taking over her medications.  Not drinking enough water a day.  Had been taking doxazosin.  Over the weekend blood pressure was high- ~140s/90s, better since then.   Has not been taking aricept Does a pill box and aricept was in there.    Review of Systems:  Review of Systems  Constitutional: Negative for chills and fever.  HENT: Positive for hearing loss.   Eyes: Negative for blurred vision.       Glasses  Respiratory: Negative for shortness of breath.   Cardiovascular: Negative for chest pain and palpitations.    Gastrointestinal: Negative for abdominal pain, constipation, diarrhea, nausea and vomiting.  Musculoskeletal: Negative for falls, joint pain and myalgias.       Swelling and bruising noted to left arm specifically left elbow but no pain, good ROM and strength.   Skin: Negative for itching and rash.  Neurological: Positive for weakness (generalized). Negative for dizziness and headaches.  Psychiatric/Behavioral: Positive for memory loss.    Past Medical History:  Diagnosis Date  . Acute bronchitis   . Benign neoplasm of uterus, part unspecified   . Chronic ischemic heart disease, unspecified   . Chronic kidney disease, stage II (mild)   . Cough   . Encounter for long-term (current) use of other medications   . Fall 01/30/2016  . Ganglion, unspecified   . Hearing loss   . Hyperlipidemia   . Hypertension   . Impacted cerumen   . Light-headed feeling 09/25/2015  . Macular degeneration (senile) of retina, unspecified   . Mitral valve disorders(424.0)   . Nonspecific abnormal electrocardiogram (ECG) (EKG)   . Nonspecific abnormal electrocardiogram (ECG) (EKG)   . Osteoporosis, unspecified   . Other abnormal blood chemistry   . Other emphysema ([place])   . Other malaise and fatigue   . Palpitations   . Senile cataract, unspecified   . Senile purpura ([place]) 06/06/2015  . Thrombocytopenia, unspecified ([place])   . Unspecified hearing loss    Past Surgical History:  Procedure Laterality Date  . KNEE ARTHROSCOPY     right   Social History:   reports that she  has never smoked. She has never used smokeless tobacco. She reports that she does not drink alcohol or use drugs.  Family History  Problem Relation Age of Onset  . Stroke Brother   . Cancer Sister        lung  . Parkinsonism Father        Alzheimer's    Medications: Patient's Medications  New Prescriptions   No medications on file  Previous Medications   ACETAMINOPHEN (TYLENOL) 500 MG TABLET    Take 500 mg by mouth every 6  (six) hours as needed.   ASPIRIN 81 MG TABLET    Take 81 mg by mouth. Take one tablet every other day   ATORVASTATIN (LIPITOR) 10 MG TABLET    TAKE 1 TABLET ONCE DAILY TO LOWER CHOLESTEROL.   CALCIUM-VITAMIN D PO    Take by mouth 2 (two) times daily.     DONEPEZIL (ARICEPT) 10 MG TABLET    Take 1 tablet (10 mg total) by mouth at bedtime.   ESTRADIOL (ESTRACE) 0.1 MG/GM VAGINAL CREAM    Place 2 g vaginally at bedtime.   LOSARTAN (COZAAR) 100 MG TABLET    Take 50 mg by mouth daily.   MEMANTINE (NAMENDA XR) 28 MG CP24 24 HR CAPSULE    Take 1 capsule (28 mg total) by mouth daily.   METOPROLOL SUCCINATE (TOPROL-XL) 50 MG 24 HR TABLET    Take 50 mg by mouth. Take one tablet as needed every 8 hours for palpitations.   MULTIPLE VITAMINS-MINERALS ([employer] SPECTRAVITE PO)    Take 1 tablet by mouth daily.   NEBIVOLOL (BYSTOLIC) 5 MG TABLET    Take 1 tablet (5 mg total) by mouth daily.   SPECIALTY VITAMINS PRODUCTS (LIPOTRIAD VISION SUPPORT PO)    Take 1 tablet by mouth daily.  Modified Medications   No medications on file  Discontinued Medications   No medications on file     Physical Exam:  Vitals:   09/17/16 1448  BP: 132/84  Pulse: 61  Resp: 17  Temp: 98.4 F (36.9 C)  TempSrc: Oral  SpO2: 97%  Weight: 104 lb 9.6 oz (47.4 kg)  Height: 5\' 6"  (1.676 m)   Body mass index is 16.88 kg/m.  Physical Exam  Constitutional: She is oriented to person, place, and time. She appears well-developed and well-nourished. No distress.  HENT:  Head: Normocephalic and atraumatic.  Hearing aids  Eyes:  glasses  Cardiovascular: Normal rate, regular rhythm and normal heart sounds.   Pulmonary/Chest: Effort normal and breath sounds normal. No respiratory distress.  Abdominal: Soft. Bowel sounds are normal. She exhibits no distension. There is no tenderness.  Genitourinary: There is no rash or lesion on the right labia. There is no rash or lesion on the left labia. No vaginal discharge found.    Musculoskeletal: Normal range of motion. She exhibits edema.  Significant swelling and bruising noted to left elbow. This has improved based on pictures from pts daughter and husbands phone. bruising more pronounced and diffuse. Slight tenderness noted distally to elbow. No drainage or heat noted  Neurological: She is alert and oriented to person, place, and time. No cranial nerve deficit. Coordination normal.  Short term memory loss  Skin: Skin is warm and dry. Capillary refill takes less than 2 seconds.  Multiple small ecchymoses of arms, thin skin  Psychiatric: She has a normal mood and affect. Her behavior is normal.    Labs reviewed: Basic Metabolic Panel:  Recent Labs  09/25/15 1612  06/02/16 0830 09/01/16 1009 09/14/16 1657  NA 140  < > 138 139 138  K 5.1  < > 4.7 5.0 4.5  CL 102  < > 105 106 107  CO2 24  < > 25 26 24   GLUCOSE 88  < > 85 83 98  BUN 29*  < > 27* 28* 31*  CREATININE 1.61*  < > 1.62* 1.79* 1.65*  CALCIUM 9.7  < > 9.1 9.8 9.4  TSH 2.02  --   --   --   --   < > = values in this interval not displayed. Liver Function Tests:  Recent Labs  12/07/15 0831 05/08/16 1728 06/02/16 0830  AST 13 30 15   ALT 10 28 14   ALKPHOS 74 80 88  BILITOT 0.7 0.5 0.7  PROT 6.8 6.5 6.3  ALBUMIN 3.6 3.9 3.7   No results for input(s): LIPASE, AMYLASE in the last 8760 hours. No results for input(s): AMMONIA in the last 8760 hours. CBC:  Recent Labs  09/25/15 1612 01/27/16 0849 05/08/16 1652 09/01/16 1009 09/14/16 1657  WBC 6.3 9.6 7.6 6.5 6.0  NEUTROABS 3,969 7.7  --  4,810  --   HGB 13.5 13.0 12.1* 14.1 14.0  HCT 40.3 40.9 36.0* 42.8 42.1  MCV 93.3 97.4 95.6 96.4 97.5  PLT 157 165  --  156 162   Lipid Panel:  Recent Labs  12/07/15 0831  CHOL 161  HDL 68  LDLCALC 79  TRIG 70  CHOLHDL 2.4   TSH:  Recent Labs  09/25/15 1612  TSH 2.02   A1C: Lab Results  Component Value Date   HGBA1C 5.9 (H) 06/04/2015   mini-mental status exam MMSE - Mini  Mental State Exam 12/11/2015 06/06/2015 12/06/2014  Orientation to time 5 5 5   Orientation to Place 5 5 5   Registration 3 3 3   Attention/ Calculation 4 5 5   Recall 1 1 1   Language- name 2 objects 2 2 2   Language- repeat 1 1 1   Language- follow 3 step command 3 3 3   Language- read & follow direction 1 1 1   Write a sentence 1 1 1   Copy design 0 1 1  Total score 26 28 28      Assessment/Plan 1. Swelling of left elbow Pt can not remember injury/fall but elbow with edema and significant bruising to left arm. Educated on bruising process. No pain, full ROM and strength. Cont to use ace for slight compression.  Strict monitoring and return precautions given.   2. Essential hypertension Stable, cont losartan 50 mg by mouth daily and bystolic 5 mg daily   3. CKD (chronic kidney disease) stage 4, GFR 15-29 ml/min [facility]) Pending nephrology referral. Discussed with daughter. Encourage proper hydration and to avoid NSAIDS (Aleve, Advil, Motrin, Ibuprofen)   4. Memory loss -continue aricept and namenda.  -daughter taking over medication at this time  Next appt: as scheduled with Dr Sharee Holster K. Harle Battiest  [facility] [phone] 8 am - 5 pm) [phone] (after hours)

## 2016-09-29 ENCOUNTER — Other Ambulatory Visit: Payer: Self-pay | Admitting: Internal Medicine

## 2016-09-30 ENCOUNTER — Ambulatory Visit (INDEPENDENT_AMBULATORY_CARE_PROVIDER_SITE_OTHER): Payer: Medicare Other | Admitting: Family Medicine

## 2016-09-30 ENCOUNTER — Encounter: Payer: Self-pay | Admitting: Family Medicine

## 2016-09-30 ENCOUNTER — Ambulatory Visit (INDEPENDENT_AMBULATORY_CARE_PROVIDER_SITE_OTHER): Payer: Medicare Other

## 2016-09-30 VITALS — BP 130/80 | HR 51 | Temp 97.4°F | Resp 16 | Ht 65.75 in | Wt 102.0 lb

## 2016-09-30 DIAGNOSIS — M25422 Effusion, left elbow: Secondary | ICD-10-CM | POA: Diagnosis not present

## 2016-09-30 DIAGNOSIS — N182 Chronic kidney disease, stage 2 (mild): Secondary | ICD-10-CM | POA: Diagnosis not present

## 2016-09-30 DIAGNOSIS — G3184 Mild cognitive impairment, so stated: Secondary | ICD-10-CM

## 2016-09-30 DIAGNOSIS — D692 Other nonthrombocytopenic purpura: Secondary | ICD-10-CM

## 2016-09-30 DIAGNOSIS — S5002XD Contusion of left elbow, subsequent encounter: Secondary | ICD-10-CM

## 2016-09-30 NOTE — Progress Notes (Signed)
Subjective:    Patient ID: Lindsey Salazar, female    DOB: February 27, 1932, 83 y.o.   MRN: [id number]  09/30/2016  Arm Pain (left are pain and swelling x 2 weeks )   HPI This 83 y.o. female presents for evaluation of L elbow swelling onset two weeks ago.  No trauma or fall.  No pain.  Husband reports that swelling is much improved from onset and that bruising is much improved from onset.  No numbness/tingling/weakness of LEFT arm.  Full function of L arm.  R hand dominant.  Admits to frequent falls but denies recent falls.   Upon review of chart, patient presented to ED on 09/14/16 for one day history of LEFT elbow swelling. Picture of L elbow present during ED visit on 09/14/16.  CBC, PT/INR, BMET all normal at that visit.   ACE wrap for compression recommended.  Then follow-up with PCP on 09/17/16 for LEFT elbow swelling.  Bruising had worsened yet swelling had improved at visit with NP [place].  Husband and daughter present for visit.   No other sites of bleeding; denies epistaxis, hematuria, bloody stools, gums bleeding.   BP Readings from Last 3 Encounters:  09/30/16 130/80  09/17/16 132/84  09/14/16 (!) 146/90   Wt Readings from Last 3 Encounters:  09/30/16 102 lb (46.3 kg)  09/17/16 104 lb 9.6 oz (47.4 kg)  09/01/16 102 lb 9.6 oz (46.5 kg)   Immunization History  Administered Date(s) Administered  . Influenza,inj,Quad PF,6+ Mos 11/23/2012, 11/30/2013, 12/06/2014  . Influenza-Unspecified 10/05/2009, 10/25/2010, 10/13/2011, 11/13/2015  . Pneumococcal Polysaccharide-23 11/23/2012  . Pneumococcal-Unspecified 01/21/1995  . Td 08/20/1992  . Tdap 11/25/2012  . Zoster 02/11/2006    Review of Systems  Constitutional: Negative for chills, diaphoresis, fatigue and fever.  HENT: Negative for ear pain, postnasal drip, rhinorrhea, sinus pressure, sore throat and trouble swallowing.   Respiratory: Negative for cough and shortness of breath.   Cardiovascular: Negative for chest  pain, palpitations and leg swelling.  Gastrointestinal: Negative for abdominal pain, constipation, diarrhea, nausea and vomiting.  Musculoskeletal: Positive for joint swelling. Negative for arthralgias.  Hematological: Bruises/bleeds easily.    Past Medical History:  Diagnosis Date  . Acute bronchitis   . Benign neoplasm of uterus, part unspecified   . Chronic ischemic heart disease, unspecified   . Chronic kidney disease, stage II (mild)   . Cough   . Encounter for long-term (current) use of other medications   . Fall 01/30/2016  . Ganglion, unspecified   . Hearing loss   . Hyperlipidemia   . Hypertension   . Impacted cerumen   . Light-headed feeling 09/25/2015  . Macular degeneration (senile) of retina, unspecified   . Mitral valve disorders(424.0)   . Nonspecific abnormal electrocardiogram (ECG) (EKG)   . Nonspecific abnormal electrocardiogram (ECG) (EKG)   . Osteoporosis, unspecified   . Other abnormal blood chemistry   . Other emphysema ([place])   . Other malaise and fatigue   . Palpitations   . Senile cataract, unspecified   . Senile purpura ([place]) 06/06/2015  . Thrombocytopenia, unspecified ([place])   . Unspecified hearing loss    Past Surgical History:  Procedure Laterality Date  . KNEE ARTHROSCOPY     right   Allergies  Allergen Reactions  . Penicillins     Social History   Social History  . Marital status: Single    Spouse name: N/A  . Number of children: 4  . Years of education: N/A   Occupational History  .  Retired   Social History Main Topics  . Smoking status: Never Smoker  . Smokeless tobacco: Never Used  . Alcohol use No  . Drug use: No  . Sexual activity: Not Currently   Other Topics Concern  . Not on file   Social History Narrative   Lives with husband.     Family History  Problem Relation Age of Onset  . Stroke Brother   . Cancer Sister        lung  . Parkinsonism Father        Alzheimer's       Objective:    BP 130/80   Pulse  (!) 51   Temp (!) 97.4 F (36.3 C) (Oral)   Resp 16   Ht 5' 5.75" (1.67 m)   Wt 102 lb (46.3 kg)   SpO2 96%   BMI 16.59 kg/m  Physical Exam  Constitutional: She is oriented to person, place, and time. She appears well-developed and well-nourished. No distress.  HENT:  Head: Normocephalic and atraumatic.  Eyes: Pupils are equal, round, and reactive to light. Conjunctivae are normal.  Neck: Normal range of motion. Neck supple.  Cardiovascular: Normal rate, regular rhythm and normal heart sounds.  Exam reveals no gallop and no friction rub.   No murmur heard. Capillary refill < 3 seconds LUE; radial pulse 2+ and strong.  Pulmonary/Chest: Effort normal and breath sounds normal. She has no wheezes. She has no rales.  Musculoskeletal:       Left wrist: Normal. She exhibits normal range of motion, no tenderness and no bony tenderness.       Left forearm: She exhibits swelling. She exhibits no tenderness, no bony tenderness, no edema, no deformity and no laceration.       Arms:      Left hand: Normal.  Moderate swelling along lateral aspect of forearm distal to lateral epicondyle.  +fluctuance along proximal lateral forearm; non-tender; no warmth.  No lateral or medial epicondyle TTP.  Full extension and flexion of L elbow; normal supination pronation of LUE.  Full ROM L elbow without pain or restriction; grip 5/5 L hand.  Neurological: She is alert and oriented to person, place, and time.  Skin: She is not diaphoretic.  +faint ecchymoses along L proximal forearm. Scattered ecchymoses B forearms throughout.  Psychiatric: She has a normal mood and affect. Her behavior is normal.  Nursing note and vitals reviewed.  Results for orders placed or performed during the hospital encounter of 41/66/06  Basic metabolic panel  Result Value Ref Range   Sodium 138 135 - 145 mmol/L   Potassium 4.5 3.5 - 5.1 mmol/L   Chloride 107 101 - 111 mmol/L   CO2 24 22 - 32 mmol/L   Glucose, Bld 98 65 - 99  mg/dL   BUN 31 (H) 6 - 20 mg/dL   Creatinine, Ser 1.65 (H) 0.44 - 1.00 mg/dL   Calcium 9.4 8.9 - 10.3 mg/dL   GFR calc non Af Amer 28 (L) >60 mL/min   GFR calc Af Amer 33 (L) >60 mL/min   Anion gap 7 5 - 15  CBC  Result Value Ref Range   WBC 6.0 4.0 - 10.5 K/uL   RBC 4.32 3.87 - 5.11 MIL/uL   Hemoglobin 14.0 12.0 - 15.0 g/dL   HCT 42.1 36.0 - 46.0 %   MCV 97.5 78.0 - 100.0 fL   MCH 32.4 26.0 - 34.0 pg   MCHC 33.3 30.0 - 36.0 g/dL  RDW 13.3 11.5 - 15.5 %   Platelets 162 150 - 400 K/uL  Protime-INR- (order if Patient is taking Coumadin / Warfarin)  Result Value Ref Range   Prothrombin Time 12.9 11.4 - 15.2 seconds   INR 0.97   Urinalysis, Routine w reflex microscopic- may I&O cath if menses  Result Value Ref Range   Color, Urine YELLOW YELLOW   APPearance CLEAR CLEAR   Specific Gravity, Urine 1.015 1.005 - 1.030   pH 6.5 5.0 - 8.0   Glucose, UA NEGATIVE NEGATIVE mg/dL   Hgb urine dipstick NEGATIVE NEGATIVE   Bilirubin Urine NEGATIVE NEGATIVE   Ketones, ur NEGATIVE NEGATIVE mg/dL   Protein, ur NEGATIVE NEGATIVE mg/dL   Nitrite NEGATIVE NEGATIVE   Leukocytes, UA NEGATIVE NEGATIVE   No results found. Depression screen [facility] 2/9 09/30/2016 06/06/2016 05/08/2016 05/08/2016 02/14/2016  Decreased Interest 0 0 0 0 0  Down, Depressed, Hopeless 0 0 0 0 0  PHQ - 2 Score 0 0 0 0 0   Fall Risk  09/30/2016 09/17/2016 09/01/2016 08/05/2016 06/06/2016  Falls in the past year? No No No No No  Number falls in past yr: - - - - -  Injury with Fall? - - - - -  Comment - - - - -  Risk Factor Category  - - - - -    Dg Elbow Complete Left (3+view)  Result Date: 09/30/2016 CLINICAL DATA:  All elbow swelling with no known trauma or other injury. Diffuse ecchymosis. EXAM: LEFT ELBOW - COMPLETE 3+ VIEW COMPARISON:  None in PACs FINDINGS: The bones are subjectively adequately mineralized. There is no acute or healing fracture. There is no lytic or blastic lesion. No definite joint effusion is observed.  There is some soft tissue fullness over the proximal forearm dorsally. IMPRESSION: No acute bony abnormality is observed. I cannot exclude a hematoma or hemorrhagic bursitis involving the extensor surface of the elbow and proximal forearm. Electronically Signed   By: David  Martinique M.D.   On: 09/30/2016 14:52   MMSE - Mini Mental State Exam 12/11/2015 06/06/2015 12/06/2014 11/30/2013  Orientation to time 5 5 5 5   Orientation to Place 5 5 5 5   Registration 3 3 3 3   Attention/ Calculation 4 5 5 5   Recall 1 1 1  0  Language- name 2 objects 2 2 2 2   Language- repeat 1 1 1 1   Language- follow 3 step command 3 3 3 2   Language- read & follow direction 1 1 1 1   Write a sentence 1 1 1 1   Copy design 0 1 1 1   Total score 26 28 28 26         Assessment & Plan:   1. Elbow swelling, left   2. Contusion of left elbow, subsequent encounter   3. Mild cognitive impairment with memory loss   4. Senile purpura ([place])   5. Chronic kidney disease, stage II (mild)    -improving from onset with decreased swelling and decreased ecchymoses. -L elbow xray obtained and no evidence of fracture. -recommend ongoing supportive care with rest, stretching. -avoid NSAIDs with renal insufficiency.  Orders Placed This Encounter  Procedures  . DG ELBOW COMPLETE LEFT (3+VIEW)    Standing Status:   Future    Number of Occurrences:   1    Standing Expiration Date:   09/30/2017    Order Specific Question:   Reason for Exam (SYMPTOM  OR DIAGNOSIS REQUIRED)    Answer:   L elbow swelling lateral;  no trauma or injury; onset with diffuse ecchymoses.    Order Specific Question:   Preferred imaging location?    Answer:   External   No orders of the defined types were placed in this encounter.   No Follow-up on file.   Sadae Arrazola Elayne Guerin, M.D. Primary Care at [facility] [place] [address] [place], [place]  [address] [phone] phone [phone] fax

## 2016-09-30 NOTE — Patient Instructions (Addendum)
   Go next door for an xray of the elbow. I will call you this afternoon with reading of xray.  IF you received an x-ray today, you will receive an invoice from [facility]. Please contact [facility] at [phone] with questions or concerns regarding your invoice.   IF you received labwork today, you will receive an invoice from [facility]. Please contact [facility] at [phone] with questions or concerns regarding your invoice.   Our billing staff will not be able to assist you with questions regarding bills from these companies.  You will be contacted with the lab results as soon as they are available. The fastest way to get your results is to activate your My Chart account. Instructions are located on the last page of this paperwork. If you have not heard from [place] regarding the results in 2 weeks, please contact this office.

## 2016-10-01 ENCOUNTER — Telehealth: Payer: Self-pay | Admitting: Family Medicine

## 2016-10-01 NOTE — Telephone Encounter (Signed)
Pt daugher is calling stating that her mother has dementia and is having a very rough morning and she is waiting on a call back regarding the xray on her arm and the daughter is requesting that we call her at [phone] when the results come back

## 2016-10-02 NOTE — Telephone Encounter (Signed)
Please contact daughter at your earliest convenience

## 2016-10-06 NOTE — Telephone Encounter (Signed)
Spoke with patient on Friday, 9/14 and advised of negative elbow xray. Also left message on daughters's voicemail advising of negative elbow xray.

## 2016-10-09 ENCOUNTER — Encounter: Payer: Self-pay | Admitting: Internal Medicine

## 2016-10-09 ENCOUNTER — Ambulatory Visit (INDEPENDENT_AMBULATORY_CARE_PROVIDER_SITE_OTHER): Payer: Medicare Other | Admitting: Internal Medicine

## 2016-10-09 VITALS — BP 138/80 | HR 58 | Temp 98.1°F | Wt 104.0 lb

## 2016-10-09 DIAGNOSIS — I10 Essential (primary) hypertension: Secondary | ICD-10-CM | POA: Diagnosis not present

## 2016-10-09 DIAGNOSIS — M25422 Effusion, left elbow: Secondary | ICD-10-CM | POA: Diagnosis not present

## 2016-10-09 DIAGNOSIS — Z23 Encounter for immunization: Secondary | ICD-10-CM | POA: Diagnosis not present

## 2016-10-09 DIAGNOSIS — N183 Chronic kidney disease, stage 3 unspecified: Secondary | ICD-10-CM

## 2016-10-09 DIAGNOSIS — D692 Other nonthrombocytopenic purpura: Secondary | ICD-10-CM

## 2016-10-09 DIAGNOSIS — G301 Alzheimer's disease with late onset: Secondary | ICD-10-CM

## 2016-10-09 DIAGNOSIS — N184 Chronic kidney disease, stage 4 (severe): Secondary | ICD-10-CM | POA: Insufficient documentation

## 2016-10-09 DIAGNOSIS — F0280 Dementia in other diseases classified elsewhere without behavioral disturbance: Secondary | ICD-10-CM

## 2016-10-09 NOTE — Progress Notes (Signed)
Location:  [facility] clinic Provider:  Calbert Hulsebus L. Mariea Clonts, D.O., C.M.D.  Code Status: to be determined  Goals of Care:  Advanced Directives 09/17/2016  Does Patient Have a Medical Advance Directive? Yes  Type of Paramedic of [place];Living will  Does patient want to make changes to medical advance directive? -  Copy of [place] in Chart? -  They do not want her suffering with a prolonged poor quality of life, but are not certain about immediate resuscitation status at this point.  They believe in God and believe their final destination is not here.    Chief Complaint  Patient presents with  . Medical Management of Chronic Issues    74mth follow-up    HPI: Patient is a 83 y.o. female seen today for medical management of chronic diseases.    Memory problems continue to progress.  On aricept and namenda XR.  Her daughter thinks she may have stopped the aricept by accident.  Combination was not covered.  They have a list now of when to take what.  She is managing daily life with her routine.  She does not recall how to get to places so her husband has been going with her everywhere.   He drives also.  She has not actually gotten lost.  She is only driving to the grocery store.  She stopped driving to [place] to play the piano. Shawnie Pons, a daughter of pt's husband, helps to prepare some food on weekends.  She still cooks a little, but not much major cooking.  All old pills have been removed from the home.  She has cut back on her responsibilities.  They do mobile meals once a quarter (husband and wife together).    Left elbow still has a knot, but xray was negative.  Had been swollen elbow to wrist.  It was twice the size of the right forearm.  Labs were normal and no other testing was done.  She carried her purse on it.  She was not having pain.  It was just tingling or tight and dark purple.  CKD3:  Was progressing so she was referred to [place]  [facility] and referral coordinator just checked on the status (left message for them).  Her daughter got her a special water bottle and she's not using it routinely--must drink her water.  Weight loss:  Both she and her husband have not been drinking ensure as recommended.   They are eating three meals a day.    Denies any pain.  Can't remember when she last took tylenol.  Might take at night to sleep.    Agrees to flu shot.    Past Medical History:  Diagnosis Date  . Acute bronchitis   . Benign neoplasm of uterus, part unspecified   . Chronic ischemic heart disease, unspecified   . Chronic kidney disease, stage II (mild)   . Cough   . Encounter for long-term (current) use of other medications   . Fall 01/30/2016  . Ganglion, unspecified   . Hearing loss   . Hyperlipidemia   . Hypertension   . Impacted cerumen   . Light-headed feeling 09/25/2015  . Macular degeneration (senile) of retina, unspecified   . Mitral valve disorders(424.0)   . Nonspecific abnormal electrocardiogram (ECG) (EKG)   . Nonspecific abnormal electrocardiogram (ECG) (EKG)   . Osteoporosis, unspecified   . Other abnormal blood chemistry   . Other emphysema ([place])   . Other malaise and  fatigue   . Palpitations   . Senile cataract, unspecified   . Senile purpura ([place]) 06/06/2015  . Thrombocytopenia, unspecified ([place])   . Unspecified hearing loss     Past Surgical History:  Procedure Laterality Date  . KNEE ARTHROSCOPY     right    Allergies  Allergen Reactions  . Penicillins     Outpatient Encounter Prescriptions as of 10/09/2016  Medication Sig  . acetaminophen (TYLENOL) 500 MG tablet Take 500 mg by mouth every 6 (six) hours as needed.  Marland Kitchen aspirin 81 MG tablet Take 81 mg by mouth. Take one tablet every other day  . atorvastatin (LIPITOR) 10 MG tablet TAKE 1 TABLET ONCE DAILY TO LOWER CHOLESTEROL.  Marland Kitchen CALCIUM-VITAMIN D PO Take by mouth 2 (two) times daily.    Marland Kitchen donepezil (ARICEPT) 10 MG tablet Take 1  tablet (10 mg total) by mouth at bedtime.  Marland Kitchen estradiol (ESTRACE) 0.1 MG/GM vaginal cream Place 2 g vaginally at bedtime.  Marland Kitchen losartan (COZAAR) 100 MG tablet Take 50 mg by mouth daily.  . memantine (NAMENDA XR) 28 MG CP24 24 hr capsule Take 1 capsule (28 mg total) by mouth daily.  . metoprolol succinate (TOPROL-XL) 50 MG 24 hr tablet Take 50 mg by mouth. Take one tablet as needed every 8 hours for palpitations.  . Multiple Vitamins-Minerals ([employer] SPECTRAVITE PO) Take 1 tablet by mouth daily.  . nebivolol (BYSTOLIC) 5 MG tablet Take 1 tablet (5 mg total) by mouth daily.  Marland Kitchen Specialty Vitamins Products (LIPOTRIAD VISION SUPPORT PO) Take 1 tablet by mouth daily.   No facility-administered encounter medications on file as of 10/09/2016.     Review of Systems:  Review of Systems  Constitutional: Negative for chills and fever.  HENT: Positive for hearing loss.   Eyes: Negative for blurred vision.       Glasses  Respiratory: Negative for cough and shortness of breath.   Cardiovascular: Negative for chest pain, palpitations and leg swelling.  Gastrointestinal: Negative for abdominal pain.  Genitourinary: Negative for dysuria.  Musculoskeletal: Negative for falls.  Skin: Positive for rash.  Neurological: Negative for dizziness and loss of consciousness.  Endo/Heme/Allergies: Bruises/bleeds easily.  Psychiatric/Behavioral: Positive for memory loss. Negative for depression. The patient is not nervous/anxious and does not have insomnia.     Health Maintenance  Topic Date Due  . PNA vac Low Risk Adult (2 of 2 - PCV13) 11/23/2013  . INFLUENZA VACCINE  08/20/2016  . MAMMOGRAM  11/22/2016  . TETANUS/TDAP  11/26/2022  . DEXA SCAN  Completed    Physical Exam: Vitals:   10/09/16 0956  BP: 138/80  Pulse: (!) 58  Temp: 98.1 F (36.7 C)  TempSrc: Oral  SpO2: 96%  Weight: 104 lb (47.2 kg)   Body mass index is 16.91 kg/m. Physical Exam  Constitutional: She is oriented to person, place, and  time.  Tall thin white female  HENT:  Head: Normocephalic and atraumatic.  Eyes: Pupils are equal, round, and reactive to light.  glasses  Neck: Normal range of motion. Neck supple.  Cardiovascular: Normal rate, regular rhythm, normal heart sounds and intact distal pulses.   Pulmonary/Chest: Effort normal and breath sounds normal. No respiratory distress.  Abdominal: Bowel sounds are normal.  Musculoskeletal: Normal range of motion. She exhibits no tenderness.  Neurological: She is alert and oriented to person, place, and time.  Short term memory loss  Skin: Skin is warm and dry. Capillary refill takes less than 2 seconds.  Purple discoloration  and increased hemosiderin of left forearm  Psychiatric: She has a normal mood and affect.    Labs reviewed: Basic Metabolic Panel:  Recent Labs  06/02/16 0830 09/01/16 1009 09/14/16 1657  NA 138 139 138  K 4.7 5.0 4.5  CL 105 106 107  CO2 25 26 24   GLUCOSE 85 83 98  BUN 27* 28* 31*  CREATININE 1.62* 1.79* 1.65*  CALCIUM 9.1 9.8 9.4   Liver Function Tests:  Recent Labs  12/07/15 0831 05/08/16 1728 06/02/16 0830  AST 13 30 15   ALT 10 28 14   ALKPHOS 74 80 88  BILITOT 0.7 0.5 0.7  PROT 6.8 6.5 6.3  ALBUMIN 3.6 3.9 3.7   No results for input(s): LIPASE, AMYLASE in the last 8760 hours. No results for input(s): AMMONIA in the last 8760 hours. CBC:  Recent Labs  01/27/16 0849 05/08/16 1652 09/01/16 1009 09/14/16 1657  WBC 9.6 7.6 6.5 6.0  NEUTROABS 7.7  --  4,810  --   HGB 13.0 12.1* 14.1 14.0  HCT 40.9 36.0* 42.8 42.1  MCV 97.4 95.6 96.4 97.5  PLT 165  --  156 162   Lipid Panel:  Recent Labs  12/07/15 0831  CHOL 161  HDL 68  LDLCALC 79  TRIG 70  CHOLHDL 2.4   Lab Results  Component Value Date   HGBA1C 5.9 (H) 06/04/2015    Procedures since last visit: Dg Elbow Complete Left (3+view)  Result Date: 09/30/2016 CLINICAL DATA:  All elbow swelling with no known trauma or other injury. Diffuse ecchymosis.  EXAM: LEFT ELBOW - COMPLETE 3+ VIEW COMPARISON:  None in PACs FINDINGS: The bones are subjectively adequately mineralized. There is no acute or healing fracture. There is no lytic or blastic lesion. No definite joint effusion is observed. There is some soft tissue fullness over the proximal forearm dorsally. IMPRESSION: No acute bony abnormality is observed. I cannot exclude a hematoma or hemorrhagic bursitis involving the extensor surface of the elbow and proximal forearm. Electronically Signed   By: David  Martinique M.D.   On: 09/30/2016 14:52    Assessment/Plan 1. Late onset Alzheimer's disease without behavioral disturbance -memory continuing to decline with more functional losses (unable to safely drive due to confusion about turns--advised against driving alone), difficulty managing medications--her daughter is much more involved with this  2. Essential hypertension -bp at goal with current therapy after some changes, cont same regimen  3. Senile purpura (HCC) -ongoing easy bruisability due to age and thinning skin, decreased protein in diet  4. Stage 3 chronic kidney disease -for appt with nephrology due to creatinine trending up  5. Swelling of left elbow -suspect she did have a ruptured bursitis given tracking down arm of ecchymoses b/c no known injury  6. Need for influenza vaccination -flu shot given  Labs/tests ordered:  No new Next appt:  4 mos med mgt   Needs prevnar next time b/c one on file was before prevnar was given to adults so cannot be prevnar.  Roiza Wiedel L. Judge Duque, D.O. [facility] [address] [place], [place] [address] Cell Phone (Mon-Fri 8am-5pm):  [phone] On Call:  [phone] & follow prompts after 5pm & weekends Office Phone:  [phone] Office Fax:  [phone]

## 2016-10-09 NOTE — Patient Instructions (Signed)
Please sit down as a family to discuss resuscitation status.

## 2016-10-29 ENCOUNTER — Other Ambulatory Visit: Payer: Self-pay | Admitting: Nephrology

## 2016-10-29 DIAGNOSIS — N184 Chronic kidney disease, stage 4 (severe): Secondary | ICD-10-CM

## 2016-11-03 ENCOUNTER — Telehealth: Payer: Self-pay | Admitting: Internal Medicine

## 2016-11-03 NOTE — Telephone Encounter (Signed)
Received a call while covering the office when electricity was out. Rosendo Gros (or Perrin Smack?) called, [place] seeing patient for her annual visit.  She took her intiial bp and it was 180/80, had her rest and relax and rechecked and it as 152/80, then rechecked again later in visit and it went back up to 160/80 sitting.  Her husband had reported that when he checked it yesterday, it was 180/90.  Oddly, looking over BPs here at the office, all have been 130s/80s with a manual cuff.  Pt has been asymptomatic per the nurse.   Ree Alcalde L. Shaunette Gassner, D.O. [facility] [address], [place] [address] Cell Phone (Mon-Fri 8am-5pm):  [phone] On Call:  [phone] & follow prompts after 5pm & weekends Office Phone:  [phone] Office Fax:  [phone]

## 2016-11-04 NOTE — Telephone Encounter (Signed)
Spoke with nurse and pt's husband is checking pt's blood pressure sitting and standing with a automatic cuff. Per the nurse her blood pressure's where after she had the patient sit for a while. Please advise

## 2016-11-04 NOTE — Telephone Encounter (Signed)
.  left message to have patient return my call.  

## 2016-11-04 NOTE — Telephone Encounter (Signed)
Please call pt's daughter--see my note in documentation. Suspect pt was anxious with nurse visiting in her home. Need to clarify if pt's bp was checked manually or with an automatic cuff (might explain the discrepancy with readings in our office which have been great).  (Routing comment)

## 2016-11-05 NOTE — Telephone Encounter (Signed)
Phone just rang, no answer, no answering machine, will try again later.

## 2016-11-05 NOTE — Telephone Encounter (Signed)
Had she taken her medication? If it conts to be elevated would recommend office visit

## 2016-11-10 NOTE — Telephone Encounter (Signed)
Still no answer, will wait until pt calls back.

## 2016-11-11 ENCOUNTER — Ambulatory Visit
Admission: RE | Admit: 2016-11-11 | Discharge: 2016-11-11 | Disposition: A | Discharge status: Home / Self Care | Payer: Medicare Other | Source: Ambulatory Visit | Attending: Nephrology | Admitting: Nephrology

## 2016-11-11 DIAGNOSIS — N184 Chronic kidney disease, stage 4 (severe): Secondary | ICD-10-CM

## 2016-11-15 ENCOUNTER — Other Ambulatory Visit: Payer: Self-pay | Admitting: Internal Medicine

## 2016-12-27 ENCOUNTER — Other Ambulatory Visit: Payer: Self-pay | Admitting: Internal Medicine

## 2017-02-09 ENCOUNTER — Encounter: Payer: Self-pay | Admitting: Internal Medicine

## 2017-02-09 ENCOUNTER — Ambulatory Visit: Payer: Medicare Other | Admitting: Internal Medicine

## 2017-02-09 VITALS — BP 160/100 | HR 44 | Temp 98.0°F | Wt 105.0 lb

## 2017-02-09 DIAGNOSIS — G301 Alzheimer's disease with late onset: Secondary | ICD-10-CM

## 2017-02-09 DIAGNOSIS — N183 Chronic kidney disease, stage 3 unspecified: Secondary | ICD-10-CM

## 2017-02-09 DIAGNOSIS — Z23 Encounter for immunization: Secondary | ICD-10-CM

## 2017-02-09 DIAGNOSIS — I10 Essential (primary) hypertension: Secondary | ICD-10-CM | POA: Diagnosis not present

## 2017-02-09 DIAGNOSIS — F0280 Dementia in other diseases classified elsewhere without behavioral disturbance: Secondary | ICD-10-CM

## 2017-02-09 DIAGNOSIS — M8589 Other specified disorders of bone density and structure, multiple sites: Secondary | ICD-10-CM

## 2017-02-09 NOTE — Progress Notes (Signed)
Location:  [facility] clinic Provider:  Emelin Dascenzo L. Mariea Clonts, D.O., C.M.D.  Code Status: DNR--need to complete and scan form next time Goals of Care:  Advanced Directives 09/17/2016  Does Patient Have a Medical Advance Directive? Yes  Type of Paramedic of [place];Living will  Does patient want to make changes to medical advance directive? -  Copy of [place] in Chart? -   Chief Complaint  Patient presents with  . Medical Management of Chronic Issues    65mth follow-up    HPI: Patient is a 84 y.o. female seen today for medical management of chronic diseases.    HTN:  Reports she is not anxious about being here.  She is setting up a pillbox for a week and she is said to be taking them.  Pt's daughter is going to have her brother check on the pillbox to make sure it's correct.    Weight stable at 105lbs.  She does have an appetite.  Food tastes good to her.    Dementia:  Not driving at all anymore.  She feels like she has too much idle time.  Her daughter would like her involved in more things, but not obligations.  Discussed increasing mobile meal delivery (she rides with her husband to do this only once a month--up to once a week).  She has given up playing piano at [employer] and at church.  Taking her aricept and namenda XR.  They are [facility] [employer].    Elbow swelling resolved.    CKD3:  Her daughter had gotten both parents special water bottles to hydrate.  She is not doing well with that.    Past Medical History:  Diagnosis Date  . Acute bronchitis   . Benign neoplasm of uterus, part unspecified   . Chronic ischemic heart disease, unspecified   . Chronic kidney disease, stage II (mild)   . Cough   . Encounter for long-term (current) use of other medications   . Fall 01/30/2016  . Ganglion, unspecified   . Hearing loss   . Hyperlipidemia   . Hypertension   . Impacted cerumen   . Light-headed feeling 09/25/2015    . Macular degeneration (senile) of retina, unspecified   . Mitral valve disorders(424.0)   . Nonspecific abnormal electrocardiogram (ECG) (EKG)   . Nonspecific abnormal electrocardiogram (ECG) (EKG)   . Osteoporosis, unspecified   . Other abnormal blood chemistry   . Other emphysema ([place])   . Other malaise and fatigue   . Palpitations   . Senile cataract, unspecified   . Senile purpura ([place]) 06/06/2015  . Thrombocytopenia, unspecified ([place])   . Unspecified hearing loss     Past Surgical History:  Procedure Laterality Date  . KNEE ARTHROSCOPY     right    Allergies  Allergen Reactions  . Penicillins     Outpatient Encounter Medications as of 02/09/2017  Medication Sig  . acetaminophen (TYLENOL) 500 MG tablet Take 500 mg by mouth every 6 (six) hours as needed.  Marland Kitchen aspirin 81 MG tablet Take 81 mg by mouth. Take one tablet every other day  . atorvastatin (LIPITOR) 10 MG tablet TAKE 1 TABLET ONCE DAILY TO LOWER CHOLESTEROL.  Marland Kitchen donepezil (ARICEPT) 10 MG tablet Take 1 tablet (10 mg total) by mouth at bedtime.  Marland Kitchen losartan (COZAAR) 100 MG tablet Take 50 mg by mouth daily.  . memantine (NAMENDA XR) 28 MG CP24 24 hr capsule TAKE  1 CAPSULE DAILY.  . metoprolol succinate (TOPROL-XL) 50 MG 24 hr tablet Take 50 mg by mouth. Take one tablet as needed every 8 hours for palpitations.  . Multiple Vitamins-Minerals ([employer] SPECTRAVITE PO) Take 1 tablet by mouth daily.  . nebivolol (BYSTOLIC) 5 MG tablet Take 1 tablet (5 mg total) by mouth daily.  Marland Kitchen Specialty Vitamins Products (LIPOTRIAD VISION SUPPORT PO) Take 1 tablet by mouth daily.  . [DISCONTINUED] CALCIUM-VITAMIN D PO Take by mouth 2 (two) times daily.    . [DISCONTINUED] estradiol (ESTRACE) 0.1 MG/GM vaginal cream Place 2 g vaginally at bedtime.   No facility-administered encounter medications on file as of 02/09/2017.     Review of Systems:  Review of Systems  Constitutional: Negative for chills, fever, malaise/fatigue and weight loss.   HENT: Positive for hearing loss. Negative for congestion.        Has hearing aids  Eyes: Negative for blurred vision.       Glasses  Respiratory: Positive for cough. Negative for shortness of breath.   Cardiovascular: Negative for chest pain, palpitations and leg swelling.  Gastrointestinal: Negative for abdominal pain, blood in stool, constipation and melena.  Genitourinary: Negative for dysuria.  Musculoskeletal: Negative for falls and joint pain.  Skin: Negative for itching and rash.  Neurological: Negative for loss of consciousness and weakness.       Unsteady on feet  Endo/Heme/Allergies: Bruises/bleeds easily.  Psychiatric/Behavioral: Positive for memory loss. Negative for depression.    Health Maintenance  Topic Date Due  . PNA vac Low Risk Adult (2 of 2 - PCV13) 11/23/2013  . MAMMOGRAM  11/22/2016  . TETANUS/TDAP  11/26/2022  . INFLUENZA VACCINE  Completed  . DEXA SCAN  Completed    Physical Exam: Vitals:   02/09/17 1030  BP: (!) 160/100  Pulse: (!) 44  Temp: 98 F (36.7 C)  TempSrc: Oral  SpO2: 98%  Weight: 105 lb (47.6 kg)   Body mass index is 17.08 kg/m. Physical Exam  Constitutional: She is oriented to person, place, and time.  Tall thin female, NAD  HENT:  Head: Normocephalic and atraumatic.  Cardiovascular: Normal rate, regular rhythm, normal heart sounds and intact distal pulses.  Pulmonary/Chest: Effort normal and breath sounds normal. No respiratory distress. She has no rales.  Abdominal: Soft. Bowel sounds are normal. She exhibits no distension. There is no tenderness.  Musculoskeletal: Normal range of motion.  Neurological: She is alert and oriented to person, place, and time. No cranial nerve deficit.  Poor historian, looks to her husband and daughter to help  Skin: Skin is warm and dry. Capillary refill takes less than 2 seconds.  Psychiatric: She has a normal mood and affect.    Labs reviewed: Basic Metabolic Panel: Recent Labs     06/02/16 0830 09/01/16 1009 09/14/16 1657  NA 138 139 138  K 4.7 5.0 4.5  CL 105 106 107  CO2 25 26 24   GLUCOSE 85 83 98  BUN 27* 28* 31*  CREATININE 1.62* 1.79* 1.65*  CALCIUM 9.1 9.8 9.4   Liver Function Tests: Recent Labs    05/08/16 1728 06/02/16 0830  AST 30 15  ALT 28 14  ALKPHOS 80 88  BILITOT 0.5 0.7  PROT 6.5 6.3  ALBUMIN 3.9 3.7   No results for input(s): LIPASE, AMYLASE in the last 8760 hours. No results for input(s): AMMONIA in the last 8760 hours. CBC: Recent Labs    05/08/16 1652 09/01/16 1009 09/14/16 1657  WBC 7.6 6.5 6.0  NEUTROABS  --  4,810  --   HGB 12.1* 14.1 14.0  HCT 36.0* 42.8 42.1  MCV 95.6 96.4 97.5  PLT  --  156 162   Lipid Panel: No results for input(s): CHOL, HDL, LDLCALC, TRIG, CHOLHDL, LDLDIRECT in the last 8760 hours. Lab Results  Component Value Date   HGBA1C 5.9 (H) 06/04/2015    Assessment/Plan 1. Late onset Alzheimer's disease without behavioral disturbance -cont aricept and namenda XR -encouraged family to take over filling pillboxes due to concerns that this may be the reason for her extraordinarily high bp and bradycardia this am (? Metoprolol overused and not taking the losartan that she has two bottles of)  2. Essential hypertension -bp sky high today, thinks she took her meds, is having increasing memory loss  -is good about low sodium diet but not exercising--discussed walking on a track for this and for purposes of bone density maintenance -check bp each evening after dinner and record -cont same regimen but son to take over her medication mgt--they are to call if bp readings are staying consistently over 150/90 -HR returned to normal by time of examination  3. Stage 3 chronic kidney disease ([place]) -encouraged her to hydrate, avoid nsaids  4. Osteopenia of multiple sites - may need to restart vitamin D capsules, but check bone density, resume a walking regimen - DG Bone Density; Future  5. Need for  vaccination with 13-polyvalent pneumococcal conjugate vaccine -prevnar given   Labs/tests ordered:   Orders Placed This Encounter  Procedures  . DG Bone Density    Standing Status:   Future    Standing Expiration Date:   04/10/2018    Scheduling Instructions:     Arrange via daughter's phone number    Order Specific Question:   Reason for Exam (SYMPTOM  OR DIAGNOSIS REQUIRED)    Answer:   osteopenia in 11/16, needs reassessment, has been off vitamin D and calcium supplements    Order Specific Question:   Preferred imaging location?    Answer:   External    Comments:   [facility]    Next appt:  4 mos med mgt, come fasting for labs  Sarafina Puthoff L. Adrie Picking, D.O. [facility] [address], [place] [address] Cell Phone (Mon-Fri 8am-5pm):  [phone] On Call:  [phone] & follow prompts after 5pm & weekends Office Phone:  [phone] Office Fax:  [phone]

## 2017-02-09 NOTE — Patient Instructions (Addendum)
Drink more water. Jonni Sanger to check on the pillbox--I recommend he start filling it for her.   Please look into walking on a track at [facility] [employer] or a local [employer].

## 2017-02-16 LAB — HM DEXA SCAN

## 2017-02-20 ENCOUNTER — Encounter: Payer: Self-pay | Admitting: *Deleted

## 2017-02-23 ENCOUNTER — Telehealth: Payer: Self-pay | Admitting: *Deleted

## 2017-02-23 NOTE — Telephone Encounter (Signed)
Spoke with daughter about bone density results, per Dr. Mariea Clonts " stable osteopenia, no changes to regimen"

## 2017-04-03 ENCOUNTER — Other Ambulatory Visit: Payer: Self-pay | Admitting: Internal Medicine

## 2017-05-30 ENCOUNTER — Other Ambulatory Visit: Payer: Self-pay | Admitting: Internal Medicine

## 2017-06-11 ENCOUNTER — Ambulatory Visit (INDEPENDENT_AMBULATORY_CARE_PROVIDER_SITE_OTHER): Payer: Medicare Other | Admitting: Internal Medicine

## 2017-06-11 ENCOUNTER — Encounter: Payer: Self-pay | Admitting: Internal Medicine

## 2017-06-11 VITALS — BP 158/80 | HR 56 | Temp 97.5°F | Ht 66.0 in | Wt 106.0 lb

## 2017-06-11 DIAGNOSIS — R739 Hyperglycemia, unspecified: Secondary | ICD-10-CM | POA: Diagnosis not present

## 2017-06-11 DIAGNOSIS — G301 Alzheimer's disease with late onset: Secondary | ICD-10-CM | POA: Diagnosis not present

## 2017-06-11 DIAGNOSIS — R636 Underweight: Secondary | ICD-10-CM

## 2017-06-11 DIAGNOSIS — I10 Essential (primary) hypertension: Secondary | ICD-10-CM

## 2017-06-11 DIAGNOSIS — F0280 Dementia in other diseases classified elsewhere without behavioral disturbance: Secondary | ICD-10-CM | POA: Diagnosis not present

## 2017-06-11 DIAGNOSIS — M8580 Other specified disorders of bone density and structure, unspecified site: Secondary | ICD-10-CM | POA: Diagnosis not present

## 2017-06-11 DIAGNOSIS — N184 Chronic kidney disease, stage 4 (severe): Secondary | ICD-10-CM | POA: Diagnosis not present

## 2017-06-11 DIAGNOSIS — E785 Hyperlipidemia, unspecified: Secondary | ICD-10-CM

## 2017-06-11 NOTE — Progress Notes (Signed)
Location:  [facility] clinic Provider:  Norberto Wishon L. Mariea Clonts, D.O., C.M.D.  Code Status: DNR Goals of Care:  Advanced Directives 06/11/2017  Does Patient Have a Medical Advance Directive? Yes  Type of Paramedic of [place];Living will  Does patient want to make changes to medical advance directive? No - Patient declined  Copy of [place] in Chart? Yes     Chief Complaint  Patient presents with  . Medical Management of Chronic Issues    33mth follow-up    HPI: Patient is a 84 y.o. female seen today for medical management of chronic diseases.    No dizziness lately.  BP not getting checked at home.    Theodoro Doing reports no complaints.  They've been walking, but not daily.  More since it warmed up.    She knows her memory is not good.  Short-term memory a little worse morseo.  Has a lot of automatic response of "I don't remember," but with probing, will catch on.    No pain.  Sleeps very well.  Bowels are moving well.  Appetite is good.   She saw renal who felt her CKD4 was age and htn related.  No significant sediment.  F/u 1 year.  Weight came up slightly.    Mammogram due in November with [facility].   January she had stable osteopenia.    Past Medical History:  Diagnosis Date  . Acute bronchitis   . Benign neoplasm of uterus, part unspecified   . Chronic ischemic heart disease, unspecified   . Chronic kidney disease, stage II (mild)   . Cough   . Encounter for long-term (current) use of other medications   . Fall 01/30/2016  . Ganglion, unspecified   . Hearing loss   . Hyperlipidemia   . Hypertension   . Impacted cerumen   . Light-headed feeling 09/25/2015  . Macular degeneration (senile) of retina, unspecified   . Mitral valve disorders(424.0)   . Nonspecific abnormal electrocardiogram (ECG) (EKG)   . Nonspecific abnormal electrocardiogram (ECG) (EKG)   . Osteoporosis, unspecified   . Other abnormal blood chemistry   . Other emphysema  ([place])   . Other malaise and fatigue   . Palpitations   . Senile cataract, unspecified   . Senile purpura ([place]) 06/06/2015  . Thrombocytopenia, unspecified ([place])   . Unspecified hearing loss     Past Surgical History:  Procedure Laterality Date  . KNEE ARTHROSCOPY     right    Allergies  Allergen Reactions  . Penicillins     Outpatient Encounter Medications as of 06/11/2017  Medication Sig  . acetaminophen (TYLENOL) 500 MG tablet Take 500 mg by mouth every 6 (six) hours as needed.  Marland Kitchen aspirin 81 MG tablet Take 81 mg by mouth. Take one tablet every other day  . atorvastatin (LIPITOR) 10 MG tablet TAKE 1 TABLET ONCE DAILY TO LOWER CHOLESTEROL.  Marland Kitchen donepezil (ARICEPT) 10 MG tablet TAKE ONE TABLET AT BEDTIME.  Marland Kitchen losartan (COZAAR) 100 MG tablet TAKE 1 TABLET DAILY TO CONTROL BLOOD PRESSURE.  . memantine (NAMENDA XR) 28 MG CP24 24 hr capsule TAKE 1 CAPSULE DAILY.  . metoprolol succinate (TOPROL-XL) 50 MG 24 hr tablet Take 50 mg by mouth. Take one tablet as needed every 8 hours for palpitations.  . Multiple Vitamins-Minerals (CVS SPECTRAVITE PO) Take 1 tablet by mouth daily.  . nebivolol (BYSTOLIC) 5 MG tablet Take 1 tablet (5 mg total) by mouth daily.  Marland Kitchen Specialty Vitamins Products (  LIPOTRIAD VISION SUPPORT PO) Take 1 tablet by mouth daily.   No facility-administered encounter medications on file as of 06/11/2017.     Review of Systems:  Review of Systems  Constitutional: Negative for chills, fever, malaise/fatigue and weight loss.  HENT: Positive for hearing loss. Negative for congestion.        Hearing aids  Eyes:       Glasses  Respiratory: Negative for cough and shortness of breath.   Cardiovascular: Negative for chest pain and palpitations.  Gastrointestinal: Negative for abdominal pain, blood in stool, constipation and melena.  Genitourinary: Negative for dysuria.  Musculoskeletal: Negative for falls and joint pain.  Skin: Negative for itching and rash.  Neurological:  Negative for dizziness, loss of consciousness and headaches.  Psychiatric/Behavioral: Positive for memory loss. Negative for depression. The patient is not nervous/anxious.     Health Maintenance  Topic Date Due  . MAMMOGRAM  11/22/2016  . INFLUENZA VACCINE  08/20/2017  . TETANUS/TDAP  11/26/2022  . DEXA SCAN  Completed  . PNA vac Low Risk Adult  Completed    Physical Exam: Vitals:   06/11/17 0952  BP: (!) 158/80  Pulse: (!) 56  Temp: (!) 97.5 F (36.4 C)  TempSrc: Oral  SpO2: 97%  Weight: 106 lb (48.1 kg)  Height: 5\' 6"  (1.676 m)   Body mass index is 17.11 kg/m. Physical Exam  Constitutional: She is oriented to person, place, and time. No distress.  Thin female  HENT:  Head: Normocephalic and atraumatic.  Hearing aids  Eyes:  glasses  Cardiovascular: Normal rate, regular rhythm, normal heart sounds and intact distal pulses.  Pulmonary/Chest: Effort normal and breath sounds normal. No respiratory distress.  Abdominal: Bowel sounds are normal.  Musculoskeletal: Normal range of motion.  Neurological: She is alert and oriented to person, place, and time.  But short-term memory loss--unable to provide reliable history w/o support of husband and daughter  Skin: Skin is warm and dry.  Psychiatric: She has a normal mood and affect.    Labs reviewed: Basic Metabolic Panel: Recent Labs    09/01/16 1009 09/14/16 1657  NA 139 138  K 5.0 4.5  CL 106 107  CO2 26 24  GLUCOSE 83 98  BUN 28* 31*  CREATININE 1.79* 1.65*  CALCIUM 9.8 9.4   Liver Function Tests: No results for input(s): AST, ALT, ALKPHOS, BILITOT, PROT, ALBUMIN in the last 8760 hours. No results for input(s): LIPASE, AMYLASE in the last 8760 hours. No results for input(s): AMMONIA in the last 8760 hours. CBC: Recent Labs    09/01/16 1009 09/14/16 1657  WBC 6.5 6.0  NEUTROABS 4,810  --   HGB 14.1 14.0  HCT 42.8 42.1  MCV 96.4 97.5  PLT 156 162   Lipid Panel: No results for input(s): CHOL, HDL,  LDLCALC, TRIG, CHOLHDL, LDLDIRECT in the last 8760 hours. Lab Results  Component Value Date   HGBA1C 5.9 (H) 06/04/2015    Assessment/Plan 1. Essential hypertension -remains elevated here -to resume home bp checks and call with readings after 2 weeks, cont same regimen, does seem nervous here so could have some white coat component and I'm concerned about lowering it too much and causing dizziness b/c she's had that before - CBC with Differential/Platelet - COMPLETE METABOLIC PANEL WITH GFR  2. Late onset Alzheimer's disease without behavioral disturbance -progressing gradually, less able to give history each visit -continues on aricept and namenda, supportive husband and daughter -family helps with meds and appts  3. Chronic kidney disease (CKD), stage IV (severe) (HCC) -advanced, but felt to be bp related -will need more data from home on bps to adjust medications and ensure bp really is high on the regular - COMPLETE METABOLIC PANEL WITH GFR -still not doing so well with hydrating--encouraged again today and daughter was going to make sure she was using the cups with measurements that they both have  4. Hyperglycemia - minmal, monitor, has gained a little weight which is good b/c she was losing and high risk for malnutrition with her dementia dx - Hemoglobin A1c  5. Hyperlipidemia, unspecified hyperlipidemia type - on low dose lipitor - cont same and f/u lab today as did come fasting as directed - Lipid panel  6. Senile osteopenia - ongoing, appears vitamins fell off her med list--should be at least on vitamin D3 2000 units--will need to remind them of this next visit - COMPLETE METABOLIC PANEL WITH GFR -does do some walking for exercise  7. Underweight -encouraged ensure, more foods she enjoys and proper hydration  Due for mammogram in November and they are wanting to still pursue this.  Labs/tests ordered:   Orders Placed This Encounter  Procedures  . CBC with  Differential/Platelet  . Hemoglobin A1c  . COMPLETE METABOLIC PANEL WITH GFR  . Lipid panel   Next appt:  6 mos med mgt, but to call with bp readings at 2 weeks if staying over 150/90  Shakeda Pearse L. Casondra Gasca, D.O. [facility] [address], [place] [address] Cell Phone (Mon-Fri 8am-5pm):  [phone] On Call:  [phone] & follow prompts after 5pm & weekends Office Phone:  [phone] Office Fax:  [phone]

## 2017-06-11 NOTE — Patient Instructions (Addendum)
Resume ensure--one per day.    Mammogram is due in November.    Resume bp checks at home and call with readings  Drink more water--use cups your daughter bought you

## 2017-06-12 LAB — CBC WITH DIFFERENTIAL/PLATELET
Basophils Absolute: 49 cells/uL (ref 0–200)
Basophils Relative: 1 %
Eosinophils Absolute: 88 cells/uL (ref 15–500)
Eosinophils Relative: 1.8 %
HCT: 44.2 % (ref 35.0–45.0)
Hemoglobin: 15.1 g/dL (ref 11.7–15.5)
Lymphs Abs: 1362 cells/uL (ref 850–3900)
MCH: 31.8 pg (ref 27.0–33.0)
MCHC: 34.2 g/dL (ref 32.0–36.0)
MCV: 93.1 fL (ref 80.0–100.0)
MPV: 11.1 fL (ref 7.5–12.5)
Monocytes Relative: 8 %
Neutro Abs: 3009 cells/uL (ref 1500–7800)
Neutrophils Relative %: 61.4 %
Platelets: 166 10*3/uL (ref 140–400)
RBC: 4.75 10*6/uL (ref 3.80–5.10)
RDW: 11.7 % (ref 11.0–15.0)
Total Lymphocyte: 27.8 %
WBC mixed population: 392 cells/uL (ref 200–950)
WBC: 4.9 10*3/uL (ref 3.8–10.8)

## 2017-06-12 LAB — COMPLETE METABOLIC PANEL WITH GFR
AG Ratio: 1.4 (calc) (ref 1.0–2.5)
ALT: 12 U/L (ref 6–29)
AST: 12 U/L (ref 10–35)
Albumin: 4 g/dL (ref 3.6–5.1)
Alkaline phosphatase (APISO): 80 U/L (ref 33–130)
BUN/Creatinine Ratio: 23 (calc) — ABNORMAL HIGH (ref 6–22)
BUN: 39 mg/dL — ABNORMAL HIGH (ref 7–25)
CO2: 28 mmol/L (ref 20–32)
Calcium: 9.6 mg/dL (ref 8.6–10.4)
Chloride: 107 mmol/L (ref 98–110)
Creat: 1.73 mg/dL — ABNORMAL HIGH (ref 0.60–0.88)
GFR, Est African American: 31 mL/min/{1.73_m2} — ABNORMAL LOW (ref >=60)
GFR, Est Non African American: 27 mL/min/{1.73_m2} — ABNORMAL LOW (ref >=60)
Globulin: 2.8 g/dL (calc) (ref 1.9–3.7)
Glucose, Bld: 88 mg/dL (ref 65–99)
Potassium: 5 mmol/L (ref 3.5–5.3)
Sodium: 141 mmol/L (ref 135–146)
Total Bilirubin: 0.6 mg/dL (ref 0.2–1.2)
Total Protein: 6.8 g/dL (ref 6.1–8.1)

## 2017-06-12 LAB — LIPID PANEL
Cholesterol: 199 mg/dL (ref <200)
HDL: 68 mg/dL (ref >50)
LDL Cholesterol (Calc): 114 mg/dL (calc) — ABNORMAL HIGH
Non-HDL Cholesterol (Calc): 131 mg/dL (calc) — ABNORMAL HIGH (ref <130)
Total CHOL/HDL Ratio: 2.9 (calc) (ref <5.0)
Triglycerides: 80 mg/dL (ref <150)

## 2017-06-12 LAB — HEMOGLOBIN A1C
Hgb A1c MFr Bld: 5.7 % of total Hgb — ABNORMAL HIGH (ref <5.7)
Mean Plasma Glucose: 117 (calc)
eAG (mmol/L): 6.5 (calc)

## 2017-06-17 ENCOUNTER — Encounter: Payer: Self-pay | Admitting: Family Medicine

## 2017-06-18 ENCOUNTER — Encounter: Payer: Self-pay | Admitting: *Deleted

## 2017-06-23 ENCOUNTER — Telehealth: Payer: Self-pay | Admitting: *Deleted

## 2017-06-23 NOTE — Telephone Encounter (Signed)
Daughter [name] notified and agreed. Will call us back with BP readings.

## 2017-06-23 NOTE — Telephone Encounter (Signed)
Are the morning readings at least an hour after her morning medications?   Let's have her hold her blood pressure medications for tomorrow.  Check the blood pressure in the morning and again before supper tomorrow and call with results.  She should continue to push fluids.

## 2017-06-23 NOTE — Telephone Encounter (Signed)
Patient daughter, [name] called and stated that patient's blood pressure has been running low. Patient complains of not feeling well. No complaints of dizziness. No pulse taken. Pushing fluids. Daughter stated that patient is taking her medications as prescribed because she checks them. Please Advise.   06/11/2017- at [place] 180/90 06/14/2017-145/85 06/17/2017-am-107/68       Pm-124/72 06/18/2017-92/66 06/21/2017- 89/60 06/22/2017- 101/59

## 2017-06-24 NOTE — Telephone Encounter (Signed)
Spoke with daughter and advised results, daughter asked if the blood pressure starts to go up should she resume medications? Per verbal from Dr. Mariea Clonts if BP is 150/90 yes resume medications. Per daughter, pt should be back Saturday or Sunday, I advised to call the office for appt when pt gets home.

## 2017-06-24 NOTE — Telephone Encounter (Signed)
Suggestions:  Continue to hold blood pressures medications and push fluids.  If Lindsey Salazar still feels bad, she will need to be evaluated in an urgent care in [place].

## 2017-06-24 NOTE — Telephone Encounter (Signed)
[name], daughter called back this morning and stated that patient's blood pressure is running low. Check 4 times this morning. Has not taken her blood pressure medications. Daughter states that she is taking 2 different blood pressure medications, Bystolic and Losartan. Patient has gone out of town with family to [place]. Daughter is concerned.  Please Advise  BP: 88/59   Pulse: 66       102/66            67        85/57             66        97/60             64

## 2017-06-29 ENCOUNTER — Other Ambulatory Visit: Payer: Self-pay | Admitting: Internal Medicine

## 2017-07-02 ENCOUNTER — Encounter: Payer: Self-pay | Admitting: Nurse Practitioner

## 2017-07-02 ENCOUNTER — Ambulatory Visit (INDEPENDENT_AMBULATORY_CARE_PROVIDER_SITE_OTHER): Payer: Medicare Other | Admitting: Nurse Practitioner

## 2017-07-02 VITALS — BP 144/78 | HR 66 | Temp 98.1°F | Ht 66.0 in | Wt 110.0 lb

## 2017-07-02 DIAGNOSIS — B3731 Acute candidiasis of vulva and vagina: Secondary | ICD-10-CM

## 2017-07-02 DIAGNOSIS — R8290 Unspecified abnormal findings in urine: Secondary | ICD-10-CM | POA: Diagnosis not present

## 2017-07-02 DIAGNOSIS — I10 Essential (primary) hypertension: Secondary | ICD-10-CM

## 2017-07-02 DIAGNOSIS — B373 Candidiasis of vulva and vagina: Secondary | ICD-10-CM | POA: Diagnosis not present

## 2017-07-02 LAB — POCT URINALYSIS DIPSTICK
BILIRUBIN UA: NEGATIVE
Blood, UA: NEGATIVE
GLUCOSE UA: NEGATIVE
KETONES UA: NEGATIVE
Nitrite, UA: NEGATIVE
PH UA: 6 (ref 5.0–8.0)
Protein, UA: POSITIVE — AB
Spec Grav, UA: 1.01 (ref 1.010–1.025)
UROBILINOGEN UA: 0.2 U/dL

## 2017-07-02 MED ORDER — FLUCONAZOLE 150 MG PO TABS: 150.0000 mg | ORAL_TABLET | Freq: Once | ORAL | 0 refills | Status: AC

## 2017-07-02 NOTE — Progress Notes (Signed)
Careteam: Patient Care Team: Gayland Curry, DO as PCP - General (Geriatric Medicine) Minus Breeding, MD as Consulting Physician (Cardiology) Stark Klein, MD as Consulting Physician (General Surgery) Netta Cedars, MD as Consulting Physician (Orthopedic Surgery) Richmond Campbell, MD as Consulting Physician (Gastroenterology) Nat Christen, MD as Attending Physician (Optometry) Hortencia Pilar, MD as Consulting Physician (Surgery)  Advanced Directive information    Allergies  Allergen Reactions  . Penicillins     Chief Complaint  Patient presents with  . Acute Visit    Vaginal burning all the time x 10 days.   . Medication Management    Discuss Losartan, patient not sure how and when to take. Patient is also unsure of several of her medication and would like direction       HPI: Patient is a 84 y.o. female seen in the office today due to burning sensation for 7-10 days vaginal area. Mild burning all the time. No pain when she urinates. No increase in urination.  No vaginal discharge No abdominal pain No fevers.   On 06/23/17-when she was on vacation in [place] and noted to have low blood pressure, called office and was advised to hold medication. Husband here with her but daughter was called because they were not sure if she was taking. She is NOT taking medication at this time.   Review of Systems:  Review of Systems  Constitutional: Negative for chills, fever and malaise/fatigue.  Genitourinary: Negative for dysuria, flank pain, frequency, hematuria and urgency.       Vaginal burning  Skin: Negative for itching and rash.    Past Medical History:  Diagnosis Date  . Acute bronchitis   . Benign neoplasm of uterus, part unspecified   . Chronic ischemic heart disease, unspecified   . Chronic kidney disease, stage II (mild)   . Cough   . Encounter for long-term (current) use of other medications   . Fall 01/30/2016  . Ganglion, unspecified   .  Hearing loss   . Hyperlipidemia   . Hypertension   . Impacted cerumen   . Light-headed feeling 09/25/2015  . Macular degeneration (senile) of retina, unspecified   . Mitral valve disorders(424.0)   . Nonspecific abnormal electrocardiogram (ECG) (EKG)   . Nonspecific abnormal electrocardiogram (ECG) (EKG)   . Osteoporosis, unspecified   . Other abnormal blood chemistry   . Other emphysema ([place])   . Other malaise and fatigue   . Palpitations   . Senile cataract, unspecified   . Senile purpura ([place]) 06/06/2015  . Thrombocytopenia, unspecified ([place])   . Unspecified hearing loss    Past Surgical History:  Procedure Laterality Date  . KNEE ARTHROSCOPY     right   Social History:   reports that she has never smoked. She has never used smokeless tobacco. She reports that she does not drink alcohol or use drugs.  Family History  Problem Relation Age of Onset  . Stroke Brother   . Cancer Sister        lung  . Parkinsonism Father        Alzheimer's    Medications: Patient's Medications  New Prescriptions   No medications on file  Previous Medications   ACETAMINOPHEN (TYLENOL) 500 MG TABLET    Take 500 mg by mouth every 6 (six) hours as needed.   ASPIRIN 81 MG TABLET    Take 81 mg by mouth. Take one tablet every other day   ATORVASTATIN (LIPITOR) 10 MG TABLET  TAKE 1 TABLET ONCE DAILY TO LOWER CHOLESTEROL.   DONEPEZIL (ARICEPT) 10 MG TABLET    TAKE ONE TABLET AT BEDTIME.   LOSARTAN (COZAAR) 100 MG TABLET    TAKE 1 TABLET DAILY TO CONTROL BLOOD PRESSURE.   MEMANTINE (NAMENDA XR) 28 MG CP24 24 HR CAPSULE    TAKE 1 CAPSULE DAILY.   METOPROLOL SUCCINATE (TOPROL-XL) 50 MG 24 HR TABLET    Take 50 mg by mouth. Take one tablet as needed every 8 hours for palpitations.   MULTIPLE VITAMINS-MINERALS ([employer] SPECTRAVITE PO)    Take 1 tablet by mouth daily.   NEBIVOLOL (BYSTOLIC) 5 MG TABLET    Take 1 tablet (5 mg total) by mouth daily.   SPECIALTY VITAMINS PRODUCTS (LIPOTRIAD VISION SUPPORT PO)     Take 1 tablet by mouth daily.  Modified Medications   No medications on file  Discontinued Medications   No medications on file     Physical Exam:  Vitals:   07/02/17 1452  BP: (!) 144/78  Pulse: 66  Temp: 98.1 F (36.7 C)  TempSrc: Oral  SpO2: 98%  Weight: 110 lb (49.9 kg)  Height: 5\' 6"  (1.676 m)   Body mass index is 17.75 kg/m.  Physical Exam  Constitutional: She is oriented to person, place, and time. No distress.  Thin female  HENT:  Head: Normocephalic and atraumatic.  Hearing aids  Eyes:  glasses  Abdominal: Soft. Bowel sounds are normal. She exhibits no distension and no mass. There is no tenderness. There is no guarding.  Genitourinary: No vaginal discharge found.  Genitourinary Comments: Yeast rash noted to labia and vaginal area  Musculoskeletal: Normal range of motion.  Neurological: She is alert and oriented to person, place, and time.  But short-term memory loss--unable to provide reliable history w/o support of husband and daughter  Skin: Skin is warm and dry.  Psychiatric: She has a normal mood and affect.    Labs reviewed: Basic Metabolic Panel: Recent Labs    09/01/16 1009 09/14/16 1657 06/11/17 1100  NA 139 138 141  K 5.0 4.5 5.0  CL 106 107 107  CO2 26 24 28   GLUCOSE 83 98 88  BUN 28* 31* 39*  CREATININE 1.79* 1.65* 1.73*  CALCIUM 9.8 9.4 9.6   Liver Function Tests: Recent Labs    06/11/17 1100  AST 12  ALT 12  BILITOT 0.6  PROT 6.8   No results for input(s): LIPASE, AMYLASE in the last 8760 hours. No results for input(s): AMMONIA in the last 8760 hours. CBC: Recent Labs    09/01/16 1009 09/14/16 1657 06/11/17 1100  WBC 6.5 6.0 4.9  NEUTROABS 4,810  --  3,009  HGB 14.1 14.0 15.1  HCT 42.8 42.1 44.2  MCV 96.4 97.5 93.1  PLT 156 162 166   Lipid Panel: Recent Labs    06/11/17 1100  CHOL 199  HDL 68  LDLCALC 114*  TRIG 80  CHOLHDL 2.9   TSH: No results for input(s): TSH in the last 8760 hours. A1C: Lab  Results  Component Value Date   HGBA1C 5.7 (H) 06/11/2017     Assessment/Plan 1. Vaginal yeast infection - fluconazole (DIFLUCAN) 150 MG tablet; Take 1 tablet (150 mg total) by mouth once for 1 dose.  Dispense: 1 tablet; Refill: 0 -to use monostat cream to effected area twice daily  -to notify if rash worsens or fails to improve.   2. Abnormal urinalysis - POC Urinalysis Dipstick with leukocytes but no other abnormalities.  Denies burning with urination, abdominal discomfort, fever, frequency or urgency.   3. Essential hypertension Home blood pressures reviewed and in appropriate range off medication, mostly sbp around 120.  Medication list updated  Next appt: 12/14/2017 Carlos American. [place], [facility] [phone]

## 2017-07-02 NOTE — Patient Instructions (Signed)
To take diflucan 150 mg by mouth daily  To use monostat cream and apply twice daily to effected area

## 2017-07-08 ENCOUNTER — Other Ambulatory Visit: Payer: Self-pay | Admitting: Internal Medicine

## 2017-08-22 ENCOUNTER — Other Ambulatory Visit: Payer: Self-pay | Admitting: Internal Medicine

## 2017-09-29 ENCOUNTER — Telehealth: Payer: Self-pay | Admitting: *Deleted

## 2017-09-29 NOTE — Telephone Encounter (Signed)
Patient daughter notified and agreed.  Medication list updated.

## 2017-09-29 NOTE — Telephone Encounter (Signed)
Let's restart bystolic 5mg  and monitor bp twice a day for the next week and call back with results.  Please notify her daughter, [name]

## 2017-09-29 NOTE — Telephone Encounter (Signed)
[name], daughter called and stated that patient's blood pressure is running high. Daughter stated that her BP medications was stopped and daughter is wondering if patient shouldn't be taking them due to the high BP reading. Chartered loss adjuster did a nurse visit and it was high at that time also. Was taking Losartan and Bystolic in the past. Please Advise.    09/20/17- pm 143/88 09/21/17- am 140/85  Pm 160/98 09/22/17- am 160/93 09/23/17- am 147/81  Pm 155/79 09/24/17- am 170/93  Pm 139/87 09/25/17- am 191/85  Pm 145/97 09/26/17- am 162/94  Pm 153/89 09/28/17- am 163/98 09/29/17-am 170/110

## 2017-10-16 ENCOUNTER — Other Ambulatory Visit: Payer: Self-pay | Admitting: Internal Medicine

## 2017-10-17 ENCOUNTER — Other Ambulatory Visit: Payer: Self-pay | Admitting: Internal Medicine

## 2017-10-19 ENCOUNTER — Other Ambulatory Visit: Payer: Self-pay | Admitting: Internal Medicine

## 2017-11-04 ENCOUNTER — Encounter: Payer: Self-pay | Admitting: Nurse Practitioner

## 2017-11-04 ENCOUNTER — Ambulatory Visit (INDEPENDENT_AMBULATORY_CARE_PROVIDER_SITE_OTHER): Payer: Medicare Other | Admitting: Nurse Practitioner

## 2017-11-04 VITALS — BP 152/84 | HR 61 | Temp 98.6°F | Ht 66.0 in | Wt 112.0 lb

## 2017-11-04 DIAGNOSIS — R5381 Other malaise: Secondary | ICD-10-CM | POA: Diagnosis not present

## 2017-11-04 DIAGNOSIS — G301 Alzheimer's disease with late onset: Secondary | ICD-10-CM

## 2017-11-04 DIAGNOSIS — I10 Essential (primary) hypertension: Secondary | ICD-10-CM

## 2017-11-04 DIAGNOSIS — J40 Bronchitis, not specified as acute or chronic: Secondary | ICD-10-CM

## 2017-11-04 DIAGNOSIS — F0280 Dementia in other diseases classified elsewhere without behavioral disturbance: Secondary | ICD-10-CM

## 2017-11-04 LAB — BASIC METABOLIC PANEL WITH GFR
BUN / CREAT RATIO: 18 (calc) (ref 6–22)
BUN: 32 mg/dL — ABNORMAL HIGH (ref 7–25)
CALCIUM: 8.7 mg/dL (ref 8.6–10.4)
CHLORIDE: 105 mmol/L (ref 98–110)
CO2: 25 mmol/L (ref 20–32)
Creat: 1.78 mg/dL — ABNORMAL HIGH (ref 0.60–0.88)
GFR, EST AFRICAN AMERICAN: 30 mL/min/{1.73_m2} — AB (ref >=60)
GFR, Est Non African American: 26 mL/min/{1.73_m2} — ABNORMAL LOW (ref >=60)
GLUCOSE: 84 mg/dL (ref 65–139)
POTASSIUM: 5.2 mmol/L (ref 3.5–5.3)
Sodium: 138 mmol/L (ref 135–146)

## 2017-11-04 MED ORDER — LOSARTAN POTASSIUM 25 MG PO TABS: 25.0000 mg | ORAL_TABLET | Freq: Every day | ORAL | 2 refills | Status: DC

## 2017-11-04 NOTE — Patient Instructions (Signed)
Home health ordered and they will reach out to you.  To start losartan 25 mg by mouth daily  Follow up lab in 2 weeks

## 2017-11-04 NOTE — Progress Notes (Signed)
Careteam: Patient Care Team: Gayland Curry, DO as PCP - General (Geriatric Medicine) Minus Breeding, MD as Consulting Physician (Cardiology) Stark Klein, MD as Consulting Physician (General Surgery) Netta Cedars, MD as Consulting Physician (Orthopedic Surgery) Richmond Campbell, MD as Consulting Physician (Gastroenterology) Nat Christen, MD as Attending Physician (Optometry) Hortencia Pilar, MD as Consulting Physician (Surgery)  Advanced Directive information Does Patient Have a Medical Advance Directive?: Yes, Type of Advance Directive: [place];Living will  Allergies  Allergen Reactions  . Penicillins     Chief Complaint  Patient presents with  . Hospitalization Follow-up    Pt was recently admitted to hospital in [place] 10/30/17 to 11/01/17. Record is in care everywhere.      HPI: Patient is a 84 y.o. female seen in the office today for follow up. In [place] has episodes where her legs were not working she went to hospital 10/11-10/13 and also was noted to have bronchitis. Initial thought possible pneumonia but this was ruled out. She was discharged on cefdinir and doxycycline. Culture negative for UTI.  Daughter reports she is getting stronger. Doctor recommended walker which has been an adventure. It was also recommended that she get some PT due to LE weakness. Weakness is worse in the afternoon. Overall she has done much better since she has been home.  Daughter feels like she is at baseline mobility now. Using a walker now but unsure if this is best.   Family has several concerns as Ms Hutchinson's dementia worsens in regards to keeping her as safe and independent as possible at home. Her husband is very independent and they are concerned with her being home alone during the day. One of her sons does live downstairs so he is there in the evening and at night. Her children work and are able to stop in to check on her but not  there a lot during the day. She is currently using the walker which they have concerns over if she is using this properly. Also wanting to plan for the future and thinking about in home care for her.   Blood pressure was low at home therefore losartan was stopped. Taking bystolic 5 mg daily. Blood pressures at home are similar to what it is in office today.   Review of Systems:  Review of Systems  Unable to perform ROS: Dementia    Past Medical History:  Diagnosis Date  . Acute bronchitis   . Benign neoplasm of uterus, part unspecified   . Chronic ischemic heart disease, unspecified   . Chronic kidney disease, stage II (mild)   . Cough   . Encounter for long-term (current) use of other medications   . Fall 01/30/2016  . Ganglion, unspecified   . Hearing loss   . Hyperlipidemia   . Hypertension   . Impacted cerumen   . Light-headed feeling 09/25/2015  . Macular degeneration (senile) of retina, unspecified   . Mitral valve disorders(424.0)   . Nonspecific abnormal electrocardiogram (ECG) (EKG)   . Nonspecific abnormal electrocardiogram (ECG) (EKG)   . Osteoporosis, unspecified   . Other abnormal blood chemistry   . Other emphysema ([place])   . Other malaise and fatigue   . Palpitations   . Senile cataract, unspecified   . Senile purpura ([place]) 06/06/2015  . Thrombocytopenia, unspecified ([place])   . Unspecified hearing loss    Past Surgical History:  Procedure Laterality Date  . KNEE ARTHROSCOPY     right  Social History:   reports that she has never smoked. She has never used smokeless tobacco. She reports that she does not drink alcohol or use drugs.  Family History  Problem Relation Age of Onset  . Stroke Brother   . Cancer Sister        lung  . Parkinsonism Father        Alzheimer's    Medications: Patient's Medications  New Prescriptions   No medications on file  Previous Medications   ACETAMINOPHEN (TYLENOL) 500 MG TABLET    Take 500 mg by mouth every 6 (six)  hours as needed.   ASPIRIN 81 MG TABLET    Take 81 mg by mouth. Take one tablet every other day   ATORVASTATIN (LIPITOR) 10 MG TABLET    TAKE 1 TABLET ONCE DAILY TO LOWER CHOLESTEROL.   BYSTOLIC 5 MG TABLET    TAKE 1 TABLET ONCE DAILY.   DONEPEZIL (ARICEPT) 10 MG TABLET    TAKE ONE TABLET AT BEDTIME.   MEMANTINE (NAMENDA XR) 28 MG CP24 24 HR CAPSULE    TAKE 1 CAPSULE DAILY.   MULTIPLE VITAMINS-MINERALS ([employer] SPECTRAVITE PO)    Take 1 tablet by mouth daily.   SPECIALTY VITAMINS PRODUCTS (LIPOTRIAD VISION SUPPORT PO)    Take 1 tablet by mouth daily.  Modified Medications   No medications on file  Discontinued Medications   LOSARTAN (COZAAR) 100 MG TABLET    TAKE 1 TABLET DAILY TO CONTROL BLOOD PRESSURE.     Physical Exam:  Vitals:   11/04/17 1332  BP: (!) 152/84  Pulse: 61  Temp: 98.6 F (37 C)  TempSrc: Oral  SpO2: 96%  Weight: 112 lb (50.8 kg)  Height: 5\' 6"  (1.676 m)   Body mass index is 18.08 kg/m.  Physical Exam  Constitutional: No distress.  Thin female  HENT:  Head: Normocephalic and atraumatic.  Hearing aids  Eyes:  glasses  Cardiovascular: Normal rate, regular rhythm, normal heart sounds and intact distal pulses.  Pulmonary/Chest: Effort normal and breath sounds normal. No respiratory distress.  Abdominal: Bowel sounds are normal.  Musculoskeletal: Normal range of motion.  Neurological: She is alert.  Daughter providing hx  Skin: Skin is warm and dry.  Psychiatric: She has a normal mood and affect.    Labs reviewed: Basic Metabolic Panel: Recent Labs    06/11/17 1100  NA 141  K 5.0  CL 107  CO2 28  GLUCOSE 88  BUN 39*  CREATININE 1.73*  CALCIUM 9.6   Liver Function Tests: Recent Labs    06/11/17 1100  AST 12  ALT 12  BILITOT 0.6  PROT 6.8   No results for input(s): LIPASE, AMYLASE in the last 8760 hours. No results for input(s): AMMONIA in the last 8760 hours. CBC: Recent Labs    06/11/17 1100  WBC 4.9  NEUTROABS 3,009  HGB 15.1    HCT 44.2  MCV 93.1  PLT 166   Lipid Panel: Recent Labs    06/11/17 1100  CHOL 199  HDL 68  LDLCALC 114*  TRIG 80  CHOLHDL 2.9   TSH: No results for input(s): TSH in the last 8760 hours. A1C: Lab Results  Component Value Date   HGBA1C 5.7 (H) 06/11/2017     Assessment/Plan 1. Debility -episodes of legs not working, hospitalized and found to have bronchitis. Overall mobility has improved but still very unsteady without an assistive device. Uses rolling walker at this time but has not had much guidance on how  to use properly. Daughter reports she is worried she is going to trip in the home (lots of throw rugs). Educated to remove rugs and plan of home health to evaluate and treat. Also for them to come do a home safety assessment  - Ambulatory referral to [place]  2. Late onset Alzheimer's disease without behavioral disturbance (HCC) Progressive decline noted. Recommended not to cook while someone is not at home with her as she is at risk for leaving the stove/oven on. Also discussed with daughter about finding in home care to help assist Ms Hutchinson at this time and can help as they need more assistance with her disease progression. Husband is there and provides a lot of support but sometimes more care is needed.   3. Bronchitis Improving. To continue antibiotic until complete. To add probiotic   4. Essential hypertension -will add losartan back at a lower dose, to follow BMP today and then again in 2 week.  - losartan (COZAAR) 25 MG tablet; Take 1 tablet (25 mg total) by mouth daily.  Dispense: 30 tablet; Refill: 2 - BASIC METABOLIC PANEL WITH GFR  Next appt: 4 weeks with Dr Mariea Clonts, sooner if needed  [place] [facility]. [place], [facility] [phone]

## 2017-11-06 DIAGNOSIS — J438 Other emphysema: Secondary | ICD-10-CM

## 2017-11-06 DIAGNOSIS — N183 Chronic kidney disease, stage 3 (moderate): Secondary | ICD-10-CM

## 2017-11-06 DIAGNOSIS — F0280 Dementia in other diseases classified elsewhere without behavioral disturbance: Secondary | ICD-10-CM

## 2017-11-06 DIAGNOSIS — J209 Acute bronchitis, unspecified: Secondary | ICD-10-CM

## 2017-11-06 DIAGNOSIS — I129 Hypertensive chronic kidney disease with stage 1 through stage 4 chronic kidney disease, or unspecified chronic kidney disease: Secondary | ICD-10-CM

## 2017-11-06 DIAGNOSIS — H3530 Unspecified macular degeneration: Secondary | ICD-10-CM

## 2017-11-06 DIAGNOSIS — G301 Alzheimer's disease with late onset: Secondary | ICD-10-CM

## 2017-11-06 DIAGNOSIS — I259 Chronic ischemic heart disease, unspecified: Secondary | ICD-10-CM

## 2017-11-18 ENCOUNTER — Other Ambulatory Visit: Payer: Medicare Other

## 2017-11-18 DIAGNOSIS — I10 Essential (primary) hypertension: Secondary | ICD-10-CM

## 2017-11-18 LAB — BASIC METABOLIC PANEL WITH GFR
BUN / CREAT RATIO: 17 (calc) (ref 6–22)
BUN: 33 mg/dL — AB (ref 7–25)
CHLORIDE: 103 mmol/L (ref 98–110)
CO2: 27 mmol/L (ref 20–32)
Calcium: 9.1 mg/dL (ref 8.6–10.4)
Creat: 1.98 mg/dL — ABNORMAL HIGH (ref 0.60–0.88)
GFR, Est African American: 27 mL/min/{1.73_m2} — ABNORMAL LOW (ref >=60)
GFR, Est Non African American: 23 mL/min/{1.73_m2} — ABNORMAL LOW (ref >=60)
GLUCOSE: 61 mg/dL — AB (ref 65–139)
Potassium: 5.1 mmol/L (ref 3.5–5.3)
Sodium: 139 mmol/L (ref 135–146)

## 2017-11-19 ENCOUNTER — Other Ambulatory Visit: Payer: Self-pay | Admitting: Nurse Practitioner

## 2017-11-19 MED ORDER — AMLODIPINE BESYLATE 2.5 MG PO TABS: 2.5000 mg | ORAL_TABLET | Freq: Every day | ORAL | 1 refills | Status: DC

## 2017-11-19 NOTE — Addendum Note (Signed)
Addended by: Denyse Amass on: 11/19/2017 01:16 PM   Modules accepted: Orders

## 2017-12-12 ENCOUNTER — Other Ambulatory Visit: Payer: Self-pay | Admitting: Internal Medicine

## 2017-12-14 ENCOUNTER — Ambulatory Visit (INDEPENDENT_AMBULATORY_CARE_PROVIDER_SITE_OTHER): Payer: Medicare Other | Admitting: Internal Medicine

## 2017-12-14 ENCOUNTER — Encounter: Payer: Self-pay | Admitting: Internal Medicine

## 2017-12-14 ENCOUNTER — Other Ambulatory Visit: Payer: Self-pay | Admitting: Internal Medicine

## 2017-12-14 ENCOUNTER — Ambulatory Visit (INDEPENDENT_AMBULATORY_CARE_PROVIDER_SITE_OTHER): Payer: Medicare Other

## 2017-12-14 VITALS — BP 148/78 | HR 57 | Temp 97.8°F | Ht 66.0 in | Wt 107.0 lb

## 2017-12-14 VITALS — BP 162/80 | HR 57 | Temp 97.5°F | Ht 66.0 in | Wt 107.0 lb

## 2017-12-14 DIAGNOSIS — D692 Other nonthrombocytopenic purpura: Secondary | ICD-10-CM

## 2017-12-14 DIAGNOSIS — Z0000 Encounter for general adult medical examination without abnormal findings: Secondary | ICD-10-CM | POA: Diagnosis not present

## 2017-12-14 DIAGNOSIS — N184 Chronic kidney disease, stage 4 (severe): Secondary | ICD-10-CM | POA: Diagnosis not present

## 2017-12-14 DIAGNOSIS — M8580 Other specified disorders of bone density and structure, unspecified site: Secondary | ICD-10-CM | POA: Insufficient documentation

## 2017-12-14 DIAGNOSIS — F0280 Dementia in other diseases classified elsewhere without behavioral disturbance: Secondary | ICD-10-CM

## 2017-12-14 DIAGNOSIS — R636 Underweight: Secondary | ICD-10-CM | POA: Diagnosis not present

## 2017-12-14 DIAGNOSIS — I10 Essential (primary) hypertension: Secondary | ICD-10-CM | POA: Diagnosis not present

## 2017-12-14 DIAGNOSIS — G301 Alzheimer's disease with late onset: Secondary | ICD-10-CM | POA: Diagnosis not present

## 2017-12-14 DIAGNOSIS — M8589 Other specified disorders of bone density and structure, multiple sites: Secondary | ICD-10-CM

## 2017-12-14 DIAGNOSIS — Z681 Body mass index (BMI) 19 or less, adult: Secondary | ICD-10-CM | POA: Insufficient documentation

## 2017-12-14 MED ORDER — VITAMIN D3 50 MCG (2000 UT) PO CAPS: 2000.0000 [IU] | ORAL_CAPSULE | Freq: Every day | ORAL | 5 refills | Status: AC

## 2017-12-14 MED ORDER — ZOSTER VAC RECOMB ADJUVANTED 50 MCG/0.5ML IM SUSR: 0.5000 mL | Freq: Once | INTRAMUSCULAR | 1 refills | Status: AC

## 2017-12-14 MED ORDER — AMLODIPINE BESYLATE 2.5 MG PO TABS: 5.0000 mg | ORAL_TABLET | Freq: Every day | ORAL | 1 refills | Status: DC

## 2017-12-14 NOTE — Progress Notes (Signed)
Subjective:   Lindsey Salazar is a 84 y.o. female who presents for an Initial Medicare Annual Wellness Visit.       Objective:    Today's Vitals   12/14/17 1006  BP: (!) 162/80  Pulse: (!) 57  Temp: (!) 97.5 F (36.4 C)  TempSrc: Oral  SpO2: 96%  Weight: 107 lb (48.5 kg)  Height: 5\' 6"  (1.676 m)   Body mass index is 17.27 kg/m.  Advanced Directives 12/14/2017 11/04/2017 06/11/2017 09/17/2016 09/14/2016 09/01/2016 08/05/2016  Does Patient Have a Medical Advance Directive? Yes Yes Yes Yes No Yes Yes  Type of Paramedic of [place];Living will [place];Living will [place];Living will [place];Living will - [place];Living will [place];Living will  Does patient want to make changes to medical advance directive? No - Patient declined - No - Patient declined - - - -  Copy of [place] in Chart? Yes - validated most recent copy scanned in chart (See row information) Yes Yes - - Yes -    Current Medications (verified) Outpatient Encounter Medications as of 12/14/2017  Medication Sig  . acetaminophen (TYLENOL) 500 MG tablet Take 500 mg by mouth every 6 (six) hours as needed.  Marland Kitchen amLODipine (NORVASC) 2.5 MG tablet Take 1 tablet (2.5 mg total) by mouth daily.  Marland Kitchen aspirin 81 MG tablet Take 81 mg by mouth. Take one tablet every other day  . atorvastatin (LIPITOR) 10 MG tablet TAKE 1 TABLET ONCE DAILY TO LOWER CHOLESTEROL.  . BYSTOLIC 5 MG tablet TAKE 1 TABLET ONCE DAILY.  Marland Kitchen donepezil (ARICEPT) 10 MG tablet TAKE ONE TABLET AT BEDTIME.  . memantine (NAMENDA XR) 28 MG CP24 24 hr capsule TAKE 1 CAPSULE DAILY.  . Multiple Vitamins-Minerals ([employer] SPECTRAVITE PO) Take 1 tablet by mouth daily.  Marland Kitchen Specialty Vitamins Products (LIPOTRIAD VISION SUPPORT PO) Take 1 tablet by mouth daily.  Marland Kitchen Zoster Vaccine Adjuvanted [facility]) injection Inject 0.5 mLs into  the muscle once for 1 dose.  . [DISCONTINUED] Zoster Vaccine Adjuvanted [facility]) injection Inject 0.5 mLs into the muscle once.   No facility-administered encounter medications on file as of 12/14/2017.     Allergies (verified) Penicillins   History: Past Medical History:  Diagnosis Date  . Acute bronchitis   . Benign neoplasm of uterus, part unspecified   . Chronic ischemic heart disease, unspecified   . Chronic kidney disease, stage II (mild)   . Cough   . Encounter for long-term (current) use of other medications   . Fall 01/30/2016  . Ganglion, unspecified   . Hearing loss   . Hyperlipidemia   . Hypertension   . Impacted cerumen   . Light-headed feeling 09/25/2015  . Macular degeneration (senile) of retina, unspecified   . Mitral valve disorders(424.0)   . Nonspecific abnormal electrocardiogram (ECG) (EKG)   . Nonspecific abnormal electrocardiogram (ECG) (EKG)   . Osteoporosis, unspecified   . Other abnormal blood chemistry   . Other emphysema ([place])   . Other malaise and fatigue   . Palpitations   . Senile cataract, unspecified   . Senile purpura ([place]) 06/06/2015  . Thrombocytopenia, unspecified ([place])   . Unspecified hearing loss    Past Surgical History:  Procedure Laterality Date  . KNEE ARTHROSCOPY     right   Family History  Problem Relation Age of Onset  . Stroke Brother   . Cancer Sister  lung  . Parkinsonism Father        Alzheimer's   Social History   Socioeconomic History  . Marital status: Single    Spouse name: Not on file  . Number of children: 4  . Years of education: Not on file  . Highest education level: Not on file  Occupational History    Employer: RETIRED  Social Needs  . Financial resource strain: Not hard at all  . Food insecurity:    Worry: Never true    Inability: Never true  . Transportation needs:    Medical: No    Non-medical: No  Tobacco Use  . Smoking status: Never Smoker  . Smokeless tobacco: Never Used    Substance and Sexual Activity  . Alcohol use: No  . Drug use: No  . Sexual activity: Not Currently  Lifestyle  . Physical activity:    Days per week: 3 days    Minutes per session: 10 min  . Stress: Not at all  Relationships  . Social connections:    Talks on phone: Three times a week    Gets together: Once a week    Attends religious service: More than 4 times per year    Active member of club or organization: No    Attends meetings of clubs or organizations: Never    Relationship status: Married  Other Topics Concern  . Not on file  Social History Narrative   Lives with husband.      Tobacco Counseling Counseling given: Not Answered   Clinical Intake:  Pre-visit preparation completed: No  Pain : No/denies pain     Diabetes: No  How often do you need to have someone help you when you read instructions, pamphlets, or other written materials from your doctor or pharmacy?: 1 - Never What is the last grade level you completed in school?: 1 year college  Interpreter Needed?: No  Information entered by :: [name], RN   Activities of Daily Living In your present state of health, do you have any difficulty performing the following activities: 12/14/2017  Hearing? Y  Vision? N  Difficulty concentrating or making decisions? Y  Walking or climbing stairs? N  Dressing or bathing? N  Doing errands, shopping? N  Preparing Food and eating ? N  Using the Toilet? N  In the past six months, have you accidently leaked urine? N  Do you have problems with loss of bowel control? N  Managing your Medications? Y  Managing your Finances? Y  Housekeeping or managing your Housekeeping? Y  Some recent data might be hidden     Immunizations and Health Maintenance Immunization History  Administered Date(s) Administered  . Influenza Split 09/18/2017  . Influenza, High Dose Seasonal PF 10/09/2016  . Influenza,inj,Quad PF,6+ Mos 11/23/2012, 11/30/2013, 12/06/2014  .  Influenza-Unspecified 10/05/2009, 10/25/2010, 10/13/2011, 11/13/2015  . Pneumococcal Conjugate-13 07/01/2013, 02/09/2017  . Pneumococcal Polysaccharide-23 06/01/2006, 11/23/2012  . Pneumococcal-Unspecified 01/21/1995  . Td 08/20/1992, 06/01/2006, 06/23/2014  . Tdap 11/25/2012  . Zoster 02/11/2006, 11/14/2010   Health Maintenance Due  Topic Date Due  . MAMMOGRAM  11/22/2017    Patient Care Team: Gayland Curry, DO as PCP - General (Geriatric Medicine) Minus Breeding, MD as Consulting Physician (Cardiology) Stark Klein, MD as Consulting Physician (General Surgery) Netta Cedars, MD as Consulting Physician (Orthopedic Surgery) Richmond Campbell, MD as Consulting Physician (Gastroenterology) Nat Christen, MD as Attending Physician (Optometry) Hortencia Pilar, MD as Consulting Physician (Surgery) Madelon Lips, MD as  Consulting Physician (Nephrology)  Indicate any recent Medical Services you may have received from other than [facility] providers in the past year (date may be approximate).     Assessment:   This is a routine wellness examination for Lindsey Salazar.  Hearing/Vision screen Hearing Screening Comments: Bilateral hearing aids Vision Screening Comments: Sees eye doctor annually  Dietary issues and exercise activities discussed: Current Exercise Habits: Home exercise routine, Type of exercise: strength training/weights, Time (Minutes): 10, Frequency (Times/Week): 3, Weekly Exercise (Minutes/Week): 30, Intensity: Mild, Exercise limited by: None identified  Goals   None    Depression Screen PHQ 2/9 Scores 12/14/2017 06/11/2017 02/09/2017 09/30/2016 06/06/2016 05/08/2016 05/08/2016  PHQ - 2 Score 0 0 0 0 0 0 0    Fall Risk Fall Risk  12/14/2017 11/04/2017 07/02/2017 06/11/2017 02/09/2017  Falls in the past year? 0 Yes No No No  Number falls in past yr: 0 1 - - -  Injury with Fall? 0 - - - -  Comment - - - - -  Risk Factor Category  - - - - -    Is the patient's home  free of loose throw rugs in walkways, pet beds, electrical cords, etc?   yes      Grab bars in the bathroom? yes      Handrails on the stairs?   yes      Adequate lighting?   yes  Cognitive Function: MMSE - Mini Mental State Exam 12/14/2017 12/11/2015 06/06/2015 12/06/2014 11/30/2013  Orientation to time 2 5 5 5 5   Orientation to Place 5 5 5 5 5   Registration 3 3 3 3 3   Attention/ Calculation 5 4 5 5 5   Recall 0 1 1 1  0  Language- name 2 objects 2 2 2 2 2   Language- repeat 1 1 1 1 1   Language- follow 3 step command 3 3 3 3 2   Language- read & follow direction 1 1 1 1 1   Write a sentence 1 1 1 1 1   Copy design 0 0 1 1 1   Total score 23 26 28 28 26         Screening Tests Health Maintenance  Topic Date Due  . MAMMOGRAM  11/22/2017  . TETANUS/TDAP  11/26/2022  . INFLUENZA VACCINE  Completed  . DEXA SCAN  Completed  . PNA vac Low Risk Adult  Completed    Qualifies for Shingles Vaccine? Yes, educated and ordered to pharmacy  Cancer Screenings: Lung: Low Dose CT Chest recommended if Age 32-80 years, 30 pack-year currently smoking OR have quit w/in 15years. Patient does not qualify. Breast: Up to date on Mammogram? Yes   Up to date of Bone Density/Dexa? Yes Colorectal: up to date  Additional Screenings:  Hepatitis C Screening: declined     Plan:  I have personally reviewed and addressed the Medicare Annual Wellness questionnaire and have noted the following in the patient's chart:  A. Medical and social history B. Use of alcohol, tobacco or illicit drugs  C. Current medications and supplements D. Functional ability and status E.  Nutritional status F.  Physical activity G. Advance directives H. List of other physicians I.  Hospitalizations, surgeries, and ER visits in previous 12 months J.  [place] to include hearing, vision, cognitive, depression L. Referrals and appointments - none  In addition, I have reviewed and discussed with patient certain  preventive protocols, quality metrics, and best practice recommendations. A written personalized care plan for preventive services as well as general  preventive health recommendations were provided to patient.  See attached scanned questionnaire for additional information.   Signed,   [name], RN Nurse Health Advisor  Patient Concerns: None

## 2017-12-14 NOTE — Patient Instructions (Addendum)
Schedule a mammogram at [facility].   Start vitamin D3 2000 units daily for your bones. Avoid staying home alone.

## 2017-12-14 NOTE — Progress Notes (Signed)
Location:  [facility] clinic Provider:  Onaje Warne L. Mariea Clonts, D.O., C.M.D.  Goals of Care:  Advanced Directives 12/14/2017  Does Patient Have a Medical Advance Directive? Yes  Type of Paramedic of [place];Living will  Does patient want to make changes to medical advance directive? No - Patient declined  Copy of [place] in Chart? Yes - validated most recent copy scanned in chart (See row information)   Chief Complaint  Patient presents with  . Medical Management of Chronic Issues    56mth follow-up    HPI: Patient is a 84 y.o. Salazar seen today for medical management of chronic diseases.    MMSE - Mini Mental State Exam 12/14/2017 12/11/2015 06/06/2015  Orientation to time 2 5 5   Orientation to Place 5 5 5   Registration 3 3 3   Attention/ Calculation 5 4 5   Recall 0 1 1  Language- name 2 objects 2 2 2   Language- repeat 1 1 1   Language- follow 3 step command 3 3 3   Language- read & follow direction 1 1 1   Write a sentence 1 1 1   Copy design 0 0 1  Total score 23 26 28   failed clock drawing.  She is doing well in her opinion.  BP remains higher than it should be.  BPs now are 125/Lindsey at the lowest and as high as 178/96 also.  She's been on amlodipine 2.5mg  and bystolic 5mg  daily.  She's had no dizziness.  No headaches.   She had stable osteopenia.    Weight is down 5 lbs again over the past month.    Her daughter was asking about patient being left alone at all anymore at this point.  We discussed that I don't recommend she be home alone at all anymore.  We discussed [facility] and home care services.  Past Medical History:  Diagnosis Date  . Acute bronchitis   . Benign neoplasm of uterus, part unspecified   . Chronic ischemic heart disease, unspecified   . Chronic kidney disease, stage II (mild)   . Cough   . Encounter for long-term (current) use of other medications   . Fall 01/30/2016  . Ganglion,  unspecified   . Hearing loss   . Hyperlipidemia   . Hypertension   . Impacted cerumen   . Light-headed feeling 09/25/2015  . Macular degeneration (senile) of retina, unspecified   . Mitral valve disorders(424.0)   . Nonspecific abnormal electrocardiogram (ECG) (EKG)   . Nonspecific abnormal electrocardiogram (ECG) (EKG)   . Osteoporosis, unspecified   . Other abnormal blood chemistry   . Other emphysema ([place])   . Other malaise and fatigue   . Palpitations   . Senile cataract, unspecified   . Senile purpura ([place]) 06/06/2015  . Thrombocytopenia, unspecified ([place])   . Unspecified hearing loss     Past Surgical History:  Procedure Laterality Date  . KNEE ARTHROSCOPY     right    Allergies  Allergen Reactions  . Penicillins Nausea And Vomiting    Outpatient Encounter Medications as of 12/14/2017  Medication Sig  . acetaminophen (TYLENOL) 500 MG tablet Take 500 mg by mouth every 6 (six) hours as needed.  Marland Kitchen amLODipine (NORVASC) 2.5 MG tablet Take 1 tablet (2.5 mg total) by mouth daily.  Marland Kitchen aspirin 81 MG tablet Take 81 mg by mouth. Take one tablet every other day  . atorvastatin (LIPITOR) 10 MG tablet TAKE 1 TABLET ONCE DAILY TO LOWER  CHOLESTEROL.  . BYSTOLIC 5 MG tablet TAKE 1 TABLET ONCE DAILY.  Marland Kitchen donepezil (ARICEPT) 10 MG tablet TAKE ONE TABLET AT BEDTIME.  . memantine (NAMENDA XR) 28 MG CP24 24 hr capsule TAKE 1 CAPSULE DAILY.  . Multiple Vitamins-Minerals ([employer]S SPECTRAVITE PO) Take 1 tablet by mouth daily.  Marland Kitchen Specialty Vitamins Products (LIPOTRIAD VISION SUPPORT PO) Take 1 tablet by mouth daily.  Marland Kitchen Zoster Vaccine Adjuvanted [facility]) injection Inject 0.5 mLs into the muscle once for 1 dose.   No facility-administered encounter medications on file as of 12/14/2017.     Review of Systems:  Review of Systems  Constitutional: Positive for weight loss. Negative for chills, fever and malaise/fatigue.  HENT: Negative for congestion.   Eyes: Negative for blurred vision.        Glasses  Respiratory: Negative for cough and shortness of breath.   Cardiovascular: Negative for chest pain, palpitations and leg swelling.  Gastrointestinal: Negative for abdominal pain, blood in stool, constipation, diarrhea and melena.  Genitourinary: Negative for dysuria.       Urinary incontinence must be worse based on smell of urine when entering exam room, but daughter and husband are unaware of change  Musculoskeletal: Negative for falls and joint pain.       Unsteady and was not using any assistive device today  Skin: Negative for itching and rash.  Neurological: Negative for dizziness, loss of consciousness and headaches.  Psychiatric/Behavioral: Positive for memory loss. Negative for depression. The patient is not nervous/anxious and does not have insomnia.     Health Maintenance  Topic Date Due  . MAMMOGRAM  11/22/2017  . TETANUS/TDAP  11/26/2022  . INFLUENZA VACCINE  Completed  . DEXA SCAN  Completed  . PNA vac Low Risk Adult  Completed    Physical Exam: Vitals:   12/14/17 1015  BP: (!) 148/78  Pulse: (!) 57  Temp: 97.8 F (36.6 C)  TempSrc: Oral  SpO2: 96%  Weight: 107 lb (48.5 kg)  Height: 5\' 6"  (1.676 m)   Body mass index is 17.27 kg/m. Physical Exam  Constitutional: No distress.  Thin Salazar, nad  HENT:  Head: Normocephalic and atraumatic.  Eyes:  glasses  Cardiovascular: Normal rate, regular rhythm, normal heart sounds and intact distal pulses.  Pulmonary/Chest: Effort normal and breath sounds normal. No respiratory distress.  Abdominal: Bowel sounds are normal.  Musculoskeletal:  Very unsteady gait, having to hold onto others  Neurological: She is alert. No cranial nerve deficit. Coordination normal.  Skin: Skin is warm and dry.  Psychiatric: She has a normal mood and affect.  Very short term memory--less than a minute at times before repeating questions    Labs reviewed: Basic Metabolic Panel: Recent Labs    06/11/17 1100 11/04/17 1441  11/18/17 0955  NA 141 138 139  K 5.0 5.2 5.1  CL 107 105 103  CO2 28 25 27   GLUCOSE 88 84 61*  BUN 39* 32* 33*  CREATININE 1.73* 1.78* 1.98*  CALCIUM 9.6 8.7 9.1   Liver Function Tests: Recent Labs    06/11/17 1100  AST 12  ALT 12  BILITOT 0.6  PROT 6.8   No results for input(s): LIPASE, AMYLASE in the last 8760 hours. No results for input(s): AMMONIA in the last 8760 hours. CBC: Recent Labs    06/11/17 1100  WBC 4.9  NEUTROABS 3,009  HGB 15.1  HCT 44.2  MCV 93.1  PLT 166   Lipid Panel: Recent Labs    06/11/17 1100  CHOL 199  HDL 68  LDLCALC 114*  TRIG 80  CHOLHDL 2.9   Lab Results  Component Value Date   HGBA1C 5.7 (H) 06/11/2017    Assessment/Plan 1. Essential hypertension - bp still high - will increase her amlodipine to 5mg  from 2.5mg   -cont bystolic 5mg  daily which has helped -she's off ARB due to worsening renal function - amLODipine (NORVASC) 2.5 MG tablet; Take 2 tablets (5 mg total) by mouth daily.  Dispense: 30 tablet; Refill: 1 - Basic metabolic panel  2. Late onset Alzheimer's disease without behavioral disturbance [facility]) -her daughter mentioned having something set up to evaluate her functional needs already -we discussed that she should not be left alone and needs either CNA assistance/companionship or visit to [facility] for stimulation when Wilmer is out doing yardwork   3. Chronic kidney disease (CKD), stage IV (severe) (HCC) -improved off arb, f/u bmp - Basic metabolic panel  4. Underweight -encouraged boost supplement and assistance with meals to ensure she eats adequately, encourage foods she enjoys even if high calorie/not typically healthiest choices to maintain some nutrition  5. Adult BMI <19 kg/sq m -remains very low, counseled on this  6. Osteopenia of multiple sites - somehow, she's not on vitamin D again, though I'm sure I ordered this or recommended it several times now -her daughter wrote it  down and plans to make sure they get it this time and will put it in the pillbox - Cholecalciferol (VITAMIN D3) 50 MCG (2000 UT) capsule; Take 1 capsule (2,000 Units total) by mouth daily.  Dispense: 30 capsule; Refill: 5  7. Senile purpura (HCC) -continues to bruise easily.  Labs/tests ordered:   Orders Placed This Encounter  Procedures  . Basic metabolic panel    Order Specific Question:   Has the patient fasted?    Answer:   Yes    Next appt:  2 wks for bp recheck  Jalayah Gutridge L. Diontay Rosencrans, D.O. [facility] [address], [place] [address] Cell Phone (Mon-Fri 8am-5pm):  [phone] On Call:  [phone] & follow prompts after 5pm & weekends Office Phone:  [phone] Office Fax:  [phone]

## 2017-12-14 NOTE — Patient Instructions (Addendum)
Lindsey Salazar , Thank you for taking time to come for your Medicare Wellness Visit. I appreciate your ongoing commitment to your health goals. Please review the following plan we discussed and let me know if I can assist you in the future.   Screening recommendations/referrals: Colonoscopy excluded, over age 82 Mammogram excluded, over age 1 Bone Density up to date Recommended yearly ophthalmology/optometry visit for glaucoma screening and checkup Recommended yearly dental visit for hygiene and checkup  Vaccinations: Influenza vaccine up to date Pneumococcal vaccine up to date, completed Tdap vaccine up to date, due 11/26/2022 Shingles vaccine due, ordered to pharmacy    Advanced directives: in chart  Conditions/risks identified: none  Next appointment: [name], RN 12/17/2018 @ 10am   Preventive Care 65 Years and Older, Female Preventive care refers to lifestyle choices and visits with your health care provider that can promote health and wellness. What does preventive care include?  A yearly physical exam. This is also called an annual well check.  Dental exams once or twice a year.  Routine eye exams. Ask your health care provider how often you should have your eyes checked.  Personal lifestyle choices, including:  Daily care of your teeth and gums.  Regular physical activity.  Eating a healthy diet.  Avoiding tobacco and drug use.  Limiting alcohol use.  Practicing safe sex.  Taking low-dose aspirin every day.  Taking vitamin and mineral supplements as recommended by your health care provider. What happens during an annual well check? The services and screenings done by your health care provider during your annual well check will depend on your age, overall health, lifestyle risk factors, and family history of disease. Counseling  Your health care provider may ask you questions about your:  Alcohol use.  Tobacco use.  Drug use.  Emotional  well-being.  Home and relationship well-being.  Sexual activity.  Eating habits.  History of falls.  Memory and ability to understand (cognition).  Work and work Statistician.  Reproductive health. Screening  You may have the following tests or measurements:  Height, weight, and BMI.  Blood pressure.  Lipid and cholesterol levels. These may be checked every 5 years, or more frequently if you are over 67 years old.  Skin check.  Lung cancer screening. You may have this screening every year starting at age 56 if you have a 30-pack-year history of smoking and currently smoke or have quit within the past 15 years.  Fecal occult blood test (FOBT) of the stool. You may have this test every year starting at age 28.  Flexible sigmoidoscopy or colonoscopy. You may have a sigmoidoscopy every 5 years or a colonoscopy every 10 years starting at age 51.  Hepatitis C blood test.  Hepatitis B blood test.  Sexually transmitted disease (STD) testing.  Diabetes screening. This is done by checking your blood sugar (glucose) after you have not eaten for a while (fasting). You may have this done every 1-3 years.  Bone density scan. This is done to screen for osteoporosis. You may have this done starting at age 30.  Mammogram. This may be done every 1-2 years. Talk to your health care provider about how often you should have regular mammograms. Talk with your health care provider about your test results, treatment options, and if necessary, the need for more tests. Vaccines  Your health care provider may recommend certain vaccines, such as:  Influenza vaccine. This is recommended every year.  Tetanus, diphtheria, and acellular pertussis (Tdap, Td) vaccine.  You may need a Td booster every 10 years.  Zoster vaccine. You may need this after age 22.  Pneumococcal 13-valent conjugate (PCV13) vaccine. One dose is recommended after age 76.  Pneumococcal polysaccharide (PPSV23) vaccine. One  dose is recommended after age 69. Talk to your health care provider about which screenings and vaccines you need and how often you need them. This information is not intended to replace advice given to you by your health care provider. Make sure you discuss any questions you have with your health care provider. Document Released: 02/02/2015 Document Revised: 09/26/2015 Document Reviewed: 11/07/2014 Elsevier Interactive Patient Education  2017 [place] Prevention in the Home Falls can cause injuries. They can happen to people of all ages. There are many things you can do to make your home safe and to help prevent falls. What can I do on the outside of my home?  Regularly fix the edges of walkways and driveways and fix any cracks.  Remove anything that might make you trip as you walk through a door, such as a raised step or threshold.  Trim any bushes or trees on the path to your home.  Use bright outdoor lighting.  Clear any walking paths of anything that might make someone trip, such as rocks or tools.  Regularly check to see if handrails are loose or broken. Make sure that both sides of any steps have handrails.  Any raised decks and porches should have guardrails on the edges.  Have any leaves, snow, or ice cleared regularly.  Use sand or salt on walking paths during winter.  Clean up any spills in your garage right away. This includes oil or grease spills. What can I do in the bathroom?  Use night lights.  Install grab bars by the toilet and in the tub and shower. Do not use towel bars as grab bars.  Use non-skid mats or decals in the tub or shower.  If you need to sit down in the shower, use a plastic, non-slip stool.  Keep the floor dry. Clean up any water that spills on the floor as soon as it happens.  Remove soap buildup in the tub or shower regularly.  Attach bath mats securely with double-sided non-slip rug tape.  Do not have throw rugs and other  things on the floor that can make you trip. What can I do in the bedroom?  Use night lights.  Make sure that you have a light by your bed that is easy to reach.  Do not use any sheets or blankets that are too big for your bed. They should not hang down onto the floor.  Have a firm chair that has side arms. You can use this for support while you get dressed.  Do not have throw rugs and other things on the floor that can make you trip. What can I do in the kitchen?  Clean up any spills right away.  Avoid walking on wet floors.  Keep items that you use a lot in easy-to-reach places.  If you need to reach something above you, use a strong step stool that has a grab bar.  Keep electrical cords out of the way.  Do not use floor polish or wax that makes floors slippery. If you must use wax, use non-skid floor wax.  Do not have throw rugs and other things on the floor that can make you trip. What can I do with my stairs?  Do not leave any  items on the stairs.  Make sure that there are handrails on both sides of the stairs and use them. Fix handrails that are broken or loose. Make sure that handrails are as long as the stairways.  Check any carpeting to make sure that it is firmly attached to the stairs. Fix any carpet that is loose or worn.  Avoid having throw rugs at the top or bottom of the stairs. If you do have throw rugs, attach them to the floor with carpet tape.  Make sure that you have a light switch at the top of the stairs and the bottom of the stairs. If you do not have them, ask someone to add them for you. What else can I do to help prevent falls?  Wear shoes that:  Do not have high heels.  Have rubber bottoms.  Are comfortable and fit you well.  Are closed at the toe. Do not wear sandals.  If you use a stepladder:  Make sure that it is fully opened. Do not climb a closed stepladder.  Make sure that both sides of the stepladder are locked into place.  Ask  someone to hold it for you, if possible.  Clearly mark and make sure that you can see:  Any grab bars or handrails.  First and last steps.  Where the edge of each step is.  Use tools that help you move around (mobility aids) if they are needed. These include:  Canes.  Walkers.  Scooters.  Crutches.  Turn on the lights when you go into a dark area. Replace any light bulbs as soon as they burn out.  Set up your furniture so you have a clear path. Avoid moving your furniture around.  If any of your floors are uneven, fix them.  If there are any pets around you, be aware of where they are.  Review your medicines with your doctor. Some medicines can make you feel dizzy. This can increase your chance of falling. Ask your doctor what other things that you can do to help prevent falls. This information is not intended to replace advice given to you by your health care provider. Make sure you discuss any questions you have with your health care provider. Document Released: 11/02/2008 Document Revised: 06/14/2015 Document Reviewed: 02/10/2014 Elsevier Interactive Patient Education  2017 [employer].

## 2017-12-15 ENCOUNTER — Other Ambulatory Visit: Payer: Self-pay | Admitting: Internal Medicine

## 2017-12-15 LAB — BASIC METABOLIC PANEL
BUN/Creatinine Ratio: 20 (calc) (ref 6–22)
BUN: 35 mg/dL — ABNORMAL HIGH (ref 7–25)
CO2: 27 mmol/L (ref 20–32)
Calcium: 9.9 mg/dL (ref 8.6–10.4)
Chloride: 106 mmol/L (ref 98–110)
Creat: 1.76 mg/dL — ABNORMAL HIGH (ref 0.60–0.88)
Glucose, Bld: 74 mg/dL (ref 65–139)
Potassium: 5.1 mmol/L (ref 3.5–5.3)
Sodium: 142 mmol/L (ref 135–146)

## 2017-12-18 ENCOUNTER — Other Ambulatory Visit: Payer: Self-pay | Admitting: Internal Medicine

## 2017-12-19 MED ORDER — BOOST BREEZE PO LIQD: 1.0000 | Freq: Every day | ORAL | 5 refills | Status: DC

## 2017-12-21 ENCOUNTER — Other Ambulatory Visit: Payer: Self-pay | Admitting: *Deleted

## 2017-12-21 MED ORDER — NEBIVOLOL HCL 5 MG PO TABS: 5.0000 mg | ORAL_TABLET | Freq: Every day | ORAL | 1 refills | Status: DC

## 2017-12-26 ENCOUNTER — Other Ambulatory Visit: Payer: Self-pay | Admitting: Nurse Practitioner

## 2017-12-26 DIAGNOSIS — I10 Essential (primary) hypertension: Secondary | ICD-10-CM

## 2017-12-28 ENCOUNTER — Other Ambulatory Visit: Payer: Self-pay | Admitting: *Deleted

## 2017-12-28 DIAGNOSIS — I10 Essential (primary) hypertension: Secondary | ICD-10-CM

## 2017-12-28 MED ORDER — AMLODIPINE BESYLATE 5 MG PO TABS: ORAL_TABLET | ORAL | 0 refills | Status: DC

## 2017-12-28 NOTE — Telephone Encounter (Signed)
Refill request from [facility] stated that patient stating her Amlodipine was to INCREASED to 5mg  daily.   Reviewed last OV note dated 12/14/2017 and Dr. Mariea Clonts made the changes in OV note.   Medication list updated and faxed Rx.

## 2018-01-30 ENCOUNTER — Other Ambulatory Visit: Payer: Self-pay | Admitting: Internal Medicine

## 2018-02-27 ENCOUNTER — Other Ambulatory Visit: Payer: Self-pay | Admitting: Internal Medicine

## 2018-03-22 ENCOUNTER — Encounter: Payer: Self-pay | Admitting: Internal Medicine

## 2018-03-22 ENCOUNTER — Ambulatory Visit: Payer: Medicare Other | Admitting: Internal Medicine

## 2018-03-22 VITALS — BP 136/70 | HR 49 | Temp 97.8°F | Ht 66.0 in | Wt 110.0 lb

## 2018-03-22 DIAGNOSIS — G301 Alzheimer's disease with late onset: Secondary | ICD-10-CM | POA: Diagnosis not present

## 2018-03-22 DIAGNOSIS — R636 Underweight: Secondary | ICD-10-CM | POA: Diagnosis not present

## 2018-03-22 DIAGNOSIS — F0280 Dementia in other diseases classified elsewhere without behavioral disturbance: Secondary | ICD-10-CM

## 2018-03-22 DIAGNOSIS — R739 Hyperglycemia, unspecified: Secondary | ICD-10-CM

## 2018-03-22 DIAGNOSIS — N184 Chronic kidney disease, stage 4 (severe): Secondary | ICD-10-CM

## 2018-03-22 DIAGNOSIS — I10 Essential (primary) hypertension: Secondary | ICD-10-CM | POA: Diagnosis not present

## 2018-03-22 DIAGNOSIS — Z681 Body mass index (BMI) 19 or less, adult: Secondary | ICD-10-CM | POA: Diagnosis not present

## 2018-03-22 NOTE — Patient Instructions (Signed)
I recommend the Safe Return program through the [employer]. If Lindsey Salazar is not home, I recommend someone else be with you.

## 2018-03-22 NOTE — Progress Notes (Signed)
Location:  [facility] clinic Provider:  Reynol Arnone L. Mariea Clonts, D.O., C.M.D.  Code Status: DNR Goals of Care:  Advanced Directives 12/14/2017  Does Patient Have a Medical Advance Directive? Yes  Type of Paramedic of [place];Living will  Does patient want to make changes to medical advance directive? No - Patient declined  Copy of [place] in Chart? Yes - validated most recent copy scanned in chart (See row information)     Chief Complaint  Patient presents with  . Medical Management of Chronic Issues    33mth follow-up    HPI: Patient is a 85 y.o. female seen today for medical management of chronic diseases--here with her daughter and her husband today.    Doing well since last visit.  Husband forgot bp log.  No very high or low bps.  No headaches, dizziness, chest pain or palpitations.    She's been eating well.  Sleeping 7-8 hrs per night.  Bowels are moving well.  No falls, no mobility issues.    Short-term memory remains poor.  Wants to go home when already home.  Not at the same time always.  Family has no safety concerns.  Has not wandered.  She is to be wearing her necklace.  Wilmer is with her 99% of the time.  Their alarms system does beep when doors are opened, but it's unclear if her husband is able to hear that.  They have cameras also.   Pt's son and daughter are both HCPOA now.  Her daughter will bring [place] the up-to-date document.    Given info on safe return program thru Alzheimer's association.  Some family has been insisting they get more help at home.  Children are there in and out daily.   Wilmer agrees and so do children that someone should be with her when he's out and about.    Past Medical History:  Diagnosis Date  . Acute bronchitis   . Benign neoplasm of uterus, part unspecified   . Chronic ischemic heart disease, unspecified   . Chronic kidney disease, stage II (mild)   . Cough   . Encounter for long-term (current)  use of other medications   . Fall 01/30/2016  . Ganglion, unspecified   . Hearing loss   . Hyperlipidemia   . Hypertension   . Impacted cerumen   . Light-headed feeling 09/25/2015  . Macular degeneration (senile) of retina, unspecified   . Mitral valve disorders(424.0)   . Nonspecific abnormal electrocardiogram (ECG) (EKG)   . Nonspecific abnormal electrocardiogram (ECG) (EKG)   . Osteoporosis, unspecified   . Other abnormal blood chemistry   . Other emphysema ([place])   . Other malaise and fatigue   . Palpitations   . Senile cataract, unspecified   . Senile purpura ([place]) 06/06/2015  . Thrombocytopenia, unspecified ([place])   . Unspecified hearing loss     Past Surgical History:  Procedure Laterality Date  . KNEE ARTHROSCOPY     right    Allergies  Allergen Reactions  . Penicillins Nausea And Vomiting    Outpatient Encounter Medications as of 03/22/2018  Medication Sig  . acetaminophen (TYLENOL) 500 MG tablet Take 500 mg by mouth every 6 (six) hours as needed.  Marland Kitchen amLODipine (NORVASC) 5 MG tablet Take one tablet by mouth once daily for blood pressure  . aspirin 81 MG tablet Take 81 mg by mouth. Take one tablet every other day  . atorvastatin (LIPITOR) 10 MG tablet TAKE 1 TABLET  ONCE DAILY TO LOWER CHOLESTEROL.  . Cholecalciferol (VITAMIN D3) 50 MCG (2000 UT) capsule Take 1 capsule (2,000 Units total) by mouth daily.  Marland Kitchen donepezil (ARICEPT) 10 MG tablet TAKE ONE TABLET AT BEDTIME.  . memantine (NAMENDA XR) 28 MG CP24 24 hr capsule TAKE 1 CAPSULE DAILY.  . Multiple Vitamins-Minerals ([employer] SPECTRAVITE PO) Take 1 tablet by mouth daily.  . nebivolol (BYSTOLIC) 5 MG tablet Take 1 tablet (5 mg total) by mouth daily.  . Nutritional Supplements (FEEDING SUPPLEMENT, BOOST BREEZE,) LIQD Take 1 Can by mouth daily.  Marland Kitchen Specialty Vitamins Products (LIPOTRIAD VISION SUPPORT PO) Take 1 tablet by mouth daily.   No facility-administered encounter medications on file as of 03/22/2018.     Review of  Systems:  Review of Systems  Constitutional: Negative for chills, fever, malaise/fatigue and weight loss.  HENT: Positive for hearing loss. Negative for congestion.   Eyes: Negative for blurred vision.       Glasses  Respiratory: Negative for cough and shortness of breath.   Cardiovascular: Negative for chest pain, palpitations and leg swelling.  Gastrointestinal: Negative for abdominal pain, blood in stool, constipation, diarrhea and melena.  Genitourinary: Positive for frequency. Negative for dysuria and urgency.  Musculoskeletal: Negative for back pain, falls and joint pain.  Skin: Negative for itching and rash.  Neurological: Negative for dizziness, loss of consciousness and weakness.  Endo/Heme/Allergies: Bruises/bleeds easily.  Psychiatric/Behavioral: Positive for memory loss. Negative for depression. The patient is not nervous/anxious and does not have insomnia.     Health Maintenance  Topic Date Due  . MAMMOGRAM  11/22/2017  . TETANUS/TDAP  06/22/2024  . INFLUENZA VACCINE  Completed  . DEXA SCAN  Completed  . PNA vac Low Risk Adult  Completed    Physical Exam: Vitals:   03/22/18 0844  BP: 136/70  Pulse: (!) 49  Temp: 97.8 F (36.6 C)  TempSrc: Oral  SpO2: 97%  Weight: 110 lb (49.9 kg)  Height: 5\' 6"  (1.676 m)   Body mass index is 17.75 kg/m. Physical Exam Vitals signs and nursing note reviewed.  Constitutional:      General: She is not in acute distress.    Appearance: She is not toxic-appearing.     Comments: Thin female  HENT:     Head: Normocephalic and atraumatic.  Eyes:     Comments: Glasses   Cardiovascular:     Rate and Rhythm: Regular rhythm. Bradycardia present.     Pulses: Normal pulses.     Heart sounds: Normal heart sounds.  Pulmonary:     Effort: Pulmonary effort is normal.     Breath sounds: Normal breath sounds.  Musculoskeletal: Normal range of motion.  Skin:    General: Skin is warm and dry.     Capillary Refill: Capillary refill  takes less than 2 seconds.  Neurological:     General: No focal deficit present.     Mental Status: She is alert.     Comments: Oriented to person, place, not time and does not recall her breakfast from this morning  Psychiatric:        Mood and Affect: Mood normal.     Labs reviewed: Basic Metabolic Panel: Recent Labs    11/04/17 1441 11/18/17 0955 12/14/17 1101  NA 138 139 142  K 5.2 5.1 5.1  CL 105 103 106  CO2 25 27 27   GLUCOSE 84 61* 74  BUN 32* 33* 35*  CREATININE 1.78* 1.98* 1.76*  CALCIUM 8.7 9.1 9.9  Liver Function Tests: Recent Labs    06/11/17 1100  AST 12  ALT 12  BILITOT 0.6  PROT 6.8   No results for input(s): LIPASE, AMYLASE in the last 8760 hours. No results for input(s): AMMONIA in the last 8760 hours. CBC: Recent Labs    06/11/17 1100  WBC 4.9  NEUTROABS 3,009  HGB 15.1  HCT 44.2  MCV 93.1  PLT 166   Lipid Panel: Recent Labs    06/11/17 1100  CHOL 199  HDL 68  LDLCALC 114*  TRIG 80  CHOLHDL 2.9   Lab Results  Component Value Date   HGBA1C 5.7 (H) 06/11/2017    Assessment/Plan 1. Underweight -cont to encourage po intake, supplement shakes, is maintaining and intake is quite good, but high risk for weight loss  2. Body mass index (BMI) of 19 or less in adult -cont to encourage po intake -has been stable   3. Essential hypertension -bp well controlled now w/o s/s or high or low bps and reasonable readings at home per pt's husband - COMPLETE METABOLIC PANEL WITH GFR  4. Late onset Alzheimer's disease without behavioral disturbance ([place]) -progressing, recently forgetting she lives in her home and wanting to go home to get clothing -counseled on safe return program, use of her life alert button and not being left at home alone even for Wilmer to go outside  5. Chronic kidney disease (CKD), stage IV (severe) (HCC) - Avoid nephrotoxic agents like nsaids, dose adjust renally excreted meds, hydrate. - COMPLETE METABOLIC PANEL  WITH GFR - CBC with Differential/Platelet  6. Hyperglycemia - f/u labs: - Hemoglobin A1c  Labs/tests ordered:   Orders Placed This Encounter  Procedures  . COMPLETE METABOLIC PANEL WITH GFR  . CBC with Differential/Platelet  . Hemoglobin A1c   Next appt:  4 mos med mgt, fasting labs before  Kamali Sakata L. Delesia Martinek, D.O. [facility] [address], [place] [address] Cell Phone (Mon-Fri 8am-5pm):  [phone] On Call:  [phone] & follow prompts after 5pm & weekends Office Phone:  [phone] Office Fax:  [phone]

## 2018-03-23 ENCOUNTER — Encounter: Payer: Self-pay | Admitting: *Deleted

## 2018-03-23 LAB — COMPLETE METABOLIC PANEL WITH GFR
AG Ratio: 1.3 (calc) (ref 1.0–2.5)
ALT: 10 U/L (ref 6–29)
AST: 15 U/L (ref 10–35)
Albumin: 3.9 g/dL (ref 3.6–5.1)
Alkaline phosphatase (APISO): 81 U/L (ref 37–153)
BUN/Creatinine Ratio: 20 (calc) (ref 6–22)
BUN: 35 mg/dL — ABNORMAL HIGH (ref 7–25)
CO2: 28 mmol/L (ref 20–32)
Calcium: 10.2 mg/dL (ref 8.6–10.4)
Chloride: 104 mmol/L (ref 98–110)
Creat: 1.73 mg/dL — ABNORMAL HIGH (ref 0.60–0.88)
GFR, Est African American: 31 mL/min/{1.73_m2} — ABNORMAL LOW (ref >=60)
GFR, Est Non African American: 27 mL/min/{1.73_m2} — ABNORMAL LOW (ref >=60)
Globulin: 3.1 g/dL (calc) (ref 1.9–3.7)
Glucose, Bld: 51 mg/dL — ABNORMAL LOW (ref 65–139)
Potassium: 5.2 mmol/L (ref 3.5–5.3)
Sodium: 140 mmol/L (ref 135–146)
Total Bilirubin: 0.6 mg/dL (ref 0.2–1.2)
Total Protein: 7 g/dL (ref 6.1–8.1)

## 2018-03-23 LAB — CBC WITH DIFFERENTIAL/PLATELET
Absolute Monocytes: 696 cells/uL (ref 200–950)
Basophils Absolute: 52 cells/uL (ref 0–200)
Basophils Relative: 0.8 %
Eosinophils Absolute: 117 cells/uL (ref 15–500)
Eosinophils Relative: 1.8 %
HCT: 43.6 % (ref 35.0–45.0)
Hemoglobin: 14.8 g/dL (ref 11.7–15.5)
Lymphs Abs: 1326 cells/uL (ref 850–3900)
MCH: 32.2 pg (ref 27.0–33.0)
MCHC: 33.9 g/dL (ref 32.0–36.0)
MCV: 94.8 fL (ref 80.0–100.0)
MPV: 11.5 fL (ref 7.5–12.5)
Monocytes Relative: 10.7 %
Neutro Abs: 4310 cells/uL (ref 1500–7800)
Neutrophils Relative %: 66.3 %
Platelets: 170 10*3/uL (ref 140–400)
RBC: 4.6 10*6/uL (ref 3.80–5.10)
RDW: 12.1 % (ref 11.0–15.0)
Total Lymphocyte: 20.4 %
WBC: 6.5 10*3/uL (ref 3.8–10.8)

## 2018-03-23 LAB — HEMOGLOBIN A1C
Hgb A1c MFr Bld: 5.5 % of total Hgb (ref <5.7)
Mean Plasma Glucose: 111 (calc)
eAG (mmol/L): 6.2 (calc)

## 2018-03-25 ENCOUNTER — Other Ambulatory Visit: Payer: Self-pay | Admitting: Internal Medicine

## 2018-03-25 DIAGNOSIS — I10 Essential (primary) hypertension: Secondary | ICD-10-CM

## 2018-04-08 ENCOUNTER — Other Ambulatory Visit: Payer: Self-pay | Admitting: Internal Medicine

## 2018-04-08 DIAGNOSIS — I10 Essential (primary) hypertension: Secondary | ICD-10-CM

## 2018-05-21 ENCOUNTER — Emergency Department (HOSPITAL_COMMUNITY): Payer: Medicare Other

## 2018-05-21 ENCOUNTER — Other Ambulatory Visit: Payer: Self-pay

## 2018-05-21 ENCOUNTER — Encounter (HOSPITAL_COMMUNITY): Payer: Self-pay

## 2018-05-21 ENCOUNTER — Emergency Department (HOSPITAL_COMMUNITY)
Admission: EM | Admit: 2018-05-21 | Discharge: 2018-05-22 | Disposition: A | Discharge status: Home / Self Care | Payer: Medicare Other | Attending: Emergency Medicine | Admitting: Emergency Medicine

## 2018-05-21 DIAGNOSIS — I10 Essential (primary) hypertension: Secondary | ICD-10-CM | POA: Insufficient documentation

## 2018-05-21 DIAGNOSIS — R350 Frequency of micturition: Secondary | ICD-10-CM | POA: Insufficient documentation

## 2018-05-21 DIAGNOSIS — R300 Dysuria: Secondary | ICD-10-CM | POA: Diagnosis not present

## 2018-05-21 DIAGNOSIS — Z79899 Other long term (current) drug therapy: Secondary | ICD-10-CM | POA: Insufficient documentation

## 2018-05-21 DIAGNOSIS — F0390 Unspecified dementia without behavioral disturbance: Secondary | ICD-10-CM | POA: Insufficient documentation

## 2018-05-21 DIAGNOSIS — Z7982 Long term (current) use of aspirin: Secondary | ICD-10-CM | POA: Insufficient documentation

## 2018-05-21 DIAGNOSIS — R531 Weakness: Secondary | ICD-10-CM | POA: Diagnosis not present

## 2018-05-21 DIAGNOSIS — R109 Unspecified abdominal pain: Secondary | ICD-10-CM | POA: Diagnosis not present

## 2018-05-21 LAB — URINALYSIS, ROUTINE W REFLEX MICROSCOPIC
Bacteria, UA: NONE SEEN
Bilirubin Urine: NEGATIVE
Glucose, UA: NEGATIVE mg/dL
Hgb urine dipstick: NEGATIVE
Ketones, ur: 5 mg/dL — AB
Leukocytes,Ua: NEGATIVE
Nitrite: NEGATIVE
Protein, ur: 100 mg/dL — AB
Specific Gravity, Urine: 1.013 (ref 1.005–1.030)
pH: 7 (ref 5.0–8.0)

## 2018-05-21 LAB — COMPREHENSIVE METABOLIC PANEL
ALT: 15 U/L (ref 0–44)
AST: 20 U/L (ref 15–41)
Albumin: 4 g/dL (ref 3.5–5.0)
Alkaline Phosphatase: 76 U/L (ref 38–126)
Anion gap: 14 (ref 5–15)
BUN: 32 mg/dL — ABNORMAL HIGH (ref 8–23)
CO2: 27 mmol/L (ref 22–32)
Calcium: 10.1 mg/dL (ref 8.9–10.3)
Chloride: 97 mmol/L — ABNORMAL LOW (ref 98–111)
Creatinine, Ser: 1.71 mg/dL — ABNORMAL HIGH (ref 0.44–1.00)
GFR calc Af Amer: 32 mL/min — ABNORMAL LOW (ref >60)
GFR calc non Af Amer: 27 mL/min — ABNORMAL LOW (ref >60)
Glucose, Bld: 156 mg/dL — ABNORMAL HIGH (ref 70–99)
Potassium: 4.7 mmol/L (ref 3.5–5.1)
Sodium: 138 mmol/L (ref 135–145)
Total Bilirubin: 1.1 mg/dL (ref 0.3–1.2)
Total Protein: 7.4 g/dL (ref 6.5–8.1)

## 2018-05-21 LAB — CBC
HCT: 48.3 % — ABNORMAL HIGH (ref 36.0–46.0)
Hemoglobin: 15.7 g/dL — ABNORMAL HIGH (ref 12.0–15.0)
MCH: 32.2 pg (ref 26.0–34.0)
MCHC: 32.5 g/dL (ref 30.0–36.0)
MCV: 99.2 fL (ref 80.0–100.0)
Platelets: 148 10*3/uL — ABNORMAL LOW (ref 150–400)
RBC: 4.87 MIL/uL (ref 3.87–5.11)
RDW: 12.4 % (ref 11.5–15.5)
WBC: 13.2 10*3/uL — ABNORMAL HIGH (ref 4.0–10.5)
nRBC: 0 % (ref 0.0–0.2)

## 2018-05-21 LAB — LIPASE, BLOOD: Lipase: 63 U/L — ABNORMAL HIGH (ref 11–51)

## 2018-05-21 LAB — CBG MONITORING, ED: Glucose-Capillary: 128 mg/dL — ABNORMAL HIGH (ref 70–99)

## 2018-05-21 MED ORDER — AMLODIPINE BESYLATE 5 MG PO TABS
5.0000 mg | ORAL_TABLET | Freq: Once | ORAL | Status: AC
  Administered 2018-05-21: 5 mg via ORAL
  Filled 2018-05-21: qty 1

## 2018-05-21 MED ORDER — SODIUM CHLORIDE 0.9% FLUSH: 3.0000 mL | Freq: Once | INTRAVENOUS | Status: DC

## 2018-05-21 NOTE — ED Provider Notes (Signed)
[facility] EMERGENCY DEPARTMENT Provider Note   CSN: [id number] Arrival date & time: 05/21/18  1818    History   Chief Complaint Chief Complaint  Patient presents with   Weakness   Urinary Frequency    HPI Lindsey Salazar is a 85 y.o. female.     HPI 85 year old woman with a past medical history of dementia presents to the emergency department for fatigue and feeling unwell over the last several hours.  Son took the patient to the fire department to have her blood pressure checked and it was 616 systolic.  She does take a beta-blocker and amlodipine every day and the son states that the pill buddy was emptied this morning so he believes the patient was taking her medicine correctly.  She lives with her 64 year old husband and has not reported any recent falls but did have a fall 2 weeks ago.  Patient was complaining of mild abdominal pain but currently has no abdominal pain.  Notes that she has had some dysuria over the last several days.  No fevers at home.  No cough or congestion.  Has been taking all of her medication as directed.  Has not noticed any diarrhea or constipation and appetite has otherwise been normal. Past Medical History:  Diagnosis Date   Acute bronchitis    Benign neoplasm of uterus, part unspecified    Chronic ischemic heart disease, unspecified    Chronic kidney disease, stage II (mild)    Cough    Encounter for long-term (current) use of other medications    Fall 01/30/2016   Ganglion, unspecified    Hearing loss    Hyperlipidemia    Hypertension    Impacted cerumen    Light-headed feeling 09/25/2015   Macular degeneration (senile) of retina, unspecified    Mitral valve disorders(424.0)    Nonspecific abnormal electrocardiogram (ECG) (EKG)    Nonspecific abnormal electrocardiogram (ECG) (EKG)    Osteoporosis, unspecified    Other abnormal blood chemistry    Other emphysema (HCC)    Other malaise and  fatigue    Palpitations    Senile cataract, unspecified    Senile purpura ([place]) 06/06/2015   Thrombocytopenia, unspecified ([place])    Unspecified hearing loss     Patient Active Problem List   Diagnosis Date Noted   Osteopenia 12/14/2017   Adult BMI <19 kg/sq m 12/14/2017   Underweight 12/14/2017   Late onset Alzheimer's disease without behavioral disturbance ([place]) 10/09/2016   Chronic kidney disease (CKD), stage IV (severe) ([place]) 10/09/2016   Fracture, sacrum/coccyx ([place]) 01/30/2016   Syncope 01/30/2016   Light-headed feeling 09/25/2015   Senile purpura ([place]) 06/06/2015   Palpitations 05/30/2014   Hyperglycemia 11/23/2012   Thrombocytopenia, unspecified ([place]) 05/18/2012   Mitral valve disorder 05/18/2012   Senile cataract, unspecified 05/18/2012   Abnormal EKG 04/15/2011   HTN (hypertension) 04/15/2011   Hyperlipidemia     Past Surgical History:  Procedure Laterality Date   KNEE ARTHROSCOPY     right     OB History   No obstetric history on file.      Home Medications    Prior to Admission medications   Medication Sig Start Date End Date Taking? Authorizing Provider  acetaminophen (TYLENOL) 500 MG tablet Take 500 mg by mouth every 6 (six) hours as needed.    [provider]  amLODipine (NORVASC) 5 MG tablet TAKE 1 TABLET ONCE DAILY FOR BLOOD PRESSURE. 04/08/18   Reed, Tiffany L, DO  aspirin 81  MG tablet Take 81 mg by mouth. Take one tablet every other day    [provider]  atorvastatin (LIPITOR) 10 MG tablet TAKE 1 TABLET ONCE DAILY TO LOWER CHOLESTEROL. 12/14/17   Reed, Tiffany L, DO  Cholecalciferol (VITAMIN D3) 50 MCG (2000 UT) capsule Take 1 capsule (2,000 Units total) by mouth daily. 12/14/17   Reed, Tiffany L, DO  donepezil (ARICEPT) 10 MG tablet TAKE ONE TABLET AT BEDTIME. 04/08/18   Reed, Tiffany L, DO  memantine (NAMENDA XR) 28 MG CP24 24 hr capsule TAKE 1 CAPSULE DAILY. 04/08/18   Reed, Tiffany L, DO  Multiple  Vitamins-Minerals ([employer] SPECTRAVITE PO) Take 1 tablet by mouth daily.    [provider]  nebivolol (BYSTOLIC) 5 MG tablet Take 1 tablet (5 mg total) by mouth daily. 12/21/17   Reed, Tiffany L, DO  Nutritional Supplements (FEEDING SUPPLEMENT, BOOST BREEZE,) LIQD Take 1 Can by mouth daily. 12/19/17   Gayland Curry, DO  Specialty Vitamins Products (LIPOTRIAD VISION SUPPORT PO) Take 1 tablet by mouth daily.    [provider]    Family History Family History  Problem Relation Age of Onset   Stroke Brother    Cancer Sister        lung   Parkinsonism Father        Alzheimer's    Social History Social History   Tobacco Use   Smoking status: Never Smoker   Smokeless tobacco: Never Used  Substance Use Topics   Alcohol use: No   Drug use: No     Allergies   Penicillins   Review of Systems Review of Systems  Constitutional: Negative for activity change, chills, fatigue and fever.  HENT: Negative for ear pain and sore throat.   Eyes: Negative for pain and visual disturbance.  Respiratory: Negative for cough and shortness of breath.   Cardiovascular: Negative for chest pain and palpitations.  Gastrointestinal: Positive for abdominal pain. Negative for blood in stool, constipation, nausea and vomiting.  Endocrine: Negative for polyphagia and polyuria.  Genitourinary: Negative for dysuria, hematuria, vaginal discharge and vaginal pain.  Musculoskeletal: Negative for arthralgias and back pain.  Skin: Negative for color change and rash.  Neurological: Negative for seizures and syncope.  All other systems reviewed and are negative.    Physical Exam Updated Vital Signs BP (!) 195/88    Pulse (!) 45    Temp 97.7 F (36.5 C) (Oral)    Resp 16    SpO2 98%   Physical Exam Vitals signs and nursing note reviewed.  Constitutional:      General: She is not in acute distress.    Appearance: She is well-developed.  HENT:     Head: Normocephalic and atraumatic.      Nose: No congestion or rhinorrhea.     Mouth/Throat:     Pharynx: No oropharyngeal exudate or posterior oropharyngeal erythema.  Eyes:     Conjunctiva/sclera: Conjunctivae normal.  Neck:     Musculoskeletal: Neck supple.  Cardiovascular:     Rate and Rhythm: Regular rhythm. Bradycardia present.     Heart sounds: No murmur.  Pulmonary:     Effort: Pulmonary effort is normal. No respiratory distress.     Breath sounds: Normal breath sounds.  Abdominal:     General: Abdomen is flat. There is no distension.     Palpations: Abdomen is soft. There is no mass.     Tenderness: There is no abdominal tenderness. There is no right CVA tenderness, left  CVA tenderness or guarding.     Hernia: No hernia is present.  Musculoskeletal:        General: No deformity or signs of injury.     Right lower leg: No edema.     Left lower leg: No edema.  Skin:    General: Skin is warm and dry.  Neurological:     General: No focal deficit present.     Mental Status: She is alert. Mental status is at baseline.     Cranial Nerves: No cranial nerve deficit.      ED Treatments / Results  Labs (all labs ordered are listed, but only abnormal results are displayed) Labs Reviewed  CBC - Abnormal; Notable for the following components:      Result Value   WBC 13.2 (*)    Hemoglobin 15.7 (*)    HCT 48.3 (*)    Platelets 148 (*)    All other components within normal limits  COMPREHENSIVE METABOLIC PANEL - Abnormal; Notable for the following components:   Chloride 97 (*)    Glucose, Bld 156 (*)    BUN 32 (*)    Creatinine, Ser 1.71 (*)    GFR calc non Af Amer 27 (*)    GFR calc Af Amer 32 (*)    All other components within normal limits  LIPASE, BLOOD - Abnormal; Notable for the following components:   Lipase 63 (*)    All other components within normal limits  URINALYSIS, ROUTINE W REFLEX MICROSCOPIC  CBG MONITORING, ED    EKG None  Radiology No results found.  Procedures Procedures  (including critical care time)  Medications Ordered in ED Medications  sodium chloride flush (NS) 0.9 % injection 3 mL (has no administration in time range)     Initial Impression / Assessment and Plan / ED Course  I have reviewed the triage vital signs and the nursing notes.  Pertinent labs & imaging results that were available during my care of the patient were reviewed by me and considered in my medical decision making (see chart for details).         Patient presents to the emergency department for intermittent abdominal pain and dysuria.  UA shows no signs of infection.  Chest x-ray does not show any acute abnormalities.  Patient with a very benign physical examination and currently does not have any abdominal pain.  Blood pressure is elevated at 196 systolic but it appears she has chronic hypertension.  I gave her a dose of amlodipine at this time and will allow that to take effect overnight.  CT scan of the head was ordered in the setting of the nebulous history as the patient does live with her elderly husband and does not always provide the best history per the son.  The scan did not show any acute intracranial abnormalities and a CT scan of the abdomen pelvis was ordered and she has never had a previous scan and in the setting of new abdominal pain in a patient who otherwise never goes to the emergency department we felt it was prudent to rule out other etiologies especially given her dementia.  Abdominal CT did show some thickening of the stomach lining which is likely chronic gastritis but also possibly neoplastic.  This was related to the family who will follow up with her PCP for this and I do not feel that this represents her acute pain given that the pain she described was suprapubic in nature.  He continues  to be chest pain-free even multiple hours after being in the emergency department.  Patient is bradycardic and her EKG appears to be baseline.  Bradycardia has been documented  well at previous clinic visits and I do not believe that this is an acute new finding but needs current evaluation.  Patient has a slight leukocytosis but given that she otherwise appears well and is tolerating p.o. I believe she is amenable to outpatient treatment.  Family in agreement with discharge.  Blood pressure downtrending on discharge consistent with amlodipine given several hours ago. Final Clinical Impressions(s) / ED Diagnoses   Final diagnoses:  Generalized weakness  Urinary frequency    ED Discharge Orders    None       Andee Poles, MD 05/21/18 2349    Quintella Reichert, MD 05/22/18 1133

## 2018-05-21 NOTE — ED Triage Notes (Signed)
Pt bib son, pt has hx of dementia. Pt having weakness and frequent urination for the past couple days. Pt lives at home with husband, son came by to see the pt and stated she was fidgeting with her hands and had frequent urination, took her to the fire dept and her BP was 349Q systolic. Pt reports generalized weakness. Bradycardic in the 40s during triage. Pt a.o at this time.

## 2018-05-23 LAB — URINE CULTURE: Culture: <10000 — AB

## 2018-05-25 ENCOUNTER — Encounter: Payer: Self-pay | Admitting: Family

## 2018-05-25 ENCOUNTER — Other Ambulatory Visit: Payer: Self-pay

## 2018-05-25 ENCOUNTER — Ambulatory Visit (INDEPENDENT_AMBULATORY_CARE_PROVIDER_SITE_OTHER): Payer: Medicare Other | Admitting: Family

## 2018-05-25 VITALS — BP 122/77 | Temp 98.6°F

## 2018-05-25 DIAGNOSIS — I10 Essential (primary) hypertension: Secondary | ICD-10-CM

## 2018-05-25 DIAGNOSIS — R748 Abnormal levels of other serum enzymes: Secondary | ICD-10-CM

## 2018-05-25 DIAGNOSIS — D72829 Elevated white blood cell count, unspecified: Secondary | ICD-10-CM

## 2018-05-25 DIAGNOSIS — N184 Chronic kidney disease, stage 4 (severe): Secondary | ICD-10-CM

## 2018-05-25 NOTE — Progress Notes (Signed)
This service is provided via telemedicine  No vital signs collected/recorded due to the encounter was a telemedicine visit.   Location of patient (ex: home, work):  Home   Patient consents to a telephone visit:  Yes   Location of the provider (ex: office, home):  Office   Name of any referring provider:  Dr. Hollace Kinnier   Names of all persons participating in the telemedicine service and their role in the encounter:  Ruthell Rummage CMA, Bodin Gorka NP, Amanda Cockayne and daughter [name]   Time spent on call:  Ruthell Rummage CMA spent  7 Minutes with patients on phone.  .  Provider: Neveen Daponte FNP-C  Gayland Curry, DO  Patient Care Team: Gayland Curry, DO as PCP - General (Geriatric Medicine) Minus Breeding, MD as Consulting Physician (Cardiology) Stark Klein, MD as Consulting Physician (General Surgery) Netta Cedars, MD as Consulting Physician (Orthopedic Surgery) Richmond Campbell, MD as Consulting Physician (Gastroenterology) Nat Christen, MD as Attending Physician (Optometry) Hortencia Pilar, MD as Consulting Physician (Surgery) Madelon Lips, MD as Consulting Physician (Nephrology)  Extended Emergency Contact Information Primary Emergency Contact: [place] Phone: [phone] Mobile Phone: [phone] Relation: Daughter Secondary Emergency Contact: Jeremiah,Wilmer Address: [address]          [place], [place] Phone: [phone] Relation: Spouse  Goals of care: Advanced Directive information Advanced Directives 05/25/2018  Does Patient Have a Medical Advance Directive? Yes  Type of Paramedic of [place];Living will  Does patient want to make changes to medical advance directive? No - Patient declined  Copy of [place] in Chart? Yes - validated most recent copy scanned in chart (See row information)     Chief Complaint    Patient presents with   Hospitalization Follow-up    Follow up from ER visit 5/1-5/2 for high blood pressure, daughter states alh    HPI:  Pt is a 85 y.o. female seen today for an acute visit for follow up ER visit 05/21/2018 for elevated blood pressure.Patient's daughter provided additional information during visit.she states patient was feeling weak,"hot and cold " systolic blood pressure was in the 200's.she takes her blood pressure medication as directed.she also complained of abdominal pain.she had a chest X-ray done in the ED which showed no acute abnormalities.CT scan of the abdomen showed thickening of the stomach lining which was thought to be likely chronic gastritis but possible neoplastic.patient was told to follow up with PCP. EKG showed bradycardia at baseline for patient.Her urine analysis was negative and urine culture showed insignificant growth. Patient denies any acute issues during visit.Patient's daughter states patient doesn't not recall going to the hospital.she does have a significant history of dementia but she is very independent with her active of daily living walks regularly with husband.she states abdominal pain has resolved.Blood pressure since 05/22/2018 has been back to her baseline ranging in the 100's/60's to 120's/70's.No fever,chills,cough,signs or symptoms of URI or UTI noted. No recent fall episodes or weight loss.    Past Medical History:  Diagnosis Date   Acute bronchitis    Benign neoplasm of uterus, part unspecified    Chronic ischemic heart disease, unspecified    Chronic kidney disease, stage II (mild)    Cough    Encounter for long-term (current) use of other medications    Fall 01/30/2016   Ganglion, unspecified    Hearing loss    Hyperlipidemia  Hypertension    Impacted cerumen    Light-headed feeling 09/25/2015   Macular degeneration (senile) of retina, unspecified    Mitral valve disorders(424.0)    Nonspecific abnormal  electrocardiogram (ECG) (EKG)    Nonspecific abnormal electrocardiogram (ECG) (EKG)    Osteoporosis, unspecified    Other abnormal blood chemistry    Other emphysema (HCC)    Other malaise and fatigue    Palpitations    Senile cataract, unspecified    Senile purpura ([place]) 06/06/2015   Thrombocytopenia, unspecified ([place])    Unspecified hearing loss    Past Surgical History:  Procedure Laterality Date   KNEE ARTHROSCOPY     right    Allergies  Allergen Reactions   Penicillins Nausea And Vomiting    Outpatient Encounter Medications as of 05/25/2018  Medication Sig   amLODipine (NORVASC) 5 MG tablet TAKE 1 TABLET ONCE DAILY FOR BLOOD PRESSURE.   aspirin 81 MG tablet Take 81 mg by mouth every other day.    atorvastatin (LIPITOR) 10 MG tablet TAKE 1 TABLET ONCE DAILY TO LOWER CHOLESTEROL.   Cholecalciferol (VITAMIN D3) 50 MCG (2000 UT) capsule Take 1 capsule (2,000 Units total) by mouth daily.   donepezil (ARICEPT) 10 MG tablet TAKE ONE TABLET AT BEDTIME.   memantine (NAMENDA XR) 28 MG CP24 24 hr capsule TAKE 1 CAPSULE DAILY.   nebivolol (BYSTOLIC) 5 MG tablet Take 1 tablet (5 mg total) by mouth daily.   Nutritional Supplements (FEEDING SUPPLEMENT, BOOST BREEZE,) LIQD Take 1 Can by mouth daily.   Specialty Vitamins Products (LIPOTRIAD VISION SUPPORT PO) Take 1 tablet by mouth daily.   No facility-administered encounter medications on file as of 05/25/2018.     Review of Systems  Constitutional: Negative for appetite change, chills, fatigue, fever and unexpected weight change.  HENT: Negative for congestion, rhinorrhea, sinus pressure, sinus pain, sneezing and sore throat.   Respiratory: Negative for cough, chest tightness, shortness of breath and wheezing.   Cardiovascular: Negative for chest pain, palpitations and leg swelling.  Gastrointestinal: Negative for abdominal distention, abdominal pain, constipation, diarrhea, nausea and vomiting.  Endocrine: Negative  for cold intolerance, heat intolerance, polydipsia, polyphagia and polyuria.  Genitourinary: Negative for difficulty urinating, dysuria, flank pain, frequency and urgency.  Musculoskeletal: Positive for gait problem. Negative for arthralgias.  Skin: Negative for color change, pallor and rash.  Neurological: Negative for dizziness, light-headedness and headaches.  Psychiatric/Behavioral: Negative for agitation, behavioral problems and sleep disturbance. The patient is not nervous/anxious.        Confused at her baseline     Immunization History  Administered Date(s) Administered   Influenza Split 09/18/2017   Influenza, High Dose Seasonal PF 10/09/2016   Influenza,inj,Quad PF,6+ Mos 11/23/2012, 11/30/2013, 12/06/2014   Influenza-Unspecified 10/05/2009, 10/25/2010, 10/13/2011, 11/13/2015   Pneumococcal Conjugate-13 07/01/2013, 02/09/2017   Pneumococcal Polysaccharide-23 06/01/2006, 11/23/2012   Pneumococcal-Unspecified 01/21/1995   Td 08/20/1992, 06/01/2006, 06/23/2014   Tdap 11/25/2012   Zoster 02/11/2006, 11/14/2010   Pertinent  Health Maintenance Due  Topic Date Due   MAMMOGRAM  11/22/2017   INFLUENZA VACCINE  08/21/2018   DEXA SCAN  Completed   PNA vac Low Risk Adult  Completed   Fall Risk  05/25/2018 03/22/2018 12/14/2017 11/04/2017 07/02/2017  Falls in the past year? 1 0 0 Yes No  Number falls in past yr: 0 0 0 1 -  Injury with Fall? 0 0 0 - -  Comment - - - - -  Risk Factor Category  - - - - -  Risk for fall due to : - History of fall(s);Impaired balance/gait - - -  Follow up - Falls prevention discussed - - -    Vitals:   05/25/18 0920  BP: 122/77  Temp: 98.6 F (37 C)   There is no height or weight on file to calculate BMI. Physical Exam  Unable to complete on the phone visit.  Labs reviewed: Recent Labs    12/14/17 1101 03/22/18 0951 05/21/18 1851  NA 142 140 138  K 5.1 5.2 4.7  CL 106 104 97*  CO2 27 28 27   GLUCOSE 74 51* 156*  BUN 35*  35* 32*  CREATININE 1.76* 1.73* 1.71*  CALCIUM 9.9 10.2 10.1   Recent Labs    06/11/17 1100 03/22/18 0951 05/21/18 1851  AST 12 15 20   ALT 12 10 15   ALKPHOS  --   --  76  BILITOT 0.6 0.6 1.1  PROT 6.8 7.0 7.4  ALBUMIN  --   --  4.0   Recent Labs    06/11/17 1100 03/22/18 0951 05/21/18 1851  WBC 4.9 6.5 13.2*  NEUTROABS 3,009 4,310  --   HGB 15.1 14.8 15.7*  HCT 44.2 43.6 48.3*  MCV 93.1 94.8 99.2  PLT 166 170 148*   Lab Results  Component Value Date   TSH 2.02 09/25/2015   Lab Results  Component Value Date   HGBA1C 5.5 03/22/2018   Lab Results  Component Value Date   CHOL 199 06/11/2017   HDL 68 06/11/2017   LDLCALC 114 (H) 06/11/2017   TRIG 80 06/11/2017   CHOLHDL 2.9 06/11/2017    Significant Diagnostic Results in last 30 days:  Ct Abdomen Pelvis Wo Contrast  Result Date: 05/21/2018 CLINICAL DATA:  Dysuria EXAM: CT ABDOMEN AND PELVIS WITHOUT CONTRAST TECHNIQUE: Multidetector CT imaging of the abdomen and pelvis was performed following the standard protocol without IV contrast. COMPARISON:  Renal ultrasound 11/11/2016 FINDINGS: Lower chest: Pectus deformity of the sternum.  No acute abnormality. Hepatobiliary: No focal hepatic abnormality. Gallbladder unremarkable. Pancreas: No focal abnormality or ductal dilatation. Spleen: No focal abnormality.  Normal size. Adrenals/Urinary Tract: Numerous low-density lesions within both kidneys which cannot be characterized without intravenous contrast. No hydronephrosis. Fullness of the adrenal glands bilaterally compatible with hyperplasia. Urinary bladder unremarkable. Stomach/Bowel: Marked circumferential wall thickening within the gastric wall within the antrum and pyloric regions. This is presumably related to severe gastritis although infiltrating tumor cannot be excluded. Moderate stool burden in the colon. Small bowel decompressed, grossly unremarkable. Vascular/Lymphatic: Aortic atherosclerosis. No enlarged abdominal or  pelvic lymph nodes. Reproductive: Uterus and adnexa unremarkable.  No mass. Other: Small amount of free fluid in the cul-de-sac.  No free air. Musculoskeletal: No acute bony abnormality. Degenerative changes in the lower lumbar spine. IMPRESSION: Marked circumferential wall thickening within the gastric antrum and pyloric regions, presumably related to gastritis although infiltrating tumor cannot be excluded. No evidence of perforation. Trace free fluid in the cul-de-sac. Moderate stool burden in the colon. Aortic atherosclerosis. Electronically Signed   By: Rolm Baptise M.D.   On: 05/21/2018 22:37   Ct Head Wo Contrast  Result Date: 05/21/2018 CLINICAL DATA:  Fatigue, malaise EXAM: CT HEAD WITHOUT CONTRAST TECHNIQUE: Contiguous axial images were obtained from the base of the skull through the vertex without intravenous contrast. COMPARISON:  02/07/2016 FINDINGS: Brain: There is atrophy and chronic small vessel disease changes. No acute intracranial abnormality. Specifically, no hemorrhage, hydrocephalus, mass lesion, acute infarction, or significant intracranial injury. Vascular: No hyperdense vessel  or unexpected calcification. Skull: No acute calvarial abnormality. Sinuses/Orbits: Visualized paranasal sinuses and mastoids clear. Orbital soft tissues unremarkable. Other: None IMPRESSION: Atrophy, chronic microvascular disease. No acute intracranial abnormality. Electronically Signed   By: Rolm Baptise M.D.   On: 05/21/2018 22:33   Dg Chest Portable 1 View  Result Date: 05/21/2018 CLINICAL DATA:  Feeling unwell EXAM: PORTABLE CHEST 1 VIEW COMPARISON:  04/06/2012 FINDINGS: Pain The lungs are hyperinflated likely secondary to COPD. There is no focal parenchymal opacity. There is no pleural effusion or pneumothorax. The heart and mediastinal contours are unremarkable. The osseous structures are unremarkable. IMPRESSION: No active disease. Electronically Signed   By: Kathreen Devoid   On: 05/21/2018 21:55     Assessment/Plan 1. Leukocytosis, unspecified type Recent WBC 13.2 (05/21/2018).Reports afebrile today.Temp 98.6 asymptomatic. Will recheck CBC/diff.encouraged to monitor temp curve and notify provider if running any fever >100.5   2. Essential hypertension Had Systolic in the 979'N in the ED but B/P readings reviewed has been within normal range since return from the ED.continue on amlodipine 5 mg tablet daily and nebivolol 5 mg tablet daily.on ASA and Statin for cardiovascular event prophylaxis.   3. Chronic kidney disease (CKD), stage IV (severe) (HCC) CR 1.71 in ED at baseline.continue to avoid nephrotoxins and dose other medications for renal clearance.Her follow up appointments with Nephrology currently on hold due to COVID-19 restrictions.Daughter states will follow up after restrictions are over.  - CMP,future   4. Elevated lipase  Elevated in the ED.will Recheck Lipase.   Family/ staff Communication: Reviewed plan of care with patient and daughter.   Labs/tests ordered: CBC/diff,CMP and lipase, future   [place] C Hae Ahlers, NP

## 2018-05-26 ENCOUNTER — Other Ambulatory Visit: Payer: Self-pay

## 2018-05-26 ENCOUNTER — Other Ambulatory Visit: Payer: Medicare Other

## 2018-05-26 DIAGNOSIS — N184 Chronic kidney disease, stage 4 (severe): Secondary | ICD-10-CM

## 2018-05-26 DIAGNOSIS — R748 Abnormal levels of other serum enzymes: Secondary | ICD-10-CM

## 2018-05-26 DIAGNOSIS — I10 Essential (primary) hypertension: Secondary | ICD-10-CM

## 2018-05-26 DIAGNOSIS — D72829 Elevated white blood cell count, unspecified: Secondary | ICD-10-CM

## 2018-05-26 LAB — COMPLETE METABOLIC PANEL WITH GFR
AG Ratio: 1.3 (calc) (ref 1.0–2.5)
ALT: 14 U/L (ref 6–29)
AST: 15 U/L (ref 10–35)
Albumin: 3.3 g/dL — ABNORMAL LOW (ref 3.6–5.1)
Alkaline phosphatase (APISO): 59 U/L (ref 37–153)
BUN/Creatinine Ratio: 26 (calc) — ABNORMAL HIGH (ref 6–22)
BUN: 43 mg/dL — ABNORMAL HIGH (ref 7–25)
CO2: 27 mmol/L (ref 20–32)
Calcium: 8.7 mg/dL (ref 8.6–10.4)
Chloride: 106 mmol/L (ref 98–110)
Creat: 1.67 mg/dL — ABNORMAL HIGH (ref 0.60–0.88)
GFR, Est African American: 32 mL/min/{1.73_m2} — ABNORMAL LOW (ref >=60)
GFR, Est Non African American: 28 mL/min/{1.73_m2} — ABNORMAL LOW (ref >=60)
Globulin: 2.5 g/dL (calc) (ref 1.9–3.7)
Glucose, Bld: 122 mg/dL (ref 65–139)
Potassium: 4.6 mmol/L (ref 3.5–5.3)
Sodium: 140 mmol/L (ref 135–146)
Total Bilirubin: 0.5 mg/dL (ref 0.2–1.2)
Total Protein: 5.8 g/dL — ABNORMAL LOW (ref 6.1–8.1)

## 2018-05-26 LAB — CBC WITH DIFFERENTIAL/PLATELET
Absolute Monocytes: 570 cells/uL (ref 200–950)
Basophils Absolute: 30 cells/uL (ref 0–200)
Basophils Relative: 0.5 %
Eosinophils Absolute: 120 cells/uL (ref 15–500)
Eosinophils Relative: 2 %
HCT: 36 % (ref 35.0–45.0)
Hemoglobin: 12.1 g/dL (ref 11.7–15.5)
Lymphs Abs: 1086 cells/uL (ref 850–3900)
MCH: 31.9 pg (ref 27.0–33.0)
MCHC: 33.6 g/dL (ref 32.0–36.0)
MCV: 95 fL (ref 80.0–100.0)
MPV: 11.8 fL (ref 7.5–12.5)
Monocytes Relative: 9.5 %
Neutro Abs: 4194 cells/uL (ref 1500–7800)
Neutrophils Relative %: 69.9 %
Platelets: 142 10*3/uL (ref 140–400)
RBC: 3.79 10*6/uL — ABNORMAL LOW (ref 3.80–5.10)
RDW: 12 % (ref 11.0–15.0)
Total Lymphocyte: 18.1 %
WBC: 6 10*3/uL (ref 3.8–10.8)

## 2018-05-26 LAB — LIPASE: Lipase: 140 U/L — ABNORMAL HIGH (ref 7–60)

## 2018-05-29 ENCOUNTER — Emergency Department (HOSPITAL_COMMUNITY): Payer: Medicare Other

## 2018-05-29 ENCOUNTER — Other Ambulatory Visit: Payer: Self-pay

## 2018-05-29 ENCOUNTER — Encounter (HOSPITAL_COMMUNITY): Payer: Self-pay | Admitting: Emergency Medicine

## 2018-05-29 ENCOUNTER — Inpatient Hospital Stay (HOSPITAL_COMMUNITY)
Admission: EM | Admit: 2018-05-29 | Discharge: 2018-05-31 | DRG: 378 | Disposition: A | Discharge status: Home Health | Payer: Medicare Other | Attending: Family Medicine | Admitting: Family Medicine

## 2018-05-29 DIAGNOSIS — I2510 Atherosclerotic heart disease of native coronary artery without angina pectoris: Secondary | ICD-10-CM | POA: Diagnosis present

## 2018-05-29 DIAGNOSIS — N179 Acute kidney failure, unspecified: Secondary | ICD-10-CM | POA: Diagnosis not present

## 2018-05-29 DIAGNOSIS — N184 Chronic kidney disease, stage 4 (severe): Secondary | ICD-10-CM | POA: Diagnosis present

## 2018-05-29 DIAGNOSIS — K921 Melena: Secondary | ICD-10-CM | POA: Diagnosis not present

## 2018-05-29 DIAGNOSIS — D696 Thrombocytopenia, unspecified: Secondary | ICD-10-CM | POA: Diagnosis present

## 2018-05-29 DIAGNOSIS — Z7982 Long term (current) use of aspirin: Secondary | ICD-10-CM

## 2018-05-29 DIAGNOSIS — K922 Gastrointestinal hemorrhage, unspecified: Secondary | ICD-10-CM | POA: Diagnosis not present

## 2018-05-29 DIAGNOSIS — M810 Age-related osteoporosis without current pathological fracture: Secondary | ICD-10-CM | POA: Diagnosis present

## 2018-05-29 DIAGNOSIS — J438 Other emphysema: Secondary | ICD-10-CM | POA: Diagnosis present

## 2018-05-29 DIAGNOSIS — R32 Unspecified urinary incontinence: Secondary | ICD-10-CM | POA: Diagnosis present

## 2018-05-29 DIAGNOSIS — Z820 Family history of epilepsy and other diseases of the nervous system: Secondary | ICD-10-CM

## 2018-05-29 DIAGNOSIS — R739 Hyperglycemia, unspecified: Secondary | ICD-10-CM | POA: Diagnosis present

## 2018-05-29 DIAGNOSIS — H9190 Unspecified hearing loss, unspecified ear: Secondary | ICD-10-CM | POA: Diagnosis present

## 2018-05-29 DIAGNOSIS — I739 Peripheral vascular disease, unspecified: Secondary | ICD-10-CM | POA: Diagnosis present

## 2018-05-29 DIAGNOSIS — Z1159 Encounter for screening for other viral diseases: Secondary | ICD-10-CM

## 2018-05-29 DIAGNOSIS — K254 Chronic or unspecified gastric ulcer with hemorrhage: Principal | ICD-10-CM | POA: Diagnosis present

## 2018-05-29 DIAGNOSIS — F0390 Unspecified dementia without behavioral disturbance: Secondary | ICD-10-CM | POA: Diagnosis present

## 2018-05-29 DIAGNOSIS — Z880 Allergy status to penicillin: Secondary | ICD-10-CM

## 2018-05-29 DIAGNOSIS — N182 Chronic kidney disease, stage 2 (mild): Secondary | ICD-10-CM | POA: Diagnosis present

## 2018-05-29 DIAGNOSIS — R197 Diarrhea, unspecified: Secondary | ICD-10-CM | POA: Diagnosis present

## 2018-05-29 DIAGNOSIS — Z66 Do not resuscitate: Secondary | ICD-10-CM | POA: Diagnosis present

## 2018-05-29 DIAGNOSIS — D62 Acute posthemorrhagic anemia: Secondary | ICD-10-CM | POA: Diagnosis not present

## 2018-05-29 DIAGNOSIS — K2970 Gastritis, unspecified, without bleeding: Secondary | ICD-10-CM

## 2018-05-29 DIAGNOSIS — Z809 Family history of malignant neoplasm, unspecified: Secondary | ICD-10-CM

## 2018-05-29 DIAGNOSIS — G301 Alzheimer's disease with late onset: Secondary | ICD-10-CM | POA: Diagnosis present

## 2018-05-29 DIAGNOSIS — W19XXXA Unspecified fall, initial encounter: Secondary | ICD-10-CM | POA: Diagnosis present

## 2018-05-29 DIAGNOSIS — I10 Essential (primary) hypertension: Secondary | ICD-10-CM | POA: Diagnosis present

## 2018-05-29 DIAGNOSIS — I472 Ventricular tachycardia: Secondary | ICD-10-CM | POA: Diagnosis present

## 2018-05-29 DIAGNOSIS — F0280 Dementia in other diseases classified elsewhere without behavioral disturbance: Secondary | ICD-10-CM | POA: Diagnosis present

## 2018-05-29 DIAGNOSIS — I129 Hypertensive chronic kidney disease with stage 1 through stage 4 chronic kidney disease, or unspecified chronic kidney disease: Secondary | ICD-10-CM | POA: Diagnosis present

## 2018-05-29 DIAGNOSIS — E785 Hyperlipidemia, unspecified: Secondary | ICD-10-CM | POA: Diagnosis present

## 2018-05-29 DIAGNOSIS — Z79899 Other long term (current) drug therapy: Secondary | ICD-10-CM

## 2018-05-29 DIAGNOSIS — I341 Nonrheumatic mitral (valve) prolapse: Secondary | ICD-10-CM | POA: Diagnosis present

## 2018-05-29 DIAGNOSIS — Y92009 Unspecified place in unspecified non-institutional (private) residence as the place of occurrence of the external cause: Secondary | ICD-10-CM

## 2018-05-29 DIAGNOSIS — Z823 Family history of stroke: Secondary | ICD-10-CM

## 2018-05-29 DIAGNOSIS — K259 Gastric ulcer, unspecified as acute or chronic, without hemorrhage or perforation: Secondary | ICD-10-CM

## 2018-05-29 LAB — APTT: aPTT: 30 seconds (ref 24–36)

## 2018-05-29 LAB — CBC WITH DIFFERENTIAL/PLATELET
Abs Immature Granulocytes: 0.26 10*3/uL — ABNORMAL HIGH (ref 0.00–0.07)
Basophils Absolute: 0 10*3/uL (ref 0.0–0.1)
Basophils Relative: 0 %
Eosinophils Absolute: 0 10*3/uL (ref 0.0–0.5)
Eosinophils Relative: 0 %
HCT: 21.3 % — ABNORMAL LOW (ref 36.0–46.0)
Hemoglobin: 6.5 g/dL — CL (ref 12.0–15.0)
Immature Granulocytes: 2 %
Lymphocytes Relative: 10 %
Lymphs Abs: 1.2 10*3/uL (ref 0.7–4.0)
MCH: 32 pg (ref 26.0–34.0)
MCHC: 30.5 g/dL (ref 30.0–36.0)
MCV: 104.9 fL — ABNORMAL HIGH (ref 80.0–100.0)
Monocytes Absolute: 0.5 10*3/uL (ref 0.1–1.0)
Monocytes Relative: 4 %
Neutro Abs: 9.7 10*3/uL — ABNORMAL HIGH (ref 1.7–7.7)
Neutrophils Relative %: 84 %
Platelets: 131 10*3/uL — ABNORMAL LOW (ref 150–400)
RBC: 2.03 MIL/uL — ABNORMAL LOW (ref 3.87–5.11)
RDW: 13 % (ref 11.5–15.5)
WBC: 11.6 10*3/uL — ABNORMAL HIGH (ref 4.0–10.5)
nRBC: 0 % (ref 0.0–0.2)

## 2018-05-29 LAB — COMPREHENSIVE METABOLIC PANEL
ALT: 13 U/L (ref 0–44)
AST: 14 U/L — ABNORMAL LOW (ref 15–41)
Albumin: 2.5 g/dL — ABNORMAL LOW (ref 3.5–5.0)
Alkaline Phosphatase: 38 U/L (ref 38–126)
Anion gap: 11 (ref 5–15)
BUN: 85 mg/dL — ABNORMAL HIGH (ref 8–23)
CO2: 20 mmol/L — ABNORMAL LOW (ref 22–32)
Calcium: 8.5 mg/dL — ABNORMAL LOW (ref 8.9–10.3)
Chloride: 115 mmol/L — ABNORMAL HIGH (ref 98–111)
Creatinine, Ser: 1.82 mg/dL — ABNORMAL HIGH (ref 0.44–1.00)
GFR calc Af Amer: 29 mL/min — ABNORMAL LOW (ref >60)
GFR calc non Af Amer: 25 mL/min — ABNORMAL LOW (ref >60)
Glucose, Bld: 148 mg/dL — ABNORMAL HIGH (ref 70–99)
Potassium: 4.8 mmol/L (ref 3.5–5.1)
Sodium: 146 mmol/L — ABNORMAL HIGH (ref 135–145)
Total Bilirubin: 0.5 mg/dL (ref 0.3–1.2)
Total Protein: 4.9 g/dL — ABNORMAL LOW (ref 6.5–8.1)

## 2018-05-29 LAB — ABO/RH: ABO/RH(D): O POS

## 2018-05-29 LAB — PREPARE RBC (CROSSMATCH)

## 2018-05-29 LAB — CBC
HCT: 21.3 % — ABNORMAL LOW (ref 36.0–46.0)
Hemoglobin: 7 g/dL — ABNORMAL LOW (ref 12.0–15.0)
MCH: 33 pg (ref 26.0–34.0)
MCHC: 32.9 g/dL (ref 30.0–36.0)
MCV: 100.5 fL — ABNORMAL HIGH (ref 80.0–100.0)
Platelets: 111 10*3/uL — ABNORMAL LOW (ref 150–400)
RBC: 2.12 MIL/uL — ABNORMAL LOW (ref 3.87–5.11)
RDW: 13.3 % (ref 11.5–15.5)
WBC: 11.3 10*3/uL — ABNORMAL HIGH (ref 4.0–10.5)
nRBC: 0 % (ref 0.0–0.2)

## 2018-05-29 LAB — SARS CORONAVIRUS 2 BY RT PCR (HOSPITAL ORDER, PERFORMED IN ~~LOC~~ HOSPITAL LAB): SARS Coronavirus 2: NEGATIVE

## 2018-05-29 LAB — PROTIME-INR
INR: 1.2 (ref 0.8–1.2)
Prothrombin Time: 15.3 seconds — ABNORMAL HIGH (ref 11.4–15.2)

## 2018-05-29 LAB — POC OCCULT BLOOD, ED: Fecal Occult Bld: POSITIVE — AB

## 2018-05-29 MED ORDER — ONDANSETRON HCL 4 MG PO TABS: 4.0000 mg | ORAL_TABLET | Freq: Four times a day (QID) | ORAL | Status: DC | PRN

## 2018-05-29 MED ORDER — PANTOPRAZOLE SODIUM 40 MG IV SOLR
40.0000 mg | Freq: Two times a day (BID) | INTRAVENOUS | Status: DC
  Administered 2018-05-29 – 2018-05-30 (×3): 40 mg via INTRAVENOUS
  Filled 2018-05-29 (×3): qty 40

## 2018-05-29 MED ORDER — ACETAMINOPHEN 650 MG RE SUPP: 650.0000 mg | Freq: Four times a day (QID) | RECTAL | Status: DC | PRN

## 2018-05-29 MED ORDER — LACTATED RINGERS IV SOLN
INTRAVENOUS | Status: DC
  Administered 2018-05-29 – 2018-05-30 (×2): via INTRAVENOUS

## 2018-05-29 MED ORDER — ACETAMINOPHEN 325 MG PO TABS: 650.0000 mg | ORAL_TABLET | Freq: Four times a day (QID) | ORAL | Status: DC | PRN

## 2018-05-29 MED ORDER — NEBIVOLOL HCL 5 MG PO TABS
5.0000 mg | ORAL_TABLET | Freq: Every day | ORAL | Status: DC
  Administered 2018-05-30 – 2018-05-31 (×2): 5 mg via ORAL
  Filled 2018-05-29 (×2): qty 1

## 2018-05-29 MED ORDER — TRAZODONE HCL 50 MG PO TABS
50.0000 mg | ORAL_TABLET | Freq: Once | ORAL | Status: AC
  Administered 2018-05-29: 50 mg via ORAL
  Filled 2018-05-29: qty 1

## 2018-05-29 MED ORDER — SODIUM CHLORIDE 0.9 % IV BOLUS
500.0000 mL | Freq: Once | INTRAVENOUS | Status: AC
  Administered 2018-05-29: 500 mL via INTRAVENOUS

## 2018-05-29 MED ORDER — SODIUM CHLORIDE 0.9 % IV SOLN
10.0000 mL/h | Freq: Once | INTRAVENOUS | Status: AC
  Administered 2018-05-29: 10 mL/h via INTRAVENOUS

## 2018-05-29 MED ORDER — ONDANSETRON HCL 4 MG/2ML IJ SOLN: 4.0000 mg | Freq: Four times a day (QID) | INTRAMUSCULAR | Status: DC | PRN

## 2018-05-29 NOTE — Consult Note (Addendum)
Referring Provider: Dr. Karmen Bongo  Primary Care Physician:  Gayland Curry, DO Primary Gastroenterologist: unassigned   Reason for Consultation: anemia, melena, weakness   HPI: Lindsey Salazar is a 85 y.o. female with a history of dementia, hypertension, chronic kidney disease, ischemic heart disease, mitral valve prolapse.She was evaluated in the ED 5/1 2020 with weakness, frequent urination, mild lower abdominal pain and  home SBP 200.  WBC was mildly elevated.  An abdominal/pelvic CT without contrast identified market circumferential wall thickening within the gastric antrum and pyloric regions no evidence of perforation. Trace free fluid in the cul-de-sac.  Moderate stool burden in the colon. She was discharged home. She lives with her 41 husband. She developed dark diarrhea yesterday. She awakened this morning and fell to the ground, her husband found her on the floor incontinent of urine and dark tarry like stool. She was profusely sweaty. She had profound weakness as well. No abdominal pain. No nausea or vomiting. No fever. She takes ASA 71m QOD.  She was sent by ambulance to the ED for further evaluation.  ED course: WBC 11.6.  Hemoglobin 6.5 (on  5/6 hemoglobin was 12.1, on 5/1 hemoglobin was 15.7). Hematocrit 21.3.  MCV 104.9.  Platelet 131.  Sodium 146.  Potassium 4.8.  BUN 85 (up from 43 on 5/6).  Creatinine 1.82.  Alk phos 38.  Albumin 2.5.  AST 14.  ALT 13.  Total bilirubin 0.5.  CT of the head was negative. 1 unit of PRBCs ordered, not yet transfused.  She reported having a normal colonoscopy 10+ years ago.   Her son, [name] is present to assist with her history.   Past Medical History:  Diagnosis Date   Acute bronchitis    Benign neoplasm of uterus, part unspecified    Chronic ischemic heart disease, unspecified    Chronic kidney disease, stage II (mild)    Cough    Encounter for long-term (current) use of other medications    Fall 01/30/2016    Ganglion, unspecified    Hearing loss    Hyperlipidemia    Hypertension    Impacted cerumen    Light-headed feeling 09/25/2015   Macular degeneration (senile) of retina, unspecified    Mitral valve disorders(424.0)    Nonspecific abnormal electrocardiogram (ECG) (EKG)    Osteoporosis, unspecified    Other abnormal blood chemistry    Other emphysema (HCC)    Other malaise and fatigue    Palpitations    Senile cataract, unspecified    Senile purpura ([facility] 06/06/2015   Thrombocytopenia, unspecified ([facility]    Unspecified hearing loss     Past Surgical History:  Procedure Laterality Date   KNEE ARTHROSCOPY     right    Prior to Admission medications   Medication Sig Start Date End Date Taking? Authorizing Provider  amLODipine (NORVASC) 5 MG tablet TAKE 1 TABLET ONCE DAILY FOR BLOOD PRESSURE. Patient taking differently: Take 5 mg by mouth daily. FOR BLOOD PRESSURE. 04/08/18  Yes Reed, Tiffany L, DO  aspirin 81 MG tablet Take 81 mg by mouth every other day.    Yes [provider]  atorvastatin (LIPITOR) 10 MG tablet TAKE 1 TABLET ONCE DAILY TO LOWER CHOLESTEROL. Patient taking differently: Take 10 mg by mouth daily at 6 PM. TO LOWER CHOLESTEROL. 12/14/17  Yes Reed, Tiffany L, DO  Cholecalciferol (VITAMIN D3) 50 MCG (2000 UT) capsule Take 1 capsule (2,000 Units total) by mouth daily. 12/14/17  Yes Reed, Tiffany L, DO  donepezil (ARICEPT) 10 MG tablet TAKE ONE TABLET AT BEDTIME. Patient taking differently: Take 10 mg by mouth at bedtime.  04/08/18  Yes Reed, Tiffany L, DO  memantine (NAMENDA XR) 28 MG CP24 24 hr capsule TAKE 1 CAPSULE DAILY. Patient taking differently: Take 28 mg by mouth daily.  04/08/18  Yes Reed, Tiffany L, DO  nebivolol (BYSTOLIC) 5 MG tablet Take 1 tablet (5 mg total) by mouth daily. 12/21/17  Yes Reed, Tiffany L, DO  Nutritional Supplements (FEEDING SUPPLEMENT, BOOST BREEZE,) LIQD Take 1 Can by mouth daily. Patient taking differently: Take 1  Container by mouth daily.  12/19/17  Yes Reed, Tiffany L, DO  Specialty Vitamins Products (LIPOTRIAD VISION SUPPORT PO) Take 1 tablet by mouth daily.   Yes [provider]    Current Facility-Administered Medications  Medication Dose Route Frequency Provider Last Rate Last Dose   acetaminophen (TYLENOL) tablet 650 mg  650 mg Oral Q6H PRN Karmen Bongo, MD       Or   acetaminophen (TYLENOL) suppository 650 mg  650 mg Rectal Q6H PRN Karmen Bongo, MD       lactated ringers infusion   Intravenous Continuous Karmen Bongo, MD       [START ON 05/30/2018] nebivolol (BYSTOLIC) tablet 5 mg  5 mg Oral Daily Karmen Bongo, MD       ondansetron [facility]) tablet 4 mg  4 mg Oral Q6H PRN Karmen Bongo, MD       Or   ondansetron [facility]) injection 4 mg  4 mg Intravenous Q6H PRN Karmen Bongo, MD       pantoprazole (PROTONIX) injection 40 mg  40 mg Intravenous Lillia Mountain, MD   40 mg at 05/29/18 1342    Allergies as of 05/29/2018 - Review Complete 05/29/2018  Allergen Reaction Noted   Penicillins Nausea And Vomiting and Other (See Comments) 08/07/2010    Family History  Problem Relation Age of Onset   Stroke Brother    Cancer Sister        lung   Parkinsonism Father        Alzheimer's    Social History   Socioeconomic History   Marital status: Single    Spouse name: Not on file   Number of children: 4   Years of education: Not on file   Highest education level: Not on file  Occupational History    Employer: RETIRED  Social Needs   Financial resource strain: Not hard at all   Food insecurity:    Worry: Never true    Inability: Never true   Transportation needs:    Medical: No    Non-medical: No  Tobacco Use   Smoking status: Never Smoker   Smokeless tobacco: Never Used  Substance and Sexual Activity   Alcohol use: No   Drug use: No   Sexual activity: Not Currently  Lifestyle   Physical activity:    Days per week: 3 days     Minutes per session: 10 min   Stress: Not at all  Relationships   Social connections:    Talks on phone: Three times a week    Gets together: Once a week    Attends religious service: More than 4 times per year    Active member of club or organization: No    Attends meetings of clubs or organizations: Never    Relationship status: Married   Intimate partner violence:    Fear of current or ex partner: No  Emotionally abused: No    Physically abused: No    Forced sexual activity: No  Other Topics Concern   Not on file  Social History Narrative   Lives with husband.      Review of Systems: Gen: + sweats this am, no weight loss. CV: Denies chest pain, angina, palpitations, syncope, orthopnea, PND, peripheral edema, and claudication. Resp: Denies chest pain or palpitations. GI: See HPI. Denies vomiting blood, jaundice, and fecal incontinence.   Denies dysphagia or odynophagia. GU : Denies urinary burning or dysuria.  MS: Denies joint pain, + weakness. Derm: Denies rash or skin lesions. Psych: Denies depression or anxiety. Heme: Bruises easily. Neuro:  + numbness to her fingers.   Physical Exam: Vital signs in last 24 hours: Temp:  [97.2 F (36.2 C)-98.3 F (36.8 C)] 98.2 F (36.8 C) (05/09 1403) Pulse Rate:  [36-115] 76 (05/09 1403) Resp:  [7-24] 16 (05/09 1403) BP: (81-131)/(54-87) 107/64 (05/09 1403) SpO2:  [81 %-100 %] 100 % (05/09 1238) Weight:  [47.6 kg-49.9 kg] 47.6 kg (05/09 1238)   General:   Alert,  Well-developed, well-nourished, pleasant and cooperative in NAD Eyes:  Sclera clear, no icterus. Conjunctiva pink. Lungs:  Clear throughout to auscultation.   No wheezes, crackles, or rhonchi. Heart: 2/6 systolic murmur. Abdomen:  Soft,nontender, BS active,nonpalp mass or hsm.   Rectal:  Melenic stool in rectal vault Msk:  Symmetrical without gross deformities. . Pulses:  Normal pulses noted. Extremities:  Without clubbing or edema. Neurologic:  Alert and   oriented x3;  grossly normal neurologically. Skin: jaundice appearance Psych:  Alert and cooperative. Normal mood and affect.  Intake/Output from previous day: No intake/output data recorded. Intake/Output this shift: No intake/output data recorded.  Lab Results: Recent Labs    05/29/18 0741  WBC 11.6*  HGB 6.5*  HCT 21.3*  PLT 131*   BMET Recent Labs    05/29/18 0741  NA 146*  K 4.8  CL 115*  CO2 20*  GLUCOSE 148*  BUN 85*  CREATININE 1.82*  CALCIUM 8.5*   LFT Recent Labs    05/29/18 0741  PROT 4.9*  ALBUMIN 2.5*  AST 14*  ALT 13  ALKPHOS 38  BILITOT 0.5   PT/INR Recent Labs    05/29/18 0741  LABPROT 15.3*  INR 1.2   Hepatitis Panel No results for input(s): HEPBSAG, HCVAB, HEPAIGM, HEPBIGM in the last 72 hours.    Studies/Results: Ct Head Wo Contrast  Result Date: 05/29/2018 CLINICAL DATA:  Pt coming from home arrives via ems. Pt awoke this morning needing to have a BM and got very weak in her legs upon ambulating to the bathroom with walker. Pt BM was very dark. Pts husband and son helped her to the ground. HX of d.*comment was truncated*Head trauma, ataxia EXAM: CT HEAD WITHOUT CONTRAST CT CERVICAL SPINE WITHOUT CONTRAST TECHNIQUE: Multidetector CT imaging of the head and cervical spine was performed following the standard protocol without intravenous contrast. Multiplanar CT image reconstructions of the cervical spine were also generated. COMPARISON:  Head CT 05/21/2018 FINDINGS: CT HEAD FINDINGS Brain: No acute intracranial hemorrhage. No focal mass lesion. No CT evidence of acute infarction. No midline shift or mass effect. No hydrocephalus. Basilar cisterns are patent. There are periventricular and subcortical white matter hypodensities. Generalized cortical atrophy. Vascular: No hyperdense vessel or unexpected calcification. Skull: Normal. Negative for fracture or focal lesion. Sinuses/Orbits: Paranasal sinuses and mastoid air cells are clear. Orbits  are clear. Other: None. CT CERVICAL SPINE FINDINGS Alignment:  Normal alignment of the cervical vertebral bodies. Skull base and vertebrae: Normal craniocervical junction. No loss of vertebral body height or disc height. Normal facet articulation. No evidence of fracture. Soft tissues and spinal canal: No prevertebral soft tissue swelling. No perispinal or epidural hematoma. Disc levels: Mild endplate spurring. Disc space narrowing at C5-C6. Upper chest: Clear Other: None IMPRESSION: 1. No acute intracranial findings. 2. Atrophy and white matter microvascular disease. 3. No cervical spine fracture. 4. Moderate disc osteophytic disease. Electronically Signed   By: Suzy Bouchard M.D.   On: 05/29/2018 08:43   Ct Cervical Spine Wo Contrast  Result Date: 05/29/2018 CLINICAL DATA:  Pt coming from home arrives via ems. Pt awoke this morning needing to have a BM and got very weak in her legs upon ambulating to the bathroom with walker. Pt BM was very dark. Pts husband and son helped her to the ground. HX of d.*comment was truncated*Head trauma, ataxia EXAM: CT HEAD WITHOUT CONTRAST CT CERVICAL SPINE WITHOUT CONTRAST TECHNIQUE: Multidetector CT imaging of the head and cervical spine was performed following the standard protocol without intravenous contrast. Multiplanar CT image reconstructions of the cervical spine were also generated. COMPARISON:  Head CT 05/21/2018 FINDINGS: CT HEAD FINDINGS Brain: No acute intracranial hemorrhage. No focal mass lesion. No CT evidence of acute infarction. No midline shift or mass effect. No hydrocephalus. Basilar cisterns are patent. There are periventricular and subcortical white matter hypodensities. Generalized cortical atrophy. Vascular: No hyperdense vessel or unexpected calcification. Skull: Normal. Negative for fracture or focal lesion. Sinuses/Orbits: Paranasal sinuses and mastoid air cells are clear. Orbits are clear. Other: None. CT CERVICAL SPINE FINDINGS Alignment: Normal  alignment of the cervical vertebral bodies. Skull base and vertebrae: Normal craniocervical junction. No loss of vertebral body height or disc height. Normal facet articulation. No evidence of fracture. Soft tissues and spinal canal: No prevertebral soft tissue swelling. No perispinal or epidural hematoma. Disc levels: Mild endplate spurring. Disc space narrowing at C5-C6. Upper chest: Clear Other: None IMPRESSION: 1. No acute intracranial findings. 2. Atrophy and white matter microvascular disease. 3. No cervical spine fracture. 4. Moderate disc osteophytic disease. Electronically Signed   By: Suzy Bouchard M.D.   On: 05/29/2018 08:43   Dg Chest Port 1 View  Result Date: 05/29/2018 CLINICAL DATA:  Pt awoke this morning needing to have a BM and got very weak in her legs upon ambulating to the bathroom with walker. H/o dementia.weakness EXAM: PORTABLE CHEST 1 VIEW COMPARISON:  05/21/2018 FINDINGS: Normal mediastinum and cardiac silhouette. Lungs are hyperinflated. Normal pulmonary vasculature. No evidence of effusion, infiltrate, or pneumothorax. No acute bony abnormality. IMPRESSION: hyperinflated lungs.  No acute findings. Electronically Signed   By: Suzy Bouchard M.D.   On: 05/29/2018 08:10    IMPRESSION/PLAN  1. 85 y.o. female with UGI bleed/melena with associated anemia. No prior history of anemia. Not on anticoagulation.Hg 6.5 << 12.1 on 5/6, Hg 15.7 on 5/1. BUN 85 with baseline BUN 35. She is hemodynamically stable.  -hold ASA -will need EGD -transfuse PRBCs as ordered by ED physician -repeat H/H after transfusion -NPO for now -monitor for further GI bleeding/melena  2. Hx CKD  3. CAD, Murmur.   4. Dementia  Noralyn Pick  05/29/2018, 2:28 PM

## 2018-05-29 NOTE — ED Notes (Addendum)
Date and time results received: 05/09/200904 (use smartphrase ".now" to insert current time)  Test: hgb Critical Value: 6.5  Name of Provider Notified: murphy,pa   Orders Received? Or Actions Taken?:

## 2018-05-29 NOTE — ED Notes (Signed)
Patient transported to CT 

## 2018-05-29 NOTE — ED Notes (Signed)
ED TO INPATIENT HANDOFF REPORT  ED Nurse Name and Phone #: Ethelle Lyon, RN [phone]  S Name/Age/Gender Lindsey Salazar 85 y.o. female Room/Bed: 018C/018C  Code Status   Code Status: Not on file  Home/SNF/Other Home Patient oriented to: self, place and time Is this baseline? Yes   Triage Complete: Triage complete  Chief Complaint fall/weakness  Triage Note Pt coming from home arrives via ems. Pt awoke this morning needing to have a BM and got very weak in her legs upon ambulating to the bathroom with walker. Pt BM was very dark. Pts husband and son helped her to the ground. HX of dementia. Pt was diaphoretic when ems arrive to house. Pt doesn't remember what happened this morning. Numbness and tingling in hands a feet/les upon arrival to ED.  Denies pain  150 ML NS 120/70 96/60 standing 179 CBG RR 28 82 O2    Allergies Allergies  Allergen Reactions  . Penicillins Nausea And Vomiting    Level of Care/Admitting Diagnosis ED Disposition    ED Disposition Condition Comment   Admit  The patient appears reasonably stabilized for admission considering the current resources, flow, and capabilities available in the ED at this time, and I doubt any other [facility] requiring further screening and/or treatment in the ED prior to admission is  present.       B Medical/Surgery History Past Medical History:  Diagnosis Date  . Acute bronchitis   . Benign neoplasm of uterus, part unspecified   . Chronic ischemic heart disease, unspecified   . Chronic kidney disease, stage II (mild)   . Cough   . Encounter for long-term (current) use of other medications   . Fall 01/30/2016  . Ganglion, unspecified   . Hearing loss   . Hyperlipidemia   . Hypertension   . Impacted cerumen   . Light-headed feeling 09/25/2015  . Macular degeneration (senile) of retina, unspecified   . Mitral valve disorders(424.0)   . Nonspecific abnormal electrocardiogram (ECG) (EKG)   . Nonspecific  abnormal electrocardiogram (ECG) (EKG)   . Osteoporosis, unspecified   . Other abnormal blood chemistry   . Other emphysema ([place])   . Other malaise and fatigue   . Palpitations   . Senile cataract, unspecified   . Senile purpura ([place]) 06/06/2015  . Thrombocytopenia, unspecified ([place])   . Unspecified hearing loss    Past Surgical History:  Procedure Laterality Date  . KNEE ARTHROSCOPY     right     A IV Location/Drains/Wounds Patient Lines/Drains/Airways Status   Active Line/Drains/Airways    Name:   Placement date:   Placement time:   Site:   Days:   Peripheral IV 05/29/18 Right Antecubital   05/29/18    0750    Antecubital   less than 1          Intake/Output Last 24 hours No intake or output data in the 24 hours ending 05/29/18 0957  Labs/Imaging Results for orders placed or performed during the hospital encounter of 05/29/18 (from the past 48 hour(s))  Comprehensive metabolic panel     Status: Abnormal   Collection Time: 05/29/18  7:41 AM  Result Value Ref Range   Sodium 146 (H) 135 - 145 mmol/L   Potassium 4.8 3.5 - 5.1 mmol/L   Chloride 115 (H) 98 - 111 mmol/L   CO2 20 (L) 22 - 32 mmol/L   Glucose, Bld 148 (H) 70 - 99 mg/dL   BUN 85 (H) 8 - 23 mg/dL  Creatinine, Ser 1.82 (H) 0.44 - 1.00 mg/dL   Calcium 8.5 (L) 8.9 - 10.3 mg/dL   Total Protein 4.9 (L) 6.5 - 8.1 g/dL   Albumin 2.5 (L) 3.5 - 5.0 g/dL   AST 14 (L) 15 - 41 U/L   ALT 13 0 - 44 U/L   Alkaline Phosphatase 38 38 - 126 U/L   Total Bilirubin 0.5 0.3 - 1.2 mg/dL   GFR calc non Af Amer 25 (L) >60 mL/min   GFR calc Af Amer 29 (L) >60 mL/min   Anion gap 11 5 - 15    Comment: Performed at [facility] [address], [place], [place] [address]  CBC with Differential     Status: Abnormal   Collection Time: 05/29/18  7:41 AM  Result Value Ref Range   WBC 11.6 (H) 4.0 - 10.5 K/uL   RBC 2.03 (L) 3.87 - 5.11 MIL/uL   Hemoglobin 6.5 (LL) 12.0 - 15.0 g/dL    Comment: REPEATED TO VERIFY THIS CRITICAL  RESULT HAS VERIFIED AND BEEN CALLED TO HILLARY Veronique Warga RN BY KAY WOOLLEN ON 05 09 2020 AT 2595, AND HAS BEEN READ BACK.     HCT 21.3 (L) 36.0 - 46.0 %   MCV 104.9 (H) 80.0 - 100.0 fL   MCH 32.0 26.0 - 34.0 pg   MCHC 30.5 30.0 - 36.0 g/dL   RDW 13.0 11.5 - 15.5 %   Platelets 131 (L) 150 - 400 K/uL   nRBC 0.0 0.0 - 0.2 %   Neutrophils Relative % 84 %   Neutro Abs 9.7 (H) 1.7 - 7.7 K/uL   Lymphocytes Relative 10 %   Lymphs Abs 1.2 0.7 - 4.0 K/uL   Monocytes Relative 4 %   Monocytes Absolute 0.5 0.1 - 1.0 K/uL   Eosinophils Relative 0 %   Eosinophils Absolute 0.0 0.0 - 0.5 K/uL   Basophils Relative 0 %   Basophils Absolute 0.0 0.0 - 0.1 K/uL   Immature Granulocytes 2 %   Abs Immature Granulocytes 0.26 (H) 0.00 - 0.07 K/uL    Comment: Performed at [facility] [address], [place], [place] [address]  APTT     Status: None   Collection Time: 05/29/18  7:41 AM  Result Value Ref Range   aPTT 30 24 - 36 seconds    Comment: Performed at [facility] [address], [place], [place] [address]  Protime-INR     Status: Abnormal   Collection Time: 05/29/18  7:41 AM  Result Value Ref Range   Prothrombin Time 15.3 (H) 11.4 - 15.2 seconds   INR 1.2 0.8 - 1.2    Comment: (NOTE) INR goal varies based on device and disease states. Performed at [facility], [place] [address] [place], [place] [address]   POC occult blood, ED Provider will collect     Status: Abnormal   Collection Time: 05/29/18  8:01 AM  Result Value Ref Range   Fecal Occult Bld POSITIVE (A) NEGATIVE   Ct Head Wo Contrast  Result Date: 05/29/2018 CLINICAL DATA:  Pt coming from home arrives via ems. Pt awoke this morning needing to have a BM and got very weak in her legs upon ambulating to the bathroom with walker. Pt BM was very dark. Pts husband and son helped her to the ground. HX of d.*comment was truncated*Head trauma, ataxia EXAM: CT HEAD WITHOUT CONTRAST CT CERVICAL SPINE WITHOUT CONTRAST TECHNIQUE:  Multidetector CT imaging of  the head and cervical spine was performed following the standard protocol without intravenous contrast. Multiplanar CT image reconstructions of the cervical spine were also generated. COMPARISON:  Head CT 05/21/2018 FINDINGS: CT HEAD FINDINGS Brain: No acute intracranial hemorrhage. No focal mass lesion. No CT evidence of acute infarction. No midline shift or mass effect. No hydrocephalus. Basilar cisterns are patent. There are periventricular and subcortical white matter hypodensities. Generalized cortical atrophy. Vascular: No hyperdense vessel or unexpected calcification. Skull: Normal. Negative for fracture or focal lesion. Sinuses/Orbits: Paranasal sinuses and mastoid air cells are clear. Orbits are clear. Other: None. CT CERVICAL SPINE FINDINGS Alignment: Normal alignment of the cervical vertebral bodies. Skull base and vertebrae: Normal craniocervical junction. No loss of vertebral body height or disc height. Normal facet articulation. No evidence of fracture. Soft tissues and spinal canal: No prevertebral soft tissue swelling. No perispinal or epidural hematoma. Disc levels: Mild endplate spurring. Disc space narrowing at C5-C6. Upper chest: Clear Other: None IMPRESSION: 1. No acute intracranial findings. 2. Atrophy and white matter microvascular disease. 3. No cervical spine fracture. 4. Moderate disc osteophytic disease. Electronically Signed   By: Suzy Bouchard M.D.   On: 05/29/2018 08:43   Ct Cervical Spine Wo Contrast  Result Date: 05/29/2018 CLINICAL DATA:  Pt coming from home arrives via ems. Pt awoke this morning needing to have a BM and got very weak in her legs upon ambulating to the bathroom with walker. Pt BM was very dark. Pts husband and son helped her to the ground. HX of d.*comment was truncated*Head trauma, ataxia EXAM: CT HEAD WITHOUT CONTRAST CT CERVICAL SPINE WITHOUT CONTRAST TECHNIQUE: Multidetector CT imaging of the head and cervical spine was performed  following the standard protocol without intravenous contrast. Multiplanar CT image reconstructions of the cervical spine were also generated. COMPARISON:  Head CT 05/21/2018 FINDINGS: CT HEAD FINDINGS Brain: No acute intracranial hemorrhage. No focal mass lesion. No CT evidence of acute infarction. No midline shift or mass effect. No hydrocephalus. Basilar cisterns are patent. There are periventricular and subcortical white matter hypodensities. Generalized cortical atrophy. Vascular: No hyperdense vessel or unexpected calcification. Skull: Normal. Negative for fracture or focal lesion. Sinuses/Orbits: Paranasal sinuses and mastoid air cells are clear. Orbits are clear. Other: None. CT CERVICAL SPINE FINDINGS Alignment: Normal alignment of the cervical vertebral bodies. Skull base and vertebrae: Normal craniocervical junction. No loss of vertebral body height or disc height. Normal facet articulation. No evidence of fracture. Soft tissues and spinal canal: No prevertebral soft tissue swelling. No perispinal or epidural hematoma. Disc levels: Mild endplate spurring. Disc space narrowing at C5-C6. Upper chest: Clear Other: None IMPRESSION: 1. No acute intracranial findings. 2. Atrophy and white matter microvascular disease. 3. No cervical spine fracture. 4. Moderate disc osteophytic disease. Electronically Signed   By: Suzy Bouchard M.D.   On: 05/29/2018 08:43   Dg Chest Port 1 View  Result Date: 05/29/2018 CLINICAL DATA:  Pt awoke this morning needing to have a BM and got very weak in her legs upon ambulating to the bathroom with walker. H/o dementia.weakness EXAM: PORTABLE CHEST 1 VIEW COMPARISON:  05/21/2018 FINDINGS: Normal mediastinum and cardiac silhouette. Lungs are hyperinflated. Normal pulmonary vasculature. No evidence of effusion, infiltrate, or pneumothorax. No acute bony abnormality. IMPRESSION: hyperinflated lungs.  No acute findings. Electronically Signed   By: Suzy Bouchard M.D.   On:  05/29/2018 08:10    Pending Labs [employer] (From admission, onward)    Start     Ordered  05/29/18 0922  SARS Coronavirus 2 (CEPHEID - Performed in [facility] lab), Hosp Order  (Asymptomatic Patients Labs)  Once,   R    Question:  Rule Out  Answer:  Yes   05/29/18 0921   05/29/18 0908  Prepare RBC  (Adult Blood Administration - PRBC)  Once,   R    Question Answer Comment  # of Units 2 units   Transfusion Indications Actively Bleeding / GI Bleed   If emergent release call blood bank Not emergent release      05/29/18 0907   05/29/18 0757  Type and screen [place]  Once,   STAT    Comments:  [place]    05/29/18 0756   05/29/18 0757  Occult blood card to lab, stool Provider will collect  Once,   STAT    Question:  Specimen to be collected by?  Answer:  Provider will collect   05/29/18 0756          Vitals/Pain Today's Vitals   05/29/18 0900 05/29/18 0915 05/29/18 0930 05/29/18 0945  BP: 127/61 118/68 (!) 81/70 (!) 99/58  Pulse: 90 75 (!) 108 (!) 37  Resp: 14 13 (!) 21 15  Temp:      TempSrc:      SpO2:  97% 97% 100%  Weight:      Height:      PainSc:        Isolation Precautions No active isolations  Medications Medications  0.9 %  sodium chloride infusion (has no administration in time range)  sodium chloride 0.9 % bolus 500 mL (500 mLs Intravenous New Bag/Given 05/29/18 0817)    Mobility walks with device Moderate fall risk   Focused Assessments GI bleed   R Recommendations: See Admitting Provider Note  Report given to:   Additional Notes:. Baseline Dementia. Gets confused on why she is here.

## 2018-05-29 NOTE — H&P (Signed)
History and Physical    Lindsey Salazar BTD:[id number] DOB: March 24, 1932 DOA: 05/29/2018  PCP: Gayland Curry, DO Consultants:  None Patient coming from:  Home - lives with husband [place]; [place]: [place], [place]   Chief Complaint:  Weakness  HPI: Lindsey Salazar is a 85 y.o. female with medical history significant of thrombocytopenia; dementia; macular generation; HTN; HLD; and stage 2 CKD presenting with weakness.  Last Friday, she was here with markedly elevated BP.  She was having abdominal pain.  The evaluation was fairly unremarkable.  She was restless overnight.  She tried to get up about 615 this AM.  Family checked on her and found her in the floor in a pool of sweat.  She had bowel and bladder incontinence - bowel contents were dark and tarry.  She was very weak.  Her BP was relatively low compared to usual.  She has intermittent confusion with baseline dementia.  She is not having abdominal pain reported today.  She did have some diarrhea last night.   ED Course:   Here last week for weakness and abdominal pain, CT unremarkable.  Hgb 15 that day.  Hgb 12 with PCP 3 days ago.  Today, Hgb 6.5.  Incontinent of dark black stool overnight.  Ordered 2 units PRBC.  GI to see.  Review of Systems:  Unable to perform   Ambulatory Status:  Ambulates without assistance or sometimes with a walker  Past Medical History:  Diagnosis Date   Acute bronchitis    Benign neoplasm of uterus, part unspecified    Chronic ischemic heart disease, unspecified    Chronic kidney disease, stage II (mild)    Cough    Encounter for long-term (current) use of other medications    Fall 01/30/2016   Ganglion, unspecified    Hearing loss    Hyperlipidemia    Hypertension    Impacted cerumen    Light-headed feeling 09/25/2015   Macular degeneration (senile) of retina, unspecified    Mitral valve disorders(424.0)    Nonspecific abnormal electrocardiogram (ECG) (EKG)     Osteoporosis, unspecified    Other abnormal blood chemistry    Other emphysema (HCC)    Other malaise and fatigue    Palpitations    Senile cataract, unspecified    Senile purpura ([place]) 06/06/2015   Thrombocytopenia, unspecified (HCC)    Unspecified hearing loss     Past Surgical History:  Procedure Laterality Date   KNEE ARTHROSCOPY     right    Social History   Socioeconomic History   Marital status: Single    Spouse name: Not on file   Number of children: 4   Years of education: Not on file   Highest education level: Not on file  Occupational History    Employer: RETIRED  Social Needs   Financial resource strain: Not hard at all   Food insecurity:    Worry: Never true    Inability: Never true   Transportation needs:    Medical: No    Non-medical: No  Tobacco Use   Smoking status: Never Smoker   Smokeless tobacco: Never Used  Substance and Sexual Activity   Alcohol use: No   Drug use: No   Sexual activity: Not Currently  Lifestyle   Physical activity:    Days per week: 3 days    Minutes per session: 10 min   Stress: Not at all  Relationships   Social connections:    Talks on phone: Three times  a week    Gets together: Once a week    Attends religious service: More than 4 times per year    Active member of club or organization: No    Attends meetings of clubs or organizations: Never    Relationship status: Married   Intimate partner violence:    Fear of current or ex partner: No    Emotionally abused: No    Physically abused: No    Forced sexual activity: No  Other Topics Concern   Not on file  Social History Narrative   Lives with husband.      Allergies  Allergen Reactions   Penicillins Nausea And Vomiting and Other (See Comments)    Did it involve swelling of the face/tongue/throat, SOB, or low BP? Unknown Did it involve sudden or severe rash/hives, skin peeling, or any reaction on the inside of your mouth or nose?  Unknown Did you need to seek medical attention at a hospital or doctor's office? Unknown When did it last happen?unk If all above answers are NO, may proceed with cephalosporin use.     Family History  Problem Relation Age of Onset   Stroke Brother    Cancer Sister        lung   Parkinsonism Father        Alzheimer's    Prior to Admission medications   Medication Sig Start Date End Date Taking? Authorizing Provider  amLODipine (NORVASC) 5 MG tablet TAKE 1 TABLET ONCE DAILY FOR BLOOD PRESSURE. Patient taking differently: Take 5 mg by mouth daily. FOR BLOOD PRESSURE. 04/08/18  Yes Reed, Tiffany L, DO  aspirin 81 MG tablet Take 81 mg by mouth every other day.    Yes [provider]  atorvastatin (LIPITOR) 10 MG tablet TAKE 1 TABLET ONCE DAILY TO LOWER CHOLESTEROL. Patient taking differently: Take 10 mg by mouth daily at 6 PM. TO LOWER CHOLESTEROL. 12/14/17  Yes Reed, Tiffany L, DO  Cholecalciferol (VITAMIN D3) 50 MCG (2000 UT) capsule Take 1 capsule (2,000 Units total) by mouth daily. 12/14/17  Yes Reed, Tiffany L, DO  donepezil (ARICEPT) 10 MG tablet TAKE ONE TABLET AT BEDTIME. Patient taking differently: Take 10 mg by mouth at bedtime.  04/08/18  Yes Reed, Tiffany L, DO  memantine (NAMENDA XR) 28 MG CP24 24 hr capsule TAKE 1 CAPSULE DAILY. Patient taking differently: Take 28 mg by mouth daily.  04/08/18  Yes Reed, Tiffany L, DO  nebivolol (BYSTOLIC) 5 MG tablet Take 1 tablet (5 mg total) by mouth daily. 12/21/17  Yes Reed, Tiffany L, DO  Nutritional Supplements (FEEDING SUPPLEMENT, BOOST BREEZE,) LIQD Take 1 Can by mouth daily. Patient taking differently: Take 1 Container by mouth daily.  12/19/17  Yes Reed, Tiffany L, DO  Specialty Vitamins Products (LIPOTRIAD VISION SUPPORT PO) Take 1 tablet by mouth daily.   Yes [provider]    Physical Exam: Vitals:   05/29/18 1403 05/29/18 1420 05/29/18 1440 05/29/18 1501  BP: 107/64  120/61 123/64  Pulse: 76  76  75  Resp: 16  16 16   Temp: 98.2 F (36.8 C)  98.2 F (36.8 C) 98.1 F (36.7 C)  TempSrc: Oral  Oral Oral  SpO2:  93% 97% 97%  Weight:      Height:          General:  Appears calm and comfortable and is NAD  Eyes:  PERRL, EOMI, normal lids, iris  ENT:  grossly normal hearing, lips & tongue, mmm  Neck:  no LAD, masses or thyromegaly  Cardiovascular:  RRR, no m/r/g. No LE edema.   Respiratory:   CTA bilaterally with no wheezes/rales/rhonchi.  Normal respiratory effort.  Abdomen:  soft, NT, ND, NABS  Skin:  no rash or induration seen on limited exam  Musculoskeletal:  grossly normal tone BUE/BLE, good ROM, no bony abnormality  Psychiatric:  grossly normal mood and affect, speech fluent and appropriate but clearly confused and defers to her son for most decisions, AOx1  Neurologic:  CN 2-12 grossly intact, moves all extremities in coordinated fashion, sensation intact    Radiological Exams on Admission: Ct Head Wo Contrast  Result Date: 05/29/2018 CLINICAL DATA:  Pt coming from home arrives via ems. Pt awoke this morning needing to have a BM and got very weak in her legs upon ambulating to the bathroom with walker. Pt BM was very dark. Pts husband and son helped her to the ground. HX of d.*comment was truncated*Head trauma, ataxia EXAM: CT HEAD WITHOUT CONTRAST CT CERVICAL SPINE WITHOUT CONTRAST TECHNIQUE: Multidetector CT imaging of the head and cervical spine was performed following the standard protocol without intravenous contrast. Multiplanar CT image reconstructions of the cervical spine were also generated. COMPARISON:  Head CT 05/21/2018 FINDINGS: CT HEAD FINDINGS Brain: No acute intracranial hemorrhage. No focal mass lesion. No CT evidence of acute infarction. No midline shift or mass effect. No hydrocephalus. Basilar cisterns are patent. There are periventricular and subcortical white matter hypodensities. Generalized cortical atrophy. Vascular: No hyperdense vessel or  unexpected calcification. Skull: Normal. Negative for fracture or focal lesion. Sinuses/Orbits: Paranasal sinuses and mastoid air cells are clear. Orbits are clear. Other: None. CT CERVICAL SPINE FINDINGS Alignment: Normal alignment of the cervical vertebral bodies. Skull base and vertebrae: Normal craniocervical junction. No loss of vertebral body height or disc height. Normal facet articulation. No evidence of fracture. Soft tissues and spinal canal: No prevertebral soft tissue swelling. No perispinal or epidural hematoma. Disc levels: Mild endplate spurring. Disc space narrowing at C5-C6. Upper chest: Clear Other: None IMPRESSION: 1. No acute intracranial findings. 2. Atrophy and white matter microvascular disease. 3. No cervical spine fracture. 4. Moderate disc osteophytic disease. Electronically Signed   By: Suzy Bouchard M.D.   On: 05/29/2018 08:43   Ct Cervical Spine Wo Contrast  Result Date: 05/29/2018 CLINICAL DATA:  Pt coming from home arrives via ems. Pt awoke this morning needing to have a BM and got very weak in her legs upon ambulating to the bathroom with walker. Pt BM was very dark. Pts husband and son helped her to the ground. HX of d.*comment was truncated*Head trauma, ataxia EXAM: CT HEAD WITHOUT CONTRAST CT CERVICAL SPINE WITHOUT CONTRAST TECHNIQUE: Multidetector CT imaging of the head and cervical spine was performed following the standard protocol without intravenous contrast. Multiplanar CT image reconstructions of the cervical spine were also generated. COMPARISON:  Head CT 05/21/2018 FINDINGS: CT HEAD FINDINGS Brain: No acute intracranial hemorrhage. No focal mass lesion. No CT evidence of acute infarction. No midline shift or mass effect. No hydrocephalus. Basilar cisterns are patent. There are periventricular and subcortical white matter hypodensities. Generalized cortical atrophy. Vascular: No hyperdense vessel or unexpected calcification. Skull: Normal. Negative for fracture or  focal lesion. Sinuses/Orbits: Paranasal sinuses and mastoid air cells are clear. Orbits are clear. Other: None. CT CERVICAL SPINE FINDINGS Alignment: Normal alignment of the cervical vertebral bodies. Skull base and vertebrae: Normal craniocervical junction. No loss of vertebral body height or disc height. Normal facet articulation. No evidence  of fracture. Soft tissues and spinal canal: No prevertebral soft tissue swelling. No perispinal or epidural hematoma. Disc levels: Mild endplate spurring. Disc space narrowing at C5-C6. Upper chest: Clear Other: None IMPRESSION: 1. No acute intracranial findings. 2. Atrophy and white matter microvascular disease. 3. No cervical spine fracture. 4. Moderate disc osteophytic disease. Electronically Signed   By: Suzy Bouchard M.D.   On: 05/29/2018 08:43   Dg Chest Port 1 View  Result Date: 05/29/2018 CLINICAL DATA:  Pt awoke this morning needing to have a BM and got very weak in her legs upon ambulating to the bathroom with walker. H/o dementia.weakness EXAM: PORTABLE CHEST 1 VIEW COMPARISON:  05/21/2018 FINDINGS: Normal mediastinum and cardiac silhouette. Lungs are hyperinflated. Normal pulmonary vasculature. No evidence of effusion, infiltrate, or pneumothorax. No acute bony abnormality. IMPRESSION: hyperinflated lungs.  No acute findings. Electronically Signed   By: Suzy Bouchard M.D.   On: 05/29/2018 08:10    EKG: Independently reviewed.  NSR with rate 68; nonspecific ST changes with no evidence of acute ischemia   Labs on Admission: I have personally reviewed the available labs and imaging studies at the time of the admission.  Pertinent labs:   Na++ 146 CO2 20 Glucose 148 BUN 85/Creatinine 1.82/GFR 25; 43/1.67/28 on 5/6 - which appears to be about baseline Albumin 2.5 WBC 11.6 Hgb 6.5; 12.1 on 5/6; baseline about 15 Platelets 131 INR 1.2 Heme positive  Assessment/Plan Principal Problem:   Upper GI bleeding Active Problems:    Hyperlipidemia   HTN (hypertension)   Hyperglycemia   Late onset Alzheimer's disease without behavioral disturbance (HCC)   Chronic kidney disease (CKD), stage IV (severe) (HCC)   Acute blood loss anemia   Upper GI Bleeding with ABLA -Patient with overnight onset of dark tarry stools, but with abdominal pain prior and unremarkable ER evaluation -Differential diagnoses is gastric or duodenal ulcer vs. Gastritis/esophagitis. -Marked symptomatic anemia with weakness; she has been transfused with 2 units PRBC by the ER  -Will observe for now in tele bed -GI consulted by ED, will follow up recommendations -NPO after MN for EGD tomorrow; family reports that Dr. Bryan Lemma is ok with clear liquids tonight and so this has been ordered -Will hold ASA -LR at 75 mL/hr -Start IV pantoprazole 40 mg bid -Zofran IV for nausea -Avoid NSAIDs and SQ heparin -Maintain IV access (2 large bore IVs if possible). -Monitor closely for repeat bleeding -Recheck CBC post-transfusion and again at 0500  Alzheimer's -Patient has fairly advanced dementia and is unable to provide history -She is quite pleasant and does not appear to have behavioral disturbance -Will hold Aricept and Namenda, as these are unlikely to be beneficial in an inpatient setting  Stage IV CKD -Appears to be stable at this tim  HTN -Continue Bystolic, Norvasc  Hyperglycemia -May be stress response -Will follow with fasting AM labs -It is unlikely that she will need acute or chronic treatment for this issue  HLD -Consider Lipitor for now -However, this medication may contribute to memory loss and is unlikely to provide long-term significant improvement in morbidity and mortality for this patient and so it may be reasonable to stop it as an outpatient     Note: This patient has been tested and is negative for the novel coronavirus COVID-19.  DVT prophylaxis:  SCDs Code Status: DNR - confirmed with family Family Communication:  Son was present throughout evaluation and has been given permission to remain with the patient, as desired  Disposition Plan:  Home once clinically improved Consults called: GI  Admission status: It is my clinical opinion that referral for OBSERVATION is reasonable and necessary in this patient based on the above information provided. The aforementioned taken together are felt to place the patient at high risk for further clinical deterioration. However it is anticipated that the patient may be medically stable for discharge from the hospital within 24 to 48 hours.     Karmen Bongo MD [facility]   How to contact the [facility] Attending or Consulting provider [place] or covering provider during after hours [place], for this patient?  1. Check the care team in [facility] and look for a) attending/consulting [facility] provider listed and b) the [facility] team listed 2. Log into [email or web address] and use 's universal password to access. If you do not have the password, please contact the hospital operator. 3. Locate the [facility] provider you are looking for under [facility] and page to a number that you can be directly reached. 4. If you still have difficulty reaching the provider, please page the [facility] (Director on Call) for the Hospitalists listed on amion for assistance.   05/29/2018, 5:43 PM

## 2018-05-29 NOTE — ED Triage Notes (Signed)
Pt coming from home arrives via ems. Pt awoke this morning needing to have a BM and got very weak in her legs upon ambulating to the bathroom with walker. Pt BM was very dark. Pts husband and son helped her to the ground. HX of dementia. Pt was diaphoretic when ems arrive to house. Pt doesn't remember what happened this morning. Numbness and tingling in hands a feet/les upon arrival to ED.  Denies pain  150 ML NS 120/70 96/60 standing 179 CBG RR 28 82 O2

## 2018-05-29 NOTE — ED Notes (Signed)
Pt placed on purewick 

## 2018-05-29 NOTE — ED Provider Notes (Signed)
[facility] EMERGENCY DEPARTMENT Provider Note   CSN: [id number] Arrival date & time: 05/29/18  0277    History   Chief Complaint Chief Complaint  Patient presents with   Weakness    HPI Lindsey Salazar is a 85 y.o. female.     85yo female brought in by EMS for weakness and fall today. History provided by patient's son due to history of dementia, patient was up frequently throughout the night to go to the bathroom, used her walker throughout the night (does not always need a walker). Son states patient fell this morning, was found on the floor sweaty by another son who lives in the home, noted to have loss of bowel and bladder control, patient was awake but not responding to questions for several minutes. EMS was called, noted dark stool. Patient does not have any memory of the event, is alert to person and place and recognizes son at bedside. Patient is not on blood thinners, no history of prior GI bleed. Son reports patient was in the hospital last week for elevated BP, states BP readings lately have been low without any complaints from the patient. 1 report of abdominal pain last week, none today. Patient's only complaint at this time is that her hands and feet are tingling and feeling generally weak.     Past Medical History:  Diagnosis Date   Acute bronchitis    Benign neoplasm of uterus, part unspecified    Chronic ischemic heart disease, unspecified    Chronic kidney disease, stage II (mild)    Cough    Encounter for long-term (current) use of other medications    Fall 01/30/2016   Ganglion, unspecified    Hearing loss    Hyperlipidemia    Hypertension    Impacted cerumen    Light-headed feeling 09/25/2015   Macular degeneration (senile) of retina, unspecified    Mitral valve disorders(424.0)    Nonspecific abnormal electrocardiogram (ECG) (EKG)    Osteoporosis, unspecified    Other abnormal blood chemistry    Other  emphysema (HCC)    Other malaise and fatigue    Palpitations    Senile cataract, unspecified    Senile purpura ([place]) 06/06/2015   Thrombocytopenia, unspecified ([place])    Unspecified hearing loss     Patient Active Problem List   Diagnosis Date Noted   Osteopenia 12/14/2017   Adult BMI <19 kg/sq m 12/14/2017   Underweight 12/14/2017   Late onset Alzheimer's disease without behavioral disturbance ([place]) 10/09/2016   Chronic kidney disease (CKD), stage IV (severe) ([place]) 10/09/2016   Fracture, sacrum/coccyx ([place]) 01/30/2016   Syncope 01/30/2016   Light-headed feeling 09/25/2015   Senile purpura ([place]) 06/06/2015   Palpitations 05/30/2014   Hyperglycemia 11/23/2012   Thrombocytopenia, unspecified ([place]) 05/18/2012   Mitral valve disorder 05/18/2012   Senile cataract, unspecified 05/18/2012   Abnormal EKG 04/15/2011   HTN (hypertension) 04/15/2011   Hyperlipidemia     Past Surgical History:  Procedure Laterality Date   KNEE ARTHROSCOPY     right     OB History   No obstetric history on file.      Home Medications    Prior to Admission medications   Medication Sig Start Date End Date Taking? Authorizing Provider  amLODipine (NORVASC) 5 MG tablet TAKE 1 TABLET ONCE DAILY FOR BLOOD PRESSURE. Patient taking differently: Take 5 mg by mouth daily. FOR BLOOD PRESSURE. 04/08/18  Yes Reed, Tiffany L, DO  aspirin 81 MG tablet Take 81  mg by mouth every other day.    Yes [provider]  atorvastatin (LIPITOR) 10 MG tablet TAKE 1 TABLET ONCE DAILY TO LOWER CHOLESTEROL. Patient taking differently: Take 10 mg by mouth daily at 6 PM. TO LOWER CHOLESTEROL. 12/14/17  Yes Reed, Tiffany L, DO  Cholecalciferol (VITAMIN D3) 50 MCG (2000 UT) capsule Take 1 capsule (2,000 Units total) by mouth daily. 12/14/17  Yes Reed, Tiffany L, DO  donepezil (ARICEPT) 10 MG tablet TAKE ONE TABLET AT BEDTIME. Patient taking differently: Take 10 mg by mouth at bedtime.  04/08/18   Yes Reed, Tiffany L, DO  memantine (NAMENDA XR) 28 MG CP24 24 hr capsule TAKE 1 CAPSULE DAILY. Patient taking differently: Take 28 mg by mouth daily.  04/08/18  Yes Reed, Tiffany L, DO  nebivolol (BYSTOLIC) 5 MG tablet Take 1 tablet (5 mg total) by mouth daily. 12/21/17  Yes Reed, Tiffany L, DO  Nutritional Supplements (FEEDING SUPPLEMENT, BOOST BREEZE,) LIQD Take 1 Can by mouth daily. Patient taking differently: Take 1 Container by mouth daily.  12/19/17  Yes Reed, Tiffany L, DO  Specialty Vitamins Products (LIPOTRIAD VISION SUPPORT PO) Take 1 tablet by mouth daily.   Yes [provider]    Family History Family History  Problem Relation Age of Onset   Stroke Brother    Cancer Sister        lung   Parkinsonism Father        Alzheimer's    Social History Social History   Tobacco Use   Smoking status: Never Smoker   Smokeless tobacco: Never Used  Substance Use Topics   Alcohol use: No   Drug use: No     Allergies   Penicillins   Review of Systems Review of Systems  Unable to perform ROS: Dementia  Constitutional: Negative for fever.  Respiratory: Negative for shortness of breath.   Cardiovascular: Negative for chest pain.  Gastrointestinal: Positive for blood in stool. Negative for abdominal pain, nausea and vomiting.       Patient reports dark stool recently (unsure when)  Musculoskeletal: Negative for back pain and neck pain.  Skin: Negative for wound.  Allergic/Immunologic: Negative for immunocompromised state.  Neurological: Positive for weakness.     Physical Exam Updated Vital Signs BP 103/61    Pulse (!) 101    Temp (!) 97.2 F (36.2 C) (Oral)    Resp 12    Ht 5\' 6"  (1.676 m)    Wt 49.9 kg    SpO2 95%    BMI 17.76 kg/m   Physical Exam Vitals signs and nursing note reviewed. Exam conducted with a chaperone present.  Constitutional:      General: She is not in acute distress.    Appearance: She is well-developed. She is not diaphoretic.    HENT:     Head: Normocephalic and atraumatic.  Eyes:     Pupils: Pupils are equal, round, and reactive to light.     Comments: Conjunctiva pale  Neck:     Musculoskeletal: Neck supple. No muscular tenderness.  Cardiovascular:     Rate and Rhythm: Normal rate and regular rhythm.     Heart sounds: Normal heart sounds.  Pulmonary:     Effort: Pulmonary effort is normal.     Breath sounds: Normal breath sounds.  Abdominal:     Palpations: Abdomen is soft.     Tenderness: There is no abdominal tenderness.  Genitourinary:    Rectum: Guaiac result positive.  Comments: Dark stool present externally on exam, area cleaned and rectal exam completed revealing dark tarry stool Musculoskeletal: Normal range of motion.        General: No tenderness or signs of injury.     Right lower leg: No edema.     Left lower leg: No edema.     Comments: Palpable DP left foot, not palpated on right foot. Right PT palpable   Skin:    General: Skin is warm and dry.     Coloration: Skin is pale.  Neurological:     General: No focal deficit present.     Mental Status: She is alert and oriented to person, place, and time.     GCS: GCS eye subscore is 4. GCS verbal subscore is 4. GCS motor subscore is 6.     Cranial Nerves: Cranial nerves are intact.     Sensory: Sensation is intact.     Motor: No weakness.     Comments: Reports tingling in hands and feet  Psychiatric:        Behavior: Behavior normal.      ED Treatments / Results  Labs (all labs ordered are listed, but only abnormal results are displayed) Labs Reviewed  COMPREHENSIVE METABOLIC PANEL - Abnormal; Notable for the following components:      Result Value   Sodium 146 (*)    Chloride 115 (*)    CO2 20 (*)    Glucose, Bld 148 (*)    BUN 85 (*)    Creatinine, Ser 1.82 (*)    Calcium 8.5 (*)    Total Protein 4.9 (*)    Albumin 2.5 (*)    AST 14 (*)    GFR calc non Af Amer 25 (*)    GFR calc Af Amer 29 (*)    All other  components within normal limits  CBC WITH DIFFERENTIAL/PLATELET - Abnormal; Notable for the following components:   WBC 11.6 (*)    RBC 2.03 (*)    Hemoglobin 6.5 (*)    HCT 21.3 (*)    MCV 104.9 (*)    Platelets 131 (*)    Neutro Abs 9.7 (*)    Abs Immature Granulocytes 0.26 (*)    All other components within normal limits  PROTIME-INR - Abnormal; Notable for the following components:   Prothrombin Time 15.3 (*)    All other components within normal limits  POC OCCULT BLOOD, ED - Abnormal; Notable for the following components:   Fecal Occult Bld POSITIVE (*)    All other components within normal limits  SARS CORONAVIRUS 2 (HOSPITAL ORDER, [place] [facility])  APTT  OCCULT BLOOD X 1 CARD TO LAB, STOOL  TYPE AND SCREEN  PREPARE RBC (CROSSMATCH)    EKG EKG Interpretation  Date/Time:  Saturday May 29 2018 07:30:58 EDT Ventricular Rate:  68 PR Interval:    QRS Duration: 93 QT Interval:  428 QTC Calculation: 456 R Axis:   37 Text Interpretation:  Sinus rhythm Probable left ventricular hypertrophy Confirmed by Elnora Morrison [phone]) on 05/29/2018 8:19:52 AM   Radiology Ct Head Wo Contrast  Result Date: 05/29/2018 CLINICAL DATA:  Pt coming from home arrives via ems. Pt awoke this morning needing to have a BM and got very weak in her legs upon ambulating to the bathroom with walker. Pt BM was very dark. Pts husband and son helped her to the ground. HX of d.*comment was truncated*Head trauma, ataxia EXAM: CT HEAD WITHOUT CONTRAST CT  CERVICAL SPINE WITHOUT CONTRAST TECHNIQUE: Multidetector CT imaging of the head and cervical spine was performed following the standard protocol without intravenous contrast. Multiplanar CT image reconstructions of the cervical spine were also generated. COMPARISON:  Head CT 05/21/2018 FINDINGS: CT HEAD FINDINGS Brain: No acute intracranial hemorrhage. No focal mass lesion. No CT evidence of acute infarction. No midline shift or mass effect.  No hydrocephalus. Basilar cisterns are patent. There are periventricular and subcortical white matter hypodensities. Generalized cortical atrophy. Vascular: No hyperdense vessel or unexpected calcification. Skull: Normal. Negative for fracture or focal lesion. Sinuses/Orbits: Paranasal sinuses and mastoid air cells are clear. Orbits are clear. Other: None. CT CERVICAL SPINE FINDINGS Alignment: Normal alignment of the cervical vertebral bodies. Skull base and vertebrae: Normal craniocervical junction. No loss of vertebral body height or disc height. Normal facet articulation. No evidence of fracture. Soft tissues and spinal canal: No prevertebral soft tissue swelling. No perispinal or epidural hematoma. Disc levels: Mild endplate spurring. Disc space narrowing at C5-C6. Upper chest: Clear Other: None IMPRESSION: 1. No acute intracranial findings. 2. Atrophy and white matter microvascular disease. 3. No cervical spine fracture. 4. Moderate disc osteophytic disease. Electronically Signed   By: Suzy Bouchard M.D.   On: 05/29/2018 08:43   Ct Cervical Spine Wo Contrast  Result Date: 05/29/2018 CLINICAL DATA:  Pt coming from home arrives via ems. Pt awoke this morning needing to have a BM and got very weak in her legs upon ambulating to the bathroom with walker. Pt BM was very dark. Pts husband and son helped her to the ground. HX of d.*comment was truncated*Head trauma, ataxia EXAM: CT HEAD WITHOUT CONTRAST CT CERVICAL SPINE WITHOUT CONTRAST TECHNIQUE: Multidetector CT imaging of the head and cervical spine was performed following the standard protocol without intravenous contrast. Multiplanar CT image reconstructions of the cervical spine were also generated. COMPARISON:  Head CT 05/21/2018 FINDINGS: CT HEAD FINDINGS Brain: No acute intracranial hemorrhage. No focal mass lesion. No CT evidence of acute infarction. No midline shift or mass effect. No hydrocephalus. Basilar cisterns are patent. There are  periventricular and subcortical white matter hypodensities. Generalized cortical atrophy. Vascular: No hyperdense vessel or unexpected calcification. Skull: Normal. Negative for fracture or focal lesion. Sinuses/Orbits: Paranasal sinuses and mastoid air cells are clear. Orbits are clear. Other: None. CT CERVICAL SPINE FINDINGS Alignment: Normal alignment of the cervical vertebral bodies. Skull base and vertebrae: Normal craniocervical junction. No loss of vertebral body height or disc height. Normal facet articulation. No evidence of fracture. Soft tissues and spinal canal: No prevertebral soft tissue swelling. No perispinal or epidural hematoma. Disc levels: Mild endplate spurring. Disc space narrowing at C5-C6. Upper chest: Clear Other: None IMPRESSION: 1. No acute intracranial findings. 2. Atrophy and white matter microvascular disease. 3. No cervical spine fracture. 4. Moderate disc osteophytic disease. Electronically Signed   By: Suzy Bouchard M.D.   On: 05/29/2018 08:43   Dg Chest Port 1 View  Result Date: 05/29/2018 CLINICAL DATA:  Pt awoke this morning needing to have a BM and got very weak in her legs upon ambulating to the bathroom with walker. H/o dementia.weakness EXAM: PORTABLE CHEST 1 VIEW COMPARISON:  05/21/2018 FINDINGS: Normal mediastinum and cardiac silhouette. Lungs are hyperinflated. Normal pulmonary vasculature. No evidence of effusion, infiltrate, or pneumothorax. No acute bony abnormality. IMPRESSION: hyperinflated lungs.  No acute findings. Electronically Signed   By: Suzy Bouchard M.D.   On: 05/29/2018 08:10    Procedures .Critical Care Performed by: Tacy Learn, PA-C  Authorized by: Tacy Learn, PA-C   Critical care provider statement:    Critical care time (minutes):  45   Critical care was necessary to treat or prevent imminent or life-threatening deterioration of the following conditions:  Metabolic crisis, circulatory failure, renal failure, shock and trauma    Critical care was time spent personally by me on the following activities:  Discussions with consultants, evaluation of patient's response to treatment, examination of patient, ordering and performing treatments and interventions, ordering and review of laboratory studies, ordering and review of radiographic studies, pulse oximetry, re-evaluation of patient's condition, obtaining history from patient or surrogate and review of old charts   (including critical care time)  Medications Ordered in ED Medications  0.9 %  sodium chloride infusion (has no administration in time range)  sodium chloride 0.9 % bolus 500 mL (0 mLs Intravenous Stopped 05/29/18 0900)     Initial Impression / Assessment and Plan / ED Course  I have reviewed the triage vital signs and the nursing notes.  Pertinent labs & imaging results that were available during my care of the patient were reviewed by me and considered in my medical decision making (see chart for details).  Clinical Course as of May 29 1123  Sat May 29, 2018  0916 85yo female with past medical history of dementia brought in by EMS from home for weakness and fall today. Patient's son is at bedside and assists with history. Patient reports feeling weak with tingling in her hands and feet, is aware she has had dark stool recently. On exam, grossly positive hemoccult with dark/tarry stool, patient is pale, abdomen is soft and non tender. No history of prior GI bleed patient/family is aware of, no history of anemia, no prior blood transfusions, not on anticoagulant. Patient was seen in the ER 8 days ago, Hgb 15 at that time, recheck with PCP 3 days later and noted to have hgb of 12. CT abdomen/pelvis completed in the ER, found to have thickening of the pylorus.    [LM]  [id number] Patient's hgb today is 6.5, transfusion ordered, discussed with patient and her son who accept transfusion of PRBCs. Hemoccult positive. CMP with increase in BUN to 85 today likely due to GI  bleed, slight increase in Cr to 1.82. COVID screening test ordered for pending admission with consult to GI and hospitalist for care. Case discussed with Dr. Reather Converse, ER attending, who has seen the patient and agrees with plan of care.    [LM]  6834 CXR shows hyperinflation, otherwise normal, no acute injury identified on CT head or neck ordered due to unwitnessed fall.    [LM]  1036 Review of vitals and verified- BP stable at 100/70 with HR in the 70s. Patient is alert, talking to son at bedside. Awaiting consult with GI and hospitalist, both have been repaged.    [LM]  1123 Case discussed with Cottie Banda, GI NP, will consult on the patient. Case discussed with Dr. Lorin Mercy with [facility] who will see the patient.    [LM]    Clinical Course User Index [LM] Tacy Learn, PA-C      Final Clinical Impressions(s) / ED Diagnoses   Final diagnoses:  Gastrointestinal hemorrhage with melena  Anemia due to blood loss, acute    ED Discharge Orders    None       Tacy Learn, PA-C 05/29/18 1125    Elnora Morrison, MD 05/29/18 1556

## 2018-05-29 NOTE — Progress Notes (Signed)
[name] [id number]  Code Status: DNR  Admission Data: 05/29/2018 3:35 PM  Attending Provider: Lorin Mercy  BHA:[id number], Rexene Edison, DO  Consults/ Treatment Team: Treatment Team:  Lavena Bullion, DO  Lindsey Salazar is a 85 y.o. female patient admitted from ED awake, alert - oriented X 2 - no acute distress noted. VSS - Blood pressure 123/64, pulse 75, temperature 98.1 F (36.7 C), temperature source Oral, resp. rate 16, height 5\' 5"  (1.651 m), weight 47.6 kg, SpO2 97 %. no c/o shortness of breath, no c/o chest pain.  Orientation to room, and floor completed with information packet given to patient/family. Admission INP armband ID verified with patient/family, and in place.  SR up x 2, fall assessment complete, with patient and family able to verbalize understanding of risk associated with falls, and verbalized understanding to call nsg before up out of bed. Call light within reach, patient able to voice, and demonstrate understanding. Skin, clean-dry- intact without evidence of bruising, or skin tears.  No evidence of skin break down noted on exam.  ?  Will cont to eval and treat per MD orders.  Melonie Florida, RN  05/29/2018 3:35 PM

## 2018-05-29 NOTE — H&P (View-Only) (Signed)
Referring Provider: Dr. Karmen Bongo  Primary Care Physician:  Gayland Curry, DO Primary Gastroenterologist: unassigned   Reason for Consultation: anemia, melena, weakness   HPI: Lindsey Salazar is a 85 y.o. female with a history of dementia, hypertension, chronic kidney disease, ischemic heart disease, mitral valve prolapse.She was evaluated in the ED 5/1 2020 with weakness, frequent urination, mild lower abdominal pain and  home SBP 200.  WBC was mildly elevated.  An abdominal/pelvic CT without contrast identified market circumferential wall thickening within the gastric antrum and pyloric regions no evidence of perforation. Trace free fluid in the cul-de-sac.  Moderate stool burden in the colon. She was discharged home. She lives with her 41 husband. She developed dark diarrhea yesterday. She awakened this morning and fell to the ground, her husband found her on the floor incontinent of urine and dark tarry like stool. She was profusely sweaty. She had profound weakness as well. No abdominal pain. No nausea or vomiting. No fever. She takes ASA 71m QOD.  She was sent by ambulance to the ED for further evaluation.  ED course: WBC 11.6.  Hemoglobin 6.5 (on  5/6 hemoglobin was 12.1, on 5/1 hemoglobin was 15.7). Hematocrit 21.3.  MCV 104.9.  Platelet 131.  Sodium 146.  Potassium 4.8.  BUN 85 (up from 43 on 5/6).  Creatinine 1.82.  Alk phos 38.  Albumin 2.5.  AST 14.  ALT 13.  Total bilirubin 0.5.  CT of the head was negative. 1 unit of PRBCs ordered, not yet transfused.  She reported having a normal colonoscopy 10+ years ago.   Her son, Lindsey Salazar is present to assist with her history.   Past Medical History:  Diagnosis Date   Acute bronchitis    Benign neoplasm of uterus, part unspecified    Chronic ischemic heart disease, unspecified    Chronic kidney disease, stage II (mild)    Cough    Encounter for long-term (current) use of other medications    Fall 01/30/2016    Ganglion, unspecified    Hearing loss    Hyperlipidemia    Hypertension    Impacted cerumen    Light-headed feeling 09/25/2015   Macular degeneration (senile) of retina, unspecified    Mitral valve disorders(424.0)    Nonspecific abnormal electrocardiogram (ECG) (EKG)    Osteoporosis, unspecified    Other abnormal blood chemistry    Other emphysema (HCC)    Other malaise and fatigue    Palpitations    Senile cataract, unspecified    Senile purpura ([facility] 06/06/2015   Thrombocytopenia, unspecified ([facility]    Unspecified hearing loss     Past Surgical History:  Procedure Laterality Date   KNEE ARTHROSCOPY     right    Prior to Admission medications   Medication Sig Start Date End Date Taking? Authorizing Provider  amLODipine (NORVASC) 5 MG tablet TAKE 1 TABLET ONCE DAILY FOR BLOOD PRESSURE. Patient taking differently: Take 5 mg by mouth daily. FOR BLOOD PRESSURE. 04/08/18  Yes Reed, Tiffany L, DO  aspirin 81 MG tablet Take 81 mg by mouth every other day.    Yes [provider]  atorvastatin (LIPITOR) 10 MG tablet TAKE 1 TABLET ONCE DAILY TO LOWER CHOLESTEROL. Patient taking differently: Take 10 mg by mouth daily at 6 PM. TO LOWER CHOLESTEROL. 12/14/17  Yes Reed, Tiffany L, DO  Cholecalciferol (VITAMIN D3) 50 MCG (2000 UT) capsule Take 1 capsule (2,000 Units total) by mouth daily. 12/14/17  Yes Reed, Tiffany L, DO  donepezil (ARICEPT) 10 MG tablet TAKE ONE TABLET AT BEDTIME. Patient taking differently: Take 10 mg by mouth at bedtime.  04/08/18  Yes Reed, Tiffany L, DO  memantine (NAMENDA XR) 28 MG CP24 24 hr capsule TAKE 1 CAPSULE DAILY. Patient taking differently: Take 28 mg by mouth daily.  04/08/18  Yes Reed, Tiffany L, DO  nebivolol (BYSTOLIC) 5 MG tablet Take 1 tablet (5 mg total) by mouth daily. 12/21/17  Yes Reed, Tiffany L, DO  Nutritional Supplements (FEEDING SUPPLEMENT, BOOST BREEZE,) LIQD Take 1 Can by mouth daily. Patient taking differently: Take 1  Container by mouth daily.  12/19/17  Yes Reed, Tiffany L, DO  Specialty Vitamins Products (LIPOTRIAD VISION SUPPORT PO) Take 1 tablet by mouth daily.   Yes [provider]    Current Facility-Administered Medications  Medication Dose Route Frequency Provider Last Rate Last Dose   acetaminophen (TYLENOL) tablet 650 mg  650 mg Oral Q6H PRN Karmen Bongo, MD       Or   acetaminophen (TYLENOL) suppository 650 mg  650 mg Rectal Q6H PRN Karmen Bongo, MD       lactated ringers infusion   Intravenous Continuous Karmen Bongo, MD       [START ON 05/30/2018] nebivolol (BYSTOLIC) tablet 5 mg  5 mg Oral Daily Karmen Bongo, MD       ondansetron [facility]) tablet 4 mg  4 mg Oral Q6H PRN Karmen Bongo, MD       Or   ondansetron [facility]) injection 4 mg  4 mg Intravenous Q6H PRN Karmen Bongo, MD       pantoprazole (PROTONIX) injection 40 mg  40 mg Intravenous Lillia Mountain, MD   40 mg at 05/29/18 1342    Allergies as of 05/29/2018 - Review Complete 05/29/2018  Allergen Reaction Noted   Penicillins Nausea And Vomiting and Other (See Comments) 08/07/2010    Family History  Problem Relation Age of Onset   Stroke Brother    Cancer Sister        lung   Parkinsonism Father        Alzheimer's    Social History   Socioeconomic History   Marital status: Single    Spouse name: Not on file   Number of children: 4   Years of education: Not on file   Highest education level: Not on file  Occupational History    Employer: RETIRED  Social Needs   Financial resource strain: Not hard at all   Food insecurity:    Worry: Never true    Inability: Never true   Transportation needs:    Medical: No    Non-medical: No  Tobacco Use   Smoking status: Never Smoker   Smokeless tobacco: Never Used  Substance and Sexual Activity   Alcohol use: No   Drug use: No   Sexual activity: Not Currently  Lifestyle   Physical activity:    Days per week: 3 days     Minutes per session: 10 min   Stress: Not at all  Relationships   Social connections:    Talks on phone: Three times a week    Gets together: Once a week    Attends religious service: More than 4 times per year    Active member of club or organization: No    Attends meetings of clubs or organizations: Never    Relationship status: Married   Intimate partner violence:    Fear of current or ex partner: No  Emotionally abused: No    Physically abused: No    Forced sexual activity: No  Other Topics Concern   Not on file  Social History Narrative   Lives with husband.      Review of Systems: Gen: + sweats this am, no weight loss. CV: Denies chest pain, angina, palpitations, syncope, orthopnea, PND, peripheral edema, and claudication. Resp: Denies chest pain or palpitations. GI: See HPI. Denies vomiting blood, jaundice, and fecal incontinence.   Denies dysphagia or odynophagia. GU : Denies urinary burning or dysuria.  MS: Denies joint pain, + weakness. Derm: Denies rash or skin lesions. Psych: Denies depression or anxiety. Heme: Bruises easily. Neuro:  + numbness to her fingers.   Physical Exam: Vital signs in last 24 hours: Temp:  [97.2 F (36.2 C)-98.3 F (36.8 C)] 98.2 F (36.8 C) (05/09 1403) Pulse Rate:  [36-115] 76 (05/09 1403) Resp:  [7-24] 16 (05/09 1403) BP: (81-131)/(54-87) 107/64 (05/09 1403) SpO2:  [81 %-100 %] 100 % (05/09 1238) Weight:  [47.6 kg-49.9 kg] 47.6 kg (05/09 1238)   General:   Alert,  Well-developed, well-nourished, pleasant and cooperative in NAD Eyes:  Sclera clear, no icterus. Conjunctiva pink. Lungs:  Clear throughout to auscultation.   No wheezes, crackles, or rhonchi. Heart: 2/6 systolic murmur. Abdomen:  Soft,nontender, BS active,nonpalp mass or hsm.   Rectal:  Melenic stool in rectal vault Msk:  Symmetrical without gross deformities. . Pulses:  Normal pulses noted. Extremities:  Without clubbing or edema. Neurologic:  Alert and   oriented x3;  grossly normal neurologically. Skin: jaundice appearance Psych:  Alert and cooperative. Normal mood and affect.  Intake/Output from previous day: No intake/output data recorded. Intake/Output this shift: No intake/output data recorded.  Lab Results: Recent Labs    05/29/18 0741  WBC 11.6*  HGB 6.5*  HCT 21.3*  PLT 131*   BMET Recent Labs    05/29/18 0741  NA 146*  K 4.8  CL 115*  CO2 20*  GLUCOSE 148*  BUN 85*  CREATININE 1.82*  CALCIUM 8.5*   LFT Recent Labs    05/29/18 0741  PROT 4.9*  ALBUMIN 2.5*  AST 14*  ALT 13  ALKPHOS 38  BILITOT 0.5   PT/INR Recent Labs    05/29/18 0741  LABPROT 15.3*  INR 1.2   Hepatitis Panel No results for input(s): HEPBSAG, HCVAB, HEPAIGM, HEPBIGM in the last 72 hours.    Studies/Results: Ct Head Wo Contrast  Result Date: 05/29/2018 CLINICAL DATA:  Pt coming from home arrives via ems. Pt awoke this morning needing to have a BM and got very weak in her legs upon ambulating to the bathroom with walker. Pt BM was very dark. Pts husband and son helped her to the ground. HX of d.*comment was truncated*Head trauma, ataxia EXAM: CT HEAD WITHOUT CONTRAST CT CERVICAL SPINE WITHOUT CONTRAST TECHNIQUE: Multidetector CT imaging of the head and cervical spine was performed following the standard protocol without intravenous contrast. Multiplanar CT image reconstructions of the cervical spine were also generated. COMPARISON:  Head CT 05/21/2018 FINDINGS: CT HEAD FINDINGS Brain: No acute intracranial hemorrhage. No focal mass lesion. No CT evidence of acute infarction. No midline shift or mass effect. No hydrocephalus. Basilar cisterns are patent. There are periventricular and subcortical white matter hypodensities. Generalized cortical atrophy. Vascular: No hyperdense vessel or unexpected calcification. Skull: Normal. Negative for fracture or focal lesion. Sinuses/Orbits: Paranasal sinuses and mastoid air cells are clear. Orbits  are clear. Other: None. CT CERVICAL SPINE FINDINGS Alignment:  Normal alignment of the cervical vertebral bodies. Skull base and vertebrae: Normal craniocervical junction. No loss of vertebral body height or disc height. Normal facet articulation. No evidence of fracture. Soft tissues and spinal canal: No prevertebral soft tissue swelling. No perispinal or epidural hematoma. Disc levels: Mild endplate spurring. Disc space narrowing at C5-C6. Upper chest: Clear Other: None IMPRESSION: 1. No acute intracranial findings. 2. Atrophy and white matter microvascular disease. 3. No cervical spine fracture. 4. Moderate disc osteophytic disease. Electronically Signed   By: Suzy Bouchard M.D.   On: 05/29/2018 08:43   Ct Cervical Spine Wo Contrast  Result Date: 05/29/2018 CLINICAL DATA:  Pt coming from home arrives via ems. Pt awoke this morning needing to have a BM and got very weak in her legs upon ambulating to the bathroom with walker. Pt BM was very dark. Pts husband and son helped her to the ground. HX of d.*comment was truncated*Head trauma, ataxia EXAM: CT HEAD WITHOUT CONTRAST CT CERVICAL SPINE WITHOUT CONTRAST TECHNIQUE: Multidetector CT imaging of the head and cervical spine was performed following the standard protocol without intravenous contrast. Multiplanar CT image reconstructions of the cervical spine were also generated. COMPARISON:  Head CT 05/21/2018 FINDINGS: CT HEAD FINDINGS Brain: No acute intracranial hemorrhage. No focal mass lesion. No CT evidence of acute infarction. No midline shift or mass effect. No hydrocephalus. Basilar cisterns are patent. There are periventricular and subcortical white matter hypodensities. Generalized cortical atrophy. Vascular: No hyperdense vessel or unexpected calcification. Skull: Normal. Negative for fracture or focal lesion. Sinuses/Orbits: Paranasal sinuses and mastoid air cells are clear. Orbits are clear. Other: None. CT CERVICAL SPINE FINDINGS Alignment: Normal  alignment of the cervical vertebral bodies. Skull base and vertebrae: Normal craniocervical junction. No loss of vertebral body height or disc height. Normal facet articulation. No evidence of fracture. Soft tissues and spinal canal: No prevertebral soft tissue swelling. No perispinal or epidural hematoma. Disc levels: Mild endplate spurring. Disc space narrowing at C5-C6. Upper chest: Clear Other: None IMPRESSION: 1. No acute intracranial findings. 2. Atrophy and white matter microvascular disease. 3. No cervical spine fracture. 4. Moderate disc osteophytic disease. Electronically Signed   By: Suzy Bouchard M.D.   On: 05/29/2018 08:43   Dg Chest Port 1 View  Result Date: 05/29/2018 CLINICAL DATA:  Pt awoke this morning needing to have a BM and got very weak in her legs upon ambulating to the bathroom with walker. H/o dementia.weakness EXAM: PORTABLE CHEST 1 VIEW COMPARISON:  05/21/2018 FINDINGS: Normal mediastinum and cardiac silhouette. Lungs are hyperinflated. Normal pulmonary vasculature. No evidence of effusion, infiltrate, or pneumothorax. No acute bony abnormality. IMPRESSION: hyperinflated lungs.  No acute findings. Electronically Signed   By: Suzy Bouchard M.D.   On: 05/29/2018 08:10    IMPRESSION/PLAN  1. 85 y.o. female with UGI bleed/melena with associated anemia. No prior history of anemia. Not on anticoagulation.Hg 6.5 << 12.1 on 5/6, Hg 15.7 on 5/1. BUN 85 with baseline BUN 35. She is hemodynamically stable.  -hold ASA -will need EGD -transfuse PRBCs as ordered by ED physician -repeat H/H after transfusion -NPO for now -monitor for further GI bleeding/melena  2. Hx CKD  3. CAD, Murmur.   4. Dementia  Noralyn Pick  05/29/2018, 2:28 PM

## 2018-05-30 ENCOUNTER — Encounter (HOSPITAL_COMMUNITY): Payer: Self-pay | Admitting: *Deleted

## 2018-05-30 ENCOUNTER — Observation Stay (HOSPITAL_COMMUNITY): Payer: Medicare Other | Admitting: Certified Registered"

## 2018-05-30 ENCOUNTER — Encounter (HOSPITAL_COMMUNITY)
Admission: EM | Disposition: A | Discharge status: Home Health | Payer: Self-pay | Source: Home / Self Care | Attending: Family Medicine

## 2018-05-30 DIAGNOSIS — E785 Hyperlipidemia, unspecified: Secondary | ICD-10-CM | POA: Diagnosis present

## 2018-05-30 DIAGNOSIS — N182 Chronic kidney disease, stage 2 (mild): Secondary | ICD-10-CM | POA: Diagnosis present

## 2018-05-30 DIAGNOSIS — Z823 Family history of stroke: Secondary | ICD-10-CM | POA: Diagnosis not present

## 2018-05-30 DIAGNOSIS — K921 Melena: Secondary | ICD-10-CM | POA: Diagnosis not present

## 2018-05-30 DIAGNOSIS — Y92009 Unspecified place in unspecified non-institutional (private) residence as the place of occurrence of the external cause: Secondary | ICD-10-CM | POA: Diagnosis not present

## 2018-05-30 DIAGNOSIS — F0280 Dementia in other diseases classified elsewhere without behavioral disturbance: Secondary | ICD-10-CM | POA: Diagnosis present

## 2018-05-30 DIAGNOSIS — Z66 Do not resuscitate: Secondary | ICD-10-CM | POA: Diagnosis present

## 2018-05-30 DIAGNOSIS — W19XXXA Unspecified fall, initial encounter: Secondary | ICD-10-CM | POA: Diagnosis present

## 2018-05-30 DIAGNOSIS — M810 Age-related osteoporosis without current pathological fracture: Secondary | ICD-10-CM | POA: Diagnosis present

## 2018-05-30 DIAGNOSIS — K259 Gastric ulcer, unspecified as acute or chronic, without hemorrhage or perforation: Secondary | ICD-10-CM

## 2018-05-30 DIAGNOSIS — D62 Acute posthemorrhagic anemia: Secondary | ICD-10-CM | POA: Diagnosis present

## 2018-05-30 DIAGNOSIS — Z880 Allergy status to penicillin: Secondary | ICD-10-CM | POA: Diagnosis not present

## 2018-05-30 DIAGNOSIS — Z7982 Long term (current) use of aspirin: Secondary | ICD-10-CM | POA: Diagnosis not present

## 2018-05-30 DIAGNOSIS — K2970 Gastritis, unspecified, without bleeding: Secondary | ICD-10-CM | POA: Diagnosis not present

## 2018-05-30 DIAGNOSIS — J438 Other emphysema: Secondary | ICD-10-CM | POA: Diagnosis present

## 2018-05-30 DIAGNOSIS — Z1159 Encounter for screening for other viral diseases: Secondary | ICD-10-CM | POA: Diagnosis not present

## 2018-05-30 DIAGNOSIS — Z79899 Other long term (current) drug therapy: Secondary | ICD-10-CM | POA: Diagnosis not present

## 2018-05-30 DIAGNOSIS — F0390 Unspecified dementia without behavioral disturbance: Secondary | ICD-10-CM | POA: Diagnosis present

## 2018-05-30 DIAGNOSIS — N179 Acute kidney failure, unspecified: Secondary | ICD-10-CM | POA: Diagnosis not present

## 2018-05-30 DIAGNOSIS — N184 Chronic kidney disease, stage 4 (severe): Secondary | ICD-10-CM | POA: Diagnosis present

## 2018-05-30 DIAGNOSIS — K254 Chronic or unspecified gastric ulcer with hemorrhage: Secondary | ICD-10-CM | POA: Diagnosis present

## 2018-05-30 DIAGNOSIS — R739 Hyperglycemia, unspecified: Secondary | ICD-10-CM | POA: Diagnosis present

## 2018-05-30 DIAGNOSIS — R197 Diarrhea, unspecified: Secondary | ICD-10-CM | POA: Diagnosis present

## 2018-05-30 DIAGNOSIS — G301 Alzheimer's disease with late onset: Secondary | ICD-10-CM | POA: Diagnosis present

## 2018-05-30 DIAGNOSIS — K922 Gastrointestinal hemorrhage, unspecified: Secondary | ICD-10-CM | POA: Diagnosis not present

## 2018-05-30 DIAGNOSIS — H9190 Unspecified hearing loss, unspecified ear: Secondary | ICD-10-CM | POA: Diagnosis present

## 2018-05-30 DIAGNOSIS — D696 Thrombocytopenia, unspecified: Secondary | ICD-10-CM | POA: Diagnosis present

## 2018-05-30 DIAGNOSIS — Z809 Family history of malignant neoplasm, unspecified: Secondary | ICD-10-CM | POA: Diagnosis not present

## 2018-05-30 DIAGNOSIS — Z820 Family history of epilepsy and other diseases of the nervous system: Secondary | ICD-10-CM | POA: Diagnosis not present

## 2018-05-30 DIAGNOSIS — I472 Ventricular tachycardia: Secondary | ICD-10-CM | POA: Diagnosis present

## 2018-05-30 HISTORY — PX: ESOPHAGOGASTRODUODENOSCOPY (EGD) WITH PROPOFOL: SHX5813

## 2018-05-30 HISTORY — PX: BIOPSY: SHX5522

## 2018-05-30 LAB — CBC
HCT: 19.4 % — ABNORMAL LOW (ref 36.0–46.0)
HCT: 25.9 % — ABNORMAL LOW (ref 36.0–46.0)
Hemoglobin: 6.3 g/dL — CL (ref 12.0–15.0)
Hemoglobin: 8.6 g/dL — ABNORMAL LOW (ref 12.0–15.0)
MCH: 32.3 pg (ref 26.0–34.0)
MCH: 32 pg (ref 26.0–34.0)
MCHC: 32.5 g/dL (ref 30.0–36.0)
MCHC: 33.2 g/dL (ref 30.0–36.0)
MCV: 99.5 fL (ref 80.0–100.0)
MCV: 96.3 fL (ref 80.0–100.0)
Platelets: 106 10*3/uL — ABNORMAL LOW (ref 150–400)
Platelets: 115 10*3/uL — ABNORMAL LOW (ref 150–400)
RBC: 1.95 MIL/uL — ABNORMAL LOW (ref 3.87–5.11)
RBC: 2.69 MIL/uL — ABNORMAL LOW (ref 3.87–5.11)
RDW: 14 % (ref 11.5–15.5)
RDW: 15.9 % — ABNORMAL HIGH (ref 11.5–15.5)
WBC: 9.6 10*3/uL (ref 4.0–10.5)
WBC: 7.9 10*3/uL (ref 4.0–10.5)
nRBC: 0.2 % (ref 0.0–0.2)
nRBC: 0.5 % — ABNORMAL HIGH (ref 0.0–0.2)

## 2018-05-30 LAB — BASIC METABOLIC PANEL
Anion gap: 7 (ref 5–15)
BUN: 61 mg/dL — ABNORMAL HIGH (ref 8–23)
CO2: 22 mmol/L (ref 22–32)
Calcium: 8 mg/dL — ABNORMAL LOW (ref 8.9–10.3)
Chloride: 116 mmol/L — ABNORMAL HIGH (ref 98–111)
Creatinine, Ser: 1.79 mg/dL — ABNORMAL HIGH (ref 0.44–1.00)
GFR calc Af Amer: 30 mL/min — ABNORMAL LOW (ref >60)
GFR calc non Af Amer: 26 mL/min — ABNORMAL LOW (ref >60)
Glucose, Bld: 96 mg/dL (ref 70–99)
Potassium: 4.9 mmol/L (ref 3.5–5.1)
Sodium: 145 mmol/L (ref 135–145)

## 2018-05-30 LAB — PREPARE RBC (CROSSMATCH)

## 2018-05-30 SURGERY — ESOPHAGOGASTRODUODENOSCOPY (EGD) WITH PROPOFOL
Anesthesia: Monitor Anesthesia Care

## 2018-05-30 MED ORDER — LACTATED RINGERS IV SOLN
INTRAVENOUS | Status: DC
  Administered 2018-05-30: 10:00:00 via INTRAVENOUS

## 2018-05-30 MED ORDER — SODIUM CHLORIDE 0.9 % IV SOLN: INTRAVENOUS | Status: DC

## 2018-05-30 MED ORDER — PANTOPRAZOLE SODIUM 40 MG PO TBEC
40.0000 mg | DELAYED_RELEASE_TABLET | Freq: Two times a day (BID) | ORAL | Status: DC
  Administered 2018-05-30 – 2018-05-31 (×2): 40 mg via ORAL
  Filled 2018-05-30 (×2): qty 1

## 2018-05-30 MED ORDER — PROPOFOL 500 MG/50ML IV EMUL
INTRAVENOUS | Status: DC | PRN
  Administered 2018-05-30: 75 ug/kg/min via INTRAVENOUS

## 2018-05-30 MED ORDER — ATORVASTATIN CALCIUM 10 MG PO TABS
10.0000 mg | ORAL_TABLET | Freq: Every day | ORAL | Status: DC
  Administered 2018-05-30: 10 mg via ORAL
  Filled 2018-05-30 (×2): qty 1

## 2018-05-30 MED ORDER — DONEPEZIL HCL 10 MG PO TABS
10.0000 mg | ORAL_TABLET | Freq: Every day | ORAL | Status: DC
  Administered 2018-05-30: 10 mg via ORAL
  Filled 2018-05-30: qty 1

## 2018-05-30 MED ORDER — SODIUM CHLORIDE 0.9% IV SOLUTION
Freq: Once | INTRAVENOUS | Status: AC
  Administered 2018-05-30: 06:00:00 via INTRAVENOUS

## 2018-05-30 MED ORDER — MEMANTINE HCL ER 28 MG PO CP24
28.0000 mg | ORAL_CAPSULE | Freq: Every day | ORAL | Status: DC
  Administered 2018-05-30 – 2018-05-31 (×2): 28 mg via ORAL
  Filled 2018-05-30 (×2): qty 1

## 2018-05-30 MED ORDER — LIDOCAINE HCL (CARDIAC) PF 100 MG/5ML IV SOSY
PREFILLED_SYRINGE | INTRAVENOUS | Status: DC | PRN
  Administered 2018-05-30: 60 mg via INTRATRACHEAL

## 2018-05-30 SURGICAL SUPPLY — 15 items

## 2018-05-30 NOTE — Progress Notes (Signed)
PROGRESS NOTE    Lindsey Salazar  NWG:[id number] DOB: 10/15/1932 DOA: 05/29/2018 PCP: Gayland Curry, DO      Brief Narrative:  [name] is a 85 y.o. F with dementia, home dwelling, HTN, CKD IV baseline Cr 1.8, and thrombocytopenia who presents with syncopal episode after melena.  Last Friday, she was here with markedly elevated BP.  She was having abdominal pain.  The evaluation was fairly unremarkable.  She was restless overnight.  She tried to get up about 615 this AM.  Family checked on her and found her in the floor in a pool of sweat.  She had bowel and bladder incontinence - bowel contents were dark and tarry.  She was very weak.     Assessment & Plan:  Melena Upper GI bleeding EGD 5/10 showed gastric ulcer, concern for malignancy.  No high risk features. -Continue PPI switch to PO -Advance to full liquids -Trend serial H/H  -Hold home aspirin  ACute blood loss anemia Hgb 14 at baseline, <6 here.  Transfused 2 units.   -Trend H/H serially -Transfuse for Hgb <7 /gdL  Hypertension BP soft -Continue nebivolol -Hold home amlodipine -Continue atorvastatin  Dementia -Restart donepezil, memantine  CKD IV Creatinine stable relative to baseline  Thrombocytopenia Mild, stable relative to baseline         MDM and disposition: The below labs and imaging reports were reviewed and summarized above.  Medication management as above.  The patient was admitted with melena, found to have gastric ulcer.  No clincal bleeding during the day today, will monitor overnight, trend Hgb, trasnfuse as necessary.         DVT prophylaxis: SCDs Code Status: DO NOT RESUSCITATE Family Communication: Son at bedside    Consultants:   GI  Procedures:   EGD 5/10 Findings:      The examined esophagus was normal.      A large, edematous, erythematous, and ulcerated mass lesion with no       bleeding was found in the gastric antrum and in the prepyloric  region of       the stomach. This extended to the pylorus. The pylorus was easily       traversed. There were 2 distinct, large, cratered ulcers without high       grade stigmata of bleeding. Several mucosal biopsies were taken from the       mass and the ulcer edges with a cold forceps for histology. Estimated       blood loss was minimal.      The cardia, gastric fundus and gastric body were normal.      Localized mildly erythematous mucosa without active bleeding and with no       stigmata of bleeding was found in the duodenal bulb.      The second portion of the duodenum was normal. Impression:               - Normal esophagus.                           - Large edematous, erythematous, ulcerated lesion                            in the gastric antrum and in the prepyloric region  of the stomach. This was extensively biopsied.                           - Normal cardia, gastric fundus and gastric body.                           - Erythematous duodenopathy.                           - Normal second portion of the duodenum. Recommendation:           - Return patient to hospital ward for ongoing care.                           - Full liquid diet today.                           - Use Protonix (pantoprazole) 40 mg PO BID.                           - Await pathology results. Placed for rush                            evaluation.                           - Continue serial Hgb/Hct checks with ongoing pRBC                            transfusions per protocol.                           - Pending pathology results, if malignant, plan for                            CT scan (computed tomography) of the chest as                            appropriate for staging.                           - General Surgery consult pending pathology results.                           - While there has been no recent complaint of                            nausea, emesis, early satiety, or  weight loss, the                            size and location involving the pylorus is                            certainly at elevated risk for eventual obstructive  type symptoms.    Antimicrobials:   None    Subjective: No pain, no more melena.  No vomiting.  No confusion, fever.  Objective: Vitals:   05/30/18 1125 05/30/18 1130 05/30/18 1135 05/30/18 1537  BP: (!) 94/59 (!) 108/58 107/62 128/71  Pulse: 69 66 66 67  Resp: 19 16 16 14   Temp:    99.1 F (37.3 C)  TempSrc:    Oral  SpO2: 98% 98% 98% 100%  Weight:      Height:        Intake/Output Summary (Last 24 hours) at 05/30/2018 1609 Last data filed at 05/30/2018 7824 Gross per 24 hour  Intake 1574.2 ml  Output -  Net 1574.2 ml   Filed Weights   05/29/18 0737 05/29/18 1238 05/30/18 0608  Weight: 49.9 kg 47.6 kg 50.7 kg    Examination: General appearance: thin adult female, alert and in no acute distress.   HEENT: Anicteric, conjunctiva pink, lids and lashes normal. No nasal deformity, discharge, epistaxis.  Lips moist.   Skin: Warm and dry.  no jaundice.  No suspicious rashes or lesions. Cardiac: RRR, nl S1-S2, no murmurs appreciated.  Capillary refill is brisk.  JVP not visible.  No LE edema.  Radial pulses 2+ and symmetric. Respiratory: Normal respiratory rate and rhythm.  CTAB without rales or wheezes. Abdomen: Abdomen soft.  No TTP. No ascites, distension, hepatosplenomegaly.   MSK: No deformities or effusions. Neuro: Awake and alert.  EOMI, moves all extremities. Speech fluent.    Psych: Sensorium intact and responding to questions, attention normal. Affect pleasant.  Judgment and insight appear impaired by dementia.    Data Reviewed: I have personally reviewed following labs and imaging studies:  CBC: Recent Labs  Lab 05/26/18 1001 05/29/18 0741 05/29/18 2034 05/30/18 0351  WBC 6.0 11.6* 11.3* 9.6  NEUTROABS 4,194 9.7*  --   --   HGB 12.1 6.5* 7.0* 6.3*  HCT 36.0  21.3* 21.3* 19.4*  MCV 95.0 104.9* 100.5* 99.5  PLT 142 131* 111* 235*   Basic Metabolic Panel: Recent Labs  Lab 05/26/18 1001 05/29/18 0741 05/30/18 0351  NA 140 146* 145  K 4.6 4.8 4.9  CL 106 115* 116*  CO2 27 20* 22  GLUCOSE 122 148* 96  BUN 43* 85* 61*  CREATININE 1.67* 1.82* 1.79*  CALCIUM 8.7 8.5* 8.0*   GFR: Estimated Creatinine Clearance: 19.1 mL/min (A) (by C-G formula based on SCr of 1.79 mg/dL (H)). Liver Function Tests: Recent Labs  Lab 05/26/18 1001 05/29/18 0741  AST 15 14*  ALT 14 13  ALKPHOS  --  38  BILITOT 0.5 0.5  PROT 5.8* 4.9*  ALBUMIN  --  2.5*   Recent Labs  Lab 05/26/18 1001  LIPASE 140*   No results for input(s): AMMONIA in the last 168 hours. Coagulation Profile: Recent Labs  Lab 05/29/18 0741  INR 1.2   Cardiac Enzymes: No results for input(s): CKTOTAL, CKMB, CKMBINDEX, TROPONINI in the last 168 hours. BNP (last 3 results) No results for input(s): PROBNP in the last 8760 hours. HbA1C: No results for input(s): HGBA1C in the last 72 hours. CBG: No results for input(s): GLUCAP in the last 168 hours. Lipid Profile: No results for input(s): CHOL, HDL, LDLCALC, TRIG, CHOLHDL, LDLDIRECT in the last 72 hours. Thyroid Function Tests: No results for input(s): TSH, T4TOTAL, FREET4, T3FREE, THYROIDAB in the last 72 hours. Anemia Panel: No results for input(s): VITAMINB12, FOLATE, FERRITIN, TIBC, IRON, RETICCTPCT in the last 72 hours. Urine  analysis:    Component Value Date/Time   COLORURINE YELLOW 05/21/2018 2307   APPEARANCEUR CLEAR 05/21/2018 2307   LABSPEC 1.013 05/21/2018 2307   PHURINE 7.0 05/21/2018 2307   GLUCOSEU NEGATIVE 05/21/2018 2307   HGBUR NEGATIVE 05/21/2018 2307   BILIRUBINUR NEGATIVE 05/21/2018 2307   BILIRUBINUR neg 07/02/2017 1513   KETONESUR 5 (A) 05/21/2018 2307   PROTEINUR 100 (A) 05/21/2018 2307   UROBILINOGEN 0.2 07/02/2017 1513   NITRITE NEGATIVE 05/21/2018 2307   LEUKOCYTESUR NEGATIVE 05/21/2018 2307    Sepsis Labs: @LABRCNTIP (procalcitonin:4,lacticacidven:4)  ) Recent Results (from the past 240 hour(s))  Urine culture     Status: Abnormal   Collection Time: 05/21/18 11:09 PM  Result Value Ref Range Status   Specimen Description URINE, CLEAN CATCH  Final   Special Requests NONE  Final   Culture (A)  Final    <10,000 COLONIES/mL INSIGNIFICANT GROWTH Performed at [facility], [address] [place], [place] [address]    Report Status 05/23/2018 FINAL  Final  SARS Coronavirus 2 (CEPHEID - Performed in [place] hospital lab), Hosp Order     Status: None   Collection Time: 05/29/18  9:22 AM  Result Value Ref Range Status   SARS Coronavirus 2 NEGATIVE NEGATIVE Final    Comment: (NOTE) If result is NEGATIVE SARS-CoV-2 target nucleic acids are NOT DETECTED. The SARS-CoV-2 RNA is generally detectable in upper and lower  respiratory specimens during the acute phase of infection. The lowest  concentration of SARS-CoV-2 viral copies this assay can detect is 250  copies / mL. A negative result does not preclude SARS-CoV-2 infection  and should not be used as the sole basis for treatment or other  patient management decisions.  A negative result may occur with  improper specimen collection / handling, submission of specimen other  than nasopharyngeal swab, presence of viral mutation(s) within the  areas targeted by this assay, and inadequate number of viral copies  (<250 copies / mL). A negative result must be combined with clinical  observations, patient history, and epidemiological information. If result is POSITIVE SARS-CoV-2 target nucleic acids are DETECTED. The SARS-CoV-2 RNA is generally detectable in upper and lower  respiratory specimens dur ing the acute phase of infection.  Positive  results are indicative of active infection with SARS-CoV-2.  Clinical  correlation with patient history and other diagnostic information is  necessary to determine patient infection  status.  Positive results do  not rule out bacterial infection or co-infection with other viruses. If result is PRESUMPTIVE POSTIVE SARS-CoV-2 nucleic acids MAY BE PRESENT.   A presumptive positive result was obtained on the submitted specimen  and confirmed on repeat testing.  While 2019 novel coronavirus  (SARS-CoV-2) nucleic acids may be present in the submitted sample  additional confirmatory testing may be necessary for epidemiological  and / or clinical management purposes  to differentiate between  SARS-CoV-2 and other Sarbecovirus currently known to infect humans.  If clinically indicated additional testing with an alternate test  methodology [phone]) is advised. The SARS-CoV-2 RNA is generally  detectable in upper and lower respiratory sp ecimens during the acute  phase of infection. The expected result is Negative. Fact Sheet for Patients:  StrictlyIdeas.no Fact Sheet for Healthcare Providers: BankingDealers.co.za This test is not yet approved or cleared by the [place] FDA and has been authorized for detection and/or diagnosis of SARS-CoV-2 by FDA under an Emergency Use Authorization (EUA).  This EUA will remain in effect (meaning this test  can be used) for the duration of the COVID-19 declaration under Section 564(b)(1) of the Act, 21 U.S.C. section 360bbb-3(b)(1), unless the authorization is terminated or revoked sooner. Performed at [facility], [place] [address], [place], [place] [address]          Radiology Studies: Ct Head Wo Contrast  Result Date: 05/29/2018 CLINICAL DATA:  Pt coming from home arrives via ems. Pt awoke this morning needing to have a BM and got very weak in her legs upon ambulating to the bathroom with walker. Pt BM was very dark. Pts husband and son helped her to the ground. HX of d.*comment was truncated*Head trauma, ataxia EXAM: CT HEAD WITHOUT CONTRAST CT CERVICAL SPINE WITHOUT CONTRAST  TECHNIQUE: Multidetector CT imaging of the head and cervical spine was performed following the standard protocol without intravenous contrast. Multiplanar CT image reconstructions of the cervical spine were also generated. COMPARISON:  Head CT 05/21/2018 FINDINGS: CT HEAD FINDINGS Brain: No acute intracranial hemorrhage. No focal mass lesion. No CT evidence of acute infarction. No midline shift or mass effect. No hydrocephalus. Basilar cisterns are patent. There are periventricular and subcortical white matter hypodensities. Generalized cortical atrophy. Vascular: No hyperdense vessel or unexpected calcification. Skull: Normal. Negative for fracture or focal lesion. Sinuses/Orbits: Paranasal sinuses and mastoid air cells are clear. Orbits are clear. Other: None. CT CERVICAL SPINE FINDINGS Alignment: Normal alignment of the cervical vertebral bodies. Skull base and vertebrae: Normal craniocervical junction. No loss of vertebral body height or disc height. Normal facet articulation. No evidence of fracture. Soft tissues and spinal canal: No prevertebral soft tissue swelling. No perispinal or epidural hematoma. Disc levels: Mild endplate spurring. Disc space narrowing at C5-C6. Upper chest: Clear Other: None IMPRESSION: 1. No acute intracranial findings. 2. Atrophy and white matter microvascular disease. 3. No cervical spine fracture. 4. Moderate disc osteophytic disease. Electronically Signed   By: Suzy Bouchard M.D.   On: 05/29/2018 08:43   Ct Cervical Spine Wo Contrast  Result Date: 05/29/2018 CLINICAL DATA:  Pt coming from home arrives via ems. Pt awoke this morning needing to have a BM and got very weak in her legs upon ambulating to the bathroom with walker. Pt BM was very dark. Pts husband and son helped her to the ground. HX of d.*comment was truncated*Head trauma, ataxia EXAM: CT HEAD WITHOUT CONTRAST CT CERVICAL SPINE WITHOUT CONTRAST TECHNIQUE: Multidetector CT imaging of the head and cervical spine  was performed following the standard protocol without intravenous contrast. Multiplanar CT image reconstructions of the cervical spine were also generated. COMPARISON:  Head CT 05/21/2018 FINDINGS: CT HEAD FINDINGS Brain: No acute intracranial hemorrhage. No focal mass lesion. No CT evidence of acute infarction. No midline shift or mass effect. No hydrocephalus. Basilar cisterns are patent. There are periventricular and subcortical white matter hypodensities. Generalized cortical atrophy. Vascular: No hyperdense vessel or unexpected calcification. Skull: Normal. Negative for fracture or focal lesion. Sinuses/Orbits: Paranasal sinuses and mastoid air cells are clear. Orbits are clear. Other: None. CT CERVICAL SPINE FINDINGS Alignment: Normal alignment of the cervical vertebral bodies. Skull base and vertebrae: Normal craniocervical junction. No loss of vertebral body height or disc height. Normal facet articulation. No evidence of fracture. Soft tissues and spinal canal: No prevertebral soft tissue swelling. No perispinal or epidural hematoma. Disc levels: Mild endplate spurring. Disc space narrowing at C5-C6. Upper chest: Clear Other: None IMPRESSION: 1. No acute intracranial findings. 2. Atrophy and white matter microvascular disease. 3. No cervical spine fracture. 4. Moderate  disc osteophytic disease. Electronically Signed   By: Suzy Bouchard M.D.   On: 05/29/2018 08:43   Dg Chest Port 1 View  Result Date: 05/29/2018 CLINICAL DATA:  Pt awoke this morning needing to have a BM and got very weak in her legs upon ambulating to the bathroom with walker. H/o dementia.weakness EXAM: PORTABLE CHEST 1 VIEW COMPARISON:  05/21/2018 FINDINGS: Normal mediastinum and cardiac silhouette. Lungs are hyperinflated. Normal pulmonary vasculature. No evidence of effusion, infiltrate, or pneumothorax. No acute bony abnormality. IMPRESSION: hyperinflated lungs.  No acute findings. Electronically Signed   By: Suzy Bouchard M.D.    On: 05/29/2018 08:10        Scheduled Meds: . nebivolol  5 mg Oral Daily  . pantoprazole (PROTONIX) IV  40 mg Intravenous Q12H   Continuous Infusions:   LOS: 0 days    Time spent: 25 minutes    Edwin Dada, MD [facility] 05/30/2018, 4:09 PM     Please page through [place]:  [email or web address] Password [email or web address] If 7PM-7AM, please contact night-coverage

## 2018-05-30 NOTE — Anesthesia Procedure Notes (Signed)
Procedure Name: MAC Date/Time: 05/30/2018 11:03 AM Performed by: Oletta Lamas, CRNA Pre-anesthesia Checklist: Patient identified, Emergency Drugs available, Suction available, Patient being monitored and Timeout performed Patient Re-evaluated:Patient Re-evaluated prior to induction Oxygen Delivery Method: Nasal cannula

## 2018-05-30 NOTE — Op Note (Signed)
[facility] Patient Name: Lindsey Salazar Procedure Date : 05/30/2018 MRN: [id number] Attending MD: Gerrit Heck , MD Date of Birth: 01/29/1932 CSN: [id number] Age: 85 Admit Type: Inpatient Procedure:                Upper GI endoscopy Indications:              Acute post hemorrhagic anemia, Melena, Abnormal CT                            of the GI tract                           85 yo female presents with sympomatic anemia with                            admission Hgb 6.5 (from 12.1 3 days prior and 15 7                            days prior). Recent CT notable for gastric wall                            thickening and moderate stool retention. Admission                            labs also notable for BUN/creatinine 85/1.8 (BUN                            previously 43 and more historically in the 30s with                            baseline creatinine ~1.7). Transfused 2U pRBCs on                            arrival, with Hgb 7, then down to 6.3 again this                            AM, prompting another 1U pRBC transfusion. Providers:                Gerrit Heck, MD, Vista Lawman, RN, Marguerita Merles, Technician, Laverda Sorenson, Technician,                            Gala Lewandowsky, CRNA Referring MD:              Medicines:                Monitored Anesthesia Care Complications:            No immediate complications. Estimated Blood Loss:     Estimated blood loss was minimal. Procedure:                Pre-Anesthesia Assessment:                           -  Prior to the procedure, a History and Physical                            was performed, and patient medications and                            allergies were reviewed. The patient's tolerance of                            previous anesthesia was also reviewed. The risks                            and benefits of the procedure and the sedation                            options and  risks were discussed with the patient.                            All questions were answered, and informed consent                            was obtained. Prior Anticoagulants: The patient has                            taken no previous anticoagulant or antiplatelet                            agents. ASA Grade Assessment: III - A patient with                            severe systemic disease. After reviewing the risks                            and benefits, the patient was deemed in                            satisfactory condition to undergo the procedure.                           After obtaining informed consent, the endoscope was                            passed under direct vision. Throughout the                            procedure, the patient's blood pressure, pulse, and                            oxygen saturations were monitored continuously. The                            [id number]-H190 ([id number]) Olympus gastroscope was  introduced through the mouth, and advanced to the                            second part of duodenum. The upper GI endoscopy was                            accomplished without difficulty. The patient                            tolerated the procedure well. Scope In: Scope Out: Findings:      The examined esophagus was normal.      A large, edematous, erythematous, and ulcerated mass lesion with no       bleeding was found in the gastric antrum and in the prepyloric region of       the stomach. This extended to the pylorus. The pylorus was easily       traversed. There were 2 distinct, large, cratered ulcers without high       grade stigmata of bleeding. Several mucosal biopsies were taken from the       mass and the ulcer edges with a cold forceps for histology. Estimated       blood loss was minimal.      The cardia, gastric fundus and gastric body were normal.      Localized mildly erythematous mucosa without active bleeding and  with no       stigmata of bleeding was found in the duodenal bulb.      The second portion of the duodenum was normal. Impression:               - Normal esophagus.                           - Large edematous, erythematous, ulcerated lesion                            in the gastric antrum and in the prepyloric region                            of the stomach. This was extensively biopsied.                           - Normal cardia, gastric fundus and gastric body.                           - Erythematous duodenopathy.                           - Normal second portion of the duodenum. Recommendation:           - Return patient to hospital ward for ongoing care.                           - Full liquid diet today.                           - Use Protonix (pantoprazole) 40 mg PO BID.                           -  Await pathology results. Placed for rush                            evaluation.                           - Continue serial Hgb/Hct checks with ongoing pRBC                            transfusions per protocol.                           - Pending pathology results, if malignant, plan for                            CT scan (computed tomography) of the chest as                            appropriate for staging.                           - General Surgery consult pending pathology results.                           - While there has been no recent complaint of                            nausea, emesis, early satiety, or weight loss, the                            size and location involving the pylorus is                            certainly at elevated risk for eventual obstructive                            type symptoms. Procedure Code(s):        --- Professional ---                           [phone], Esophagogastroduodenoscopy, flexible,                            transoral; with biopsy, single or multiple Diagnosis Code(s):        --- Professional ---                            D49.0, Neoplasm of unspecified behavior of                            digestive system                           K31.89, Other diseases of stomach and duodenum  D62, Acute posthemorrhagic anemia                           K92.1, Melena (includes Hematochezia)                           R93.3, Abnormal findings on diagnostic imaging of                            other parts of digestive tract CPT copyright 2019 American Medical Association. All rights reserved. The codes documented in this report are preliminary and upon coder review may  be revised to meet current compliance requirements. Gerrit Heck, MD 05/30/2018 11:30:06 AM Number of Addenda: 0

## 2018-05-30 NOTE — Transfer of Care (Signed)
Immediate Anesthesia Transfer of Care Note  Patient: Lindsey Salazar  Procedure(s) Performed: ESOPHAGOGASTRODUODENOSCOPY (EGD) WITH PROPOFOL (N/A ) BIOPSY  Patient Location: Endo  Anesthesia Type:MAC  Level of Consciousness: drowsy, patient cooperative and responds to stimulation  Airway & Oxygen Therapy: Patient Spontanous Breathing and Patient connected to nasal cannula oxygen  Post-op Assessment: Report given to RN and Post -op Vital signs reviewed and stable  Post vital signs: Reviewed and stable  Last Vitals:  Vitals Value Taken Time  BP 84/33 05/30/2018 11:22 AM  Temp    Pulse 64 05/30/2018 11:22 AM  Resp 13 05/30/2018 11:22 AM  SpO2 99 % 05/30/2018 11:22 AM  Vitals shown include unvalidated device data.  Last Pain:  Vitals:   05/30/18 1000  TempSrc: Oral  PainSc: 0-No pain         Complications: No apparent anesthesia complications

## 2018-05-30 NOTE — Interval H&P Note (Signed)
History and Physical Interval Note:  05/30/2018 10:50 AM  Lindsey Salazar  has presented today for surgery, with the diagnosis of GI bleed, anemia.  The various methods of treatment have been discussed with the patient and family. After consideration of risks, benefits and other options for treatment, the patient has consented to  Procedure(s): ESOPHAGOGASTRODUODENOSCOPY (EGD) WITH PROPOFOL (N/A) as a surgical intervention.  The patient's history has been reviewed, patient examined, no change in status, stable for surgery.  I have reviewed the patient's chart and labs.  Questions were answered to the patient's satisfaction.     Dominic Pea [place]

## 2018-05-30 NOTE — Anesthesia Preprocedure Evaluation (Addendum)
Anesthesia Evaluation  Patient identified by MRN, date of birth, ID band Patient awake    Reviewed: Allergy & Precautions, NPO status , Patient's Chart, lab work & pertinent test results  Airway Mallampati: II  TM Distance: >3 FB Neck ROM: Full    Dental  (+) Teeth Intact   Pulmonary neg shortness of breath, neg sleep apnea, COPD, neg recent URI,    breath sounds clear to auscultation       Cardiovascular hypertension, Pt. on medications + Peripheral Vascular Disease   Rhythm:Regular     Neuro/Psych PSYCHIATRIC DISORDERS Dementia negative neurological ROS     GI/Hepatic Neg liver ROS, ? GI bleed   Endo/Other  negative endocrine ROS  Renal/GU CRFRenal disease     Musculoskeletal negative musculoskeletal ROS (+)   Abdominal   Peds  Hematology  (+) anemia ,   Anesthesia Other Findings   Reproductive/Obstetrics                            Anesthesia Physical Anesthesia Plan  ASA: III  Anesthesia Plan: MAC   Post-op Pain Management:    Induction: Intravenous  PONV Risk Score and Plan: 2 and Treatment may vary due to age or medical condition and Propofol infusion  Airway Management Planned: Nasal Cannula  Additional Equipment: None  Intra-op Plan:   Post-operative Plan:   Informed Consent: I have reviewed the patients History and Physical, chart, labs and discussed the procedure including the risks, benefits and alternatives for the proposed anesthesia with the patient or authorized representative who has indicated his/her understanding and acceptance.     Dental advisory given  Plan Discussed with: CRNA and Surgeon  Anesthesia Plan Comments:         Anesthesia Quick Evaluation

## 2018-05-31 ENCOUNTER — Encounter (HOSPITAL_COMMUNITY): Payer: Self-pay | Admitting: Gastroenterology

## 2018-05-31 ENCOUNTER — Other Ambulatory Visit: Payer: Self-pay | Admitting: *Deleted

## 2018-05-31 ENCOUNTER — Telehealth: Payer: Self-pay | Admitting: *Deleted

## 2018-05-31 DIAGNOSIS — D62 Acute posthemorrhagic anemia: Secondary | ICD-10-CM

## 2018-05-31 DIAGNOSIS — K921 Melena: Secondary | ICD-10-CM

## 2018-05-31 DIAGNOSIS — N184 Chronic kidney disease, stage 4 (severe): Secondary | ICD-10-CM

## 2018-05-31 DIAGNOSIS — I10 Essential (primary) hypertension: Secondary | ICD-10-CM

## 2018-05-31 DIAGNOSIS — K2970 Gastritis, unspecified, without bleeding: Secondary | ICD-10-CM

## 2018-05-31 DIAGNOSIS — K259 Gastric ulcer, unspecified as acute or chronic, without hemorrhage or perforation: Secondary | ICD-10-CM

## 2018-05-31 DIAGNOSIS — K2990 Gastroduodenitis, unspecified, without bleeding: Secondary | ICD-10-CM

## 2018-05-31 LAB — BASIC METABOLIC PANEL
Anion gap: 10 (ref 5–15)
BUN: 39 mg/dL — ABNORMAL HIGH (ref 8–23)
CO2: 21 mmol/L — ABNORMAL LOW (ref 22–32)
Calcium: 8 mg/dL — ABNORMAL LOW (ref 8.9–10.3)
Chloride: 112 mmol/L — ABNORMAL HIGH (ref 98–111)
Creatinine, Ser: 1.81 mg/dL — ABNORMAL HIGH (ref 0.44–1.00)
GFR calc Af Amer: 29 mL/min — ABNORMAL LOW (ref >60)
GFR calc non Af Amer: 25 mL/min — ABNORMAL LOW (ref >60)
Glucose, Bld: 80 mg/dL (ref 70–99)
Potassium: 4.4 mmol/L (ref 3.5–5.1)
Sodium: 143 mmol/L (ref 135–145)

## 2018-05-31 LAB — TYPE AND SCREEN
ABO/RH(D): O POS
Antibody Screen: NEGATIVE
Unit division: 0
Unit division: 0

## 2018-05-31 LAB — CBC
HCT: 24.6 % — ABNORMAL LOW (ref 36.0–46.0)
Hemoglobin: 8.3 g/dL — ABNORMAL LOW (ref 12.0–15.0)
MCH: 32.5 pg (ref 26.0–34.0)
MCHC: 33.7 g/dL (ref 30.0–36.0)
MCV: 96.5 fL (ref 80.0–100.0)
Platelets: 117 10*3/uL — ABNORMAL LOW (ref 150–400)
RBC: 2.55 MIL/uL — ABNORMAL LOW (ref 3.87–5.11)
RDW: 16.2 % — ABNORMAL HIGH (ref 11.5–15.5)
WBC: 8 10*3/uL (ref 4.0–10.5)
nRBC: 0.2 % (ref 0.0–0.2)

## 2018-05-31 LAB — BPAM RBC
Blood Product Expiration Date: 202005142359
Blood Product Expiration Date: 202005222359
ISSUE DATE / TIME: 202005091434
ISSUE DATE / TIME: 202005100557
Unit Type and Rh: 5100
Unit Type and Rh: 9500

## 2018-05-31 MED ORDER — TRAZODONE HCL 50 MG PO TABS
50.0000 mg | ORAL_TABLET | Freq: Once | ORAL | Status: AC
  Administered 2018-05-31: 50 mg via ORAL
  Filled 2018-05-31: qty 1

## 2018-05-31 MED ORDER — PANTOPRAZOLE SODIUM 40 MG PO TBEC: 40.0000 mg | DELAYED_RELEASE_TABLET | Freq: Two times a day (BID) | ORAL | 1 refills | Status: DC

## 2018-05-31 MED FILL — PANTOPRAZOLE SOD DR 40 MG T: 40 | 30 days supply | Qty: 60 | Fill #0 | Status: TO

## 2018-05-31 NOTE — Discharge Summary (Addendum)
Physician Discharge Summary  Lindsey Salazar EXN:[id number] DOB: [birth year] DOA: 05/29/2018  PCP: Gayland Curry, DO  Admit date: 05/29/2018 Discharge date: 05/31/2018  Admitted From: Home  Disposition:  Home with [facility]   Recommendations for Outpatient Follow-up:  1. Follow up with Dr. Bryan Lemma in 2-3 weeks 2. Please obtain CBC at GI follow up 3. Please follow up on the following pending results: stomach biopsy 4. Dr. Mariea Clonts: Please assess need for aspirin for CV prevention or other indication, and restart only if needed 5. Dr. Mariea Clonts: Please reassess BP at next visit and restart amlodipine if needed      Home Health: Yes  Equipment/Devices: 3-in-1, walker  Discharge Condition: Good CODE STATUS: DO NOT RESUSCITATE Diet recommendation: Cardiac  Brief/Interim Summary: Lindsey Salazar is a 85 y.o. F with dementia, home dwelling, HTN, CKD IV baseline Cr 1.8, and thrombocytopenia who presents with syncopal episode after melena.  Last Friday, she was here with markedly elevated BP. She was having abdominal pain. The evaluation was fairly unremarkable. She was restless overnight. She tried to get up about 615 this AM. Family checked on her and found her in the floor in a pool of sweat. She had bowel and bladder incontinence - bowel contents were dark and tarry. She was very weak.        PRINCIPAL HOSPITAL DIAGNOSIS: Acute blood loss anemia from bleeding gastric ulcer    Discharge Diagnoses:   Melena Upper GI bleeding Admitted and transfused 2 units.  No further bleeding or hemodynamic instability clinically.    EGD 5/10 showed gastric ulcer, there was some visual concern for malignancy.  No active bleeding or visible vessel. -PPI BID for 8 weeks -Aspirin stopped, if this is for primary prevention of CV event, would favor not restarting  Acute blood loss anemia Hgb 14 at baseline, <6 g/dL here.  Transfused 2 units.  Discharge Hgb 8.3 g/dL.    Hypertension Amlodipine held at discharge.  To be restarted by PCP.  Dementia  CKD IV Creatinine stable relative to baseline  Thrombocytopenia Mild, stable relative to baseline   SARS-CoV-2 testing was obtained for screening purposes.  COVID ruled out.   NSVT Brief, self-limited, asymptomatic.  Has previous history of.        Discharge Instructions  Discharge Instructions    Diet - low sodium heart healthy   Complete by:  As directed    Discharge instructions   Complete by:  As directed    From Dr. Loleta Books: You were admitted for bleeding from your stomach.  This was from an ulcer in your stomach. The ulcer appears to have stopped bleeding.  Follow up with Dr. Bryan Lemma to determine the final biopsy results.  To help the ulcer heal: DO NOT TAKE ANY NSAIDS or other NSAID over the counter pain medicines Also, DO TAKE pantoprazole/Protonix 40 mg twice daily This works best when taken about 30 minutes before a meal     Follow up with Dr. Bryan Lemma as directed by him (if you don't have an appointment time yet, call his office at the number listed below in the "To Do" section)   For now, continue her Bystolic blood pressure medicine, but don't take her amlodipine yet. Check her blood pressure daily. If her blood pressure goes over 130 mmHg on a consistent basis, resume the amlodipine Otherwise call Dr. Mariea Clonts   Increase activity slowly   Complete by:  As directed      Allergies as of 05/31/2018  Reactions   Penicillins Nausea And Vomiting, Other (See Comments)   Did it involve swelling of the face/tongue/throat, SOB, or low BP? Unknown Did it involve sudden or severe rash/hives, skin peeling, or any reaction on the inside of your mouth or nose? Unknown Did you need to seek medical attention at a hospital or doctor's office? Unknown When did it last happen?unk If all above answers are "NO", may proceed with cephalosporin use.      Medication List     STOP taking these medications   amLODipine 5 MG tablet Commonly known as:  NORVASC   aspirin 81 MG tablet     TAKE these medications   atorvastatin 10 MG tablet Commonly known as:  LIPITOR TAKE 1 TABLET ONCE DAILY TO LOWER CHOLESTEROL. What changed:  See the new instructions.   donepezil 10 MG tablet Commonly known as:  ARICEPT TAKE ONE TABLET AT BEDTIME.   feeding supplement (BOOST BREEZE) Liqd Take 1 Can by mouth daily. What changed:  how much to take   LIPOTRIAD VISION SUPPORT PO Take 1 tablet by mouth daily.   memantine 28 MG Cp24 24 hr capsule Commonly known as:  NAMENDA XR TAKE 1 CAPSULE DAILY.   nebivolol 5 MG tablet Commonly known as:  Bystolic Take 1 tablet (5 mg total) by mouth daily.   pantoprazole 40 MG tablet Commonly known as:  PROTONIX Take 1 tablet (40 mg total) by mouth 2 (two) times daily before a meal.   Vitamin D3 50 MCG (2000 UT) capsule Take 1 capsule (2,000 Units total) by mouth daily.            Durable Medical Equipment  (From admission, onward)         Start     Ordered   05/31/18 1020  For home use only DME 3 n 1  Once     05/31/18 1020         Follow-up Information    [facility] Follow up.   Why:  Bedside commode will be delivered to bedside prior to discharge       [facility] Follow up.   Why:   Home health services arranged, PT. Start of care to begin with 48 hours [phone]       Cirigliano, Vito V, DO Follow up.   Specialty:  Gastroenterology Contact information: [address] [place] [address] [phone]          Allergies  Allergen Reactions  . Penicillins Nausea And Vomiting and Other (See Comments)    Did it involve swelling of the face/tongue/throat, SOB, or low BP? Unknown Did it involve sudden or severe rash/hives, skin peeling, or any reaction on the inside of your mouth or nose? Unknown Did you need to seek medical attention at a hospital or  doctor's office? Unknown When did it last happen?unk If all above answers are "NO", may proceed with cephalosporin use.     Consultations:  GI    Procedures/Studies: Ct Abdomen Pelvis Wo Contrast  Result Date: 05/21/2018 CLINICAL DATA:  Dysuria EXAM: CT ABDOMEN AND PELVIS WITHOUT CONTRAST TECHNIQUE: Multidetector CT imaging of the abdomen and pelvis was performed following the standard protocol without IV contrast. COMPARISON:  Renal ultrasound 11/11/2016 FINDINGS: Lower chest: Pectus deformity of the sternum.  No acute abnormality. Hepatobiliary: No focal hepatic abnormality. Gallbladder unremarkable. Pancreas: No focal abnormality or ductal dilatation. Spleen: No focal abnormality.  Normal size. Adrenals/Urinary Tract: Numerous low-density lesions within both kidneys  which cannot be characterized without intravenous contrast. No hydronephrosis. Fullness of the adrenal glands bilaterally compatible with hyperplasia. Urinary bladder unremarkable. Stomach/Bowel: Marked circumferential wall thickening within the gastric wall within the antrum and pyloric regions. This is presumably related to severe gastritis although infiltrating tumor cannot be excluded. Moderate stool burden in the colon. Small bowel decompressed, grossly unremarkable. Vascular/Lymphatic: Aortic atherosclerosis. No enlarged abdominal or pelvic lymph nodes. Reproductive: Uterus and adnexa unremarkable.  No mass. Other: Small amount of free fluid in the cul-de-sac.  No free air. Musculoskeletal: No acute bony abnormality. Degenerative changes in the lower lumbar spine. IMPRESSION: Marked circumferential wall thickening within the gastric antrum and pyloric regions, presumably related to gastritis although infiltrating tumor cannot be excluded. No evidence of perforation. Trace free fluid in the cul-de-sac. Moderate stool burden in the colon. Aortic atherosclerosis. Electronically Signed   By: Rolm Baptise M.D.   On: 05/21/2018  22:37   Ct Head Wo Contrast  Result Date: 05/29/2018 CLINICAL DATA:  Pt coming from home arrives via ems. Pt awoke this morning needing to have a BM and got very weak in her legs upon ambulating to the bathroom with walker. Pt BM was very dark. Pts husband and son helped her to the ground. HX of d.*comment was truncated*Head trauma, ataxia EXAM: CT HEAD WITHOUT CONTRAST CT CERVICAL SPINE WITHOUT CONTRAST TECHNIQUE: Multidetector CT imaging of the head and cervical spine was performed following the standard protocol without intravenous contrast. Multiplanar CT image reconstructions of the cervical spine were also generated. COMPARISON:  Head CT 05/21/2018 FINDINGS: CT HEAD FINDINGS Brain: No acute intracranial hemorrhage. No focal mass lesion. No CT evidence of acute infarction. No midline shift or mass effect. No hydrocephalus. Basilar cisterns are patent. There are periventricular and subcortical white matter hypodensities. Generalized cortical atrophy. Vascular: No hyperdense vessel or unexpected calcification. Skull: Normal. Negative for fracture or focal lesion. Sinuses/Orbits: Paranasal sinuses and mastoid air cells are clear. Orbits are clear. Other: None. CT CERVICAL SPINE FINDINGS Alignment: Normal alignment of the cervical vertebral bodies. Skull base and vertebrae: Normal craniocervical junction. No loss of vertebral body height or disc height. Normal facet articulation. No evidence of fracture. Soft tissues and spinal canal: No prevertebral soft tissue swelling. No perispinal or epidural hematoma. Disc levels: Mild endplate spurring. Disc space narrowing at C5-C6. Upper chest: Clear Other: None IMPRESSION: 1. No acute intracranial findings. 2. Atrophy and white matter microvascular disease. 3. No cervical spine fracture. 4. Moderate disc osteophytic disease. Electronically Signed   By: Suzy Bouchard M.D.   On: 05/29/2018 08:43   Ct Head Wo Contrast  Result Date: 05/21/2018 CLINICAL DATA:   Fatigue, malaise EXAM: CT HEAD WITHOUT CONTRAST TECHNIQUE: Contiguous axial images were obtained from the base of the skull through the vertex without intravenous contrast. COMPARISON:  02/07/2016 FINDINGS: Brain: There is atrophy and chronic small vessel disease changes. No acute intracranial abnormality. Specifically, no hemorrhage, hydrocephalus, mass lesion, acute infarction, or significant intracranial injury. Vascular: No hyperdense vessel or unexpected calcification. Skull: No acute calvarial abnormality. Sinuses/Orbits: Visualized paranasal sinuses and mastoids clear. Orbital soft tissues unremarkable. Other: None IMPRESSION: Atrophy, chronic microvascular disease. No acute intracranial abnormality. Electronically Signed   By: Rolm Baptise M.D.   On: 05/21/2018 22:33   Ct Cervical Spine Wo Contrast  Result Date: 05/29/2018 CLINICAL DATA:  Pt coming from home arrives via ems. Pt awoke this morning needing to have a BM and got very weak in her legs upon ambulating to the bathroom with  walker. Pt BM was very dark. Pts husband and son helped her to the ground. HX of d.*comment was truncated*Head trauma, ataxia EXAM: CT HEAD WITHOUT CONTRAST CT CERVICAL SPINE WITHOUT CONTRAST TECHNIQUE: Multidetector CT imaging of the head and cervical spine was performed following the standard protocol without intravenous contrast. Multiplanar CT image reconstructions of the cervical spine were also generated. COMPARISON:  Head CT 05/21/2018 FINDINGS: CT HEAD FINDINGS Brain: No acute intracranial hemorrhage. No focal mass lesion. No CT evidence of acute infarction. No midline shift or mass effect. No hydrocephalus. Basilar cisterns are patent. There are periventricular and subcortical white matter hypodensities. Generalized cortical atrophy. Vascular: No hyperdense vessel or unexpected calcification. Skull: Normal. Negative for fracture or focal lesion. Sinuses/Orbits: Paranasal sinuses and mastoid air cells are clear. Orbits  are clear. Other: None. CT CERVICAL SPINE FINDINGS Alignment: Normal alignment of the cervical vertebral bodies. Skull base and vertebrae: Normal craniocervical junction. No loss of vertebral body height or disc height. Normal facet articulation. No evidence of fracture. Soft tissues and spinal canal: No prevertebral soft tissue swelling. No perispinal or epidural hematoma. Disc levels: Mild endplate spurring. Disc space narrowing at C5-C6. Upper chest: Clear Other: None IMPRESSION: 1. No acute intracranial findings. 2. Atrophy and white matter microvascular disease. 3. No cervical spine fracture. 4. Moderate disc osteophytic disease. Electronically Signed   By: Suzy Bouchard M.D.   On: 05/29/2018 08:43   Dg Chest Port 1 View  Result Date: 05/29/2018 CLINICAL DATA:  Pt awoke this morning needing to have a BM and got very weak in her legs upon ambulating to the bathroom with walker. H/o dementia.weakness EXAM: PORTABLE CHEST 1 VIEW COMPARISON:  05/21/2018 FINDINGS: Normal mediastinum and cardiac silhouette. Lungs are hyperinflated. Normal pulmonary vasculature. No evidence of effusion, infiltrate, or pneumothorax. No acute bony abnormality. IMPRESSION: hyperinflated lungs.  No acute findings. Electronically Signed   By: Suzy Bouchard M.D.   On: 05/29/2018 08:10   Dg Chest Portable 1 View  Result Date: 05/21/2018 CLINICAL DATA:  Feeling unwell EXAM: PORTABLE CHEST 1 VIEW COMPARISON:  04/06/2012 FINDINGS: Pain The lungs are hyperinflated likely secondary to COPD. There is no focal parenchymal opacity. There is no pleural effusion or pneumothorax. The heart and mediastinal contours are unremarkable. The osseous structures are unremarkable. IMPRESSION: No active disease. Electronically Signed   By: Kathreen Devoid   On: 05/21/2018 21:55      Subjective: Feels well.  No melena, no vomiting, no hematochezia.  Some delirium, sundowning overnight.  No fever.  Discharge Exam: Vitals:   05/30/18 2208  05/31/18 0509  BP: 118/64 122/68  Pulse: 60 (!) 58  Resp: 18 18  Temp: 98.5 F (36.9 C) 98 F (36.7 C)  SpO2: 96% 98%   Vitals:   05/30/18 1135 05/30/18 1537 05/30/18 2208 05/31/18 0509  BP: 107/62 128/71 118/64 122/68  Pulse: 66 67 60 (!) 58  Resp: 16 14 18 18   Temp:  99.1 F (37.3 C) 98.5 F (36.9 C) 98 F (36.7 C)  TempSrc:  Oral Oral Oral  SpO2: 98% 100% 96% 98%  Weight:      Height:        General: Pt is alert, awake, not in acute distress, sitting up in bed Cardiovascular: RRR, nl O1-H0, systolic murmur noted.   No LE edema.   Respiratory: Normal respiratory rate and rhythm.  CTAB without rales or wheezes. Abdominal: Abdomen soft and non-tender.  No distension or HSM.   Neuro/Psych: Strength symmetric in upper and  lower extremities.  Judgment and insight appear moderately impaired.  Pleasant affect.   The results of significant diagnostics from this hospitalization (including imaging, microbiology, ancillary and laboratory) are listed below for reference.     Microbiology: Recent Results (from the past 240 hour(s))  Urine culture     Status: Abnormal   Collection Time: 05/21/18 11:09 PM  Result Value Ref Range Status   Specimen Description URINE, CLEAN CATCH  Final   Special Requests NONE  Final   Culture (A)  Final    <10,000 COLONIES/mL INSIGNIFICANT GROWTH Performed at [facility], [address] [place], [place] [address]    Report Status 05/23/2018 FINAL  Final  SARS Coronavirus 2 (CEPHEID - Performed in [place] hospital lab), Hosp Order     Status: None   Collection Time: 05/29/18  9:22 AM  Result Value Ref Range Status   SARS Coronavirus 2 NEGATIVE NEGATIVE Final    Comment: (NOTE) If result is NEGATIVE SARS-CoV-2 target nucleic acids are NOT DETECTED. The SARS-CoV-2 RNA is generally detectable in upper and lower  respiratory specimens during the acute phase of infection. The lowest  concentration of SARS-CoV-2 viral copies this assay  can detect is 250  copies / mL. A negative result does not preclude SARS-CoV-2 infection  and should not be used as the sole basis for treatment or other  patient management decisions.  A negative result may occur with  improper specimen collection / handling, submission of specimen other  than nasopharyngeal swab, presence of viral mutation(s) within the  areas targeted by this assay, and inadequate number of viral copies  (<250 copies / mL). A negative result must be combined with clinical  observations, patient history, and epidemiological information. If result is POSITIVE SARS-CoV-2 target nucleic acids are DETECTED. The SARS-CoV-2 RNA is generally detectable in upper and lower  respiratory specimens dur ing the acute phase of infection.  Positive  results are indicative of active infection with SARS-CoV-2.  Clinical  correlation with patient history and other diagnostic information is  necessary to determine patient infection status.  Positive results do  not rule out bacterial infection or co-infection with other viruses. If result is PRESUMPTIVE POSTIVE SARS-CoV-2 nucleic acids MAY BE PRESENT.   A presumptive positive result was obtained on the submitted specimen  and confirmed on repeat testing.  While 2019 novel coronavirus  (SARS-CoV-2) nucleic acids may be present in the submitted sample  additional confirmatory testing may be necessary for epidemiological  and / or clinical management purposes  to differentiate between  SARS-CoV-2 and other Sarbecovirus currently known to infect humans.  If clinically indicated additional testing with an alternate test  methodology [phone]) is advised. The SARS-CoV-2 RNA is generally  detectable in upper and lower respiratory sp ecimens during the acute  phase of infection. The expected result is Negative. Fact Sheet for Patients:  StrictlyIdeas.no Fact Sheet for Healthcare  Providers: BankingDealers.co.za This test is not yet approved or cleared by the [place] FDA and has been authorized for detection and/or diagnosis of SARS-CoV-2 by FDA under an Emergency Use Authorization (EUA).  This EUA will remain in effect (meaning this test can be used) for the duration of the COVID-19 declaration under Section 564(b)(1) of the Act, 21 U.S.C. section 360bbb-3(b)(1), unless the authorization is terminated or revoked sooner. Performed at [facility], [place] [address] [place],  [address]      Labs: BNP (last 3 results) No results for input(s): BNP  in the last 8760 hours. Basic Metabolic Panel: Recent Labs  Lab 05/26/18 1001 05/29/18 0741 05/30/18 0351 05/31/18 0451  NA 140 146* 145 143  K 4.6 4.8 4.9 4.4  CL 106 115* 116* 112*  CO2 27 20* 22 21*  GLUCOSE 122 148* 96 80  BUN 43* 85* 61* 39*  CREATININE 1.67* 1.82* 1.79* 1.81*  CALCIUM 8.7 8.5* 8.0* 8.0*   Liver Function Tests: Recent Labs  Lab 05/26/18 1001 05/29/18 0741  AST 15 14*  ALT 14 13  ALKPHOS  --  38  BILITOT 0.5 0.5  PROT 5.8* 4.9*  ALBUMIN  --  2.5*   Recent Labs  Lab 05/26/18 1001  LIPASE 140*   No results for input(s): AMMONIA in the last 168 hours. CBC: Recent Labs  Lab 05/26/18 1001 05/29/18 0741 05/29/18 2034 05/30/18 0351 05/30/18 1605 05/31/18 0451  WBC 6.0 11.6* 11.3* 9.6 7.9 8.0  NEUTROABS 4,194 9.7*  --   --   --   --   HGB 12.1 6.5* 7.0* 6.3* 8.6* 8.3*  HCT 36.0 21.3* 21.3* 19.4* 25.9* 24.6*  MCV 95.0 104.9* 100.5* 99.5 96.3 96.5  PLT 142 131* 111* 106* 115* 117*   Cardiac Enzymes: No results for input(s): CKTOTAL, CKMB, CKMBINDEX, TROPONINI in the last 168 hours. BNP: Invalid input(s): POCBNP CBG: No results for input(s): GLUCAP in the last 168 hours. D-Dimer No results for input(s): DDIMER in the last 72 hours. Hgb A1c No results for input(s): HGBA1C in the last 72 hours. Lipid Profile No results for  input(s): CHOL, HDL, LDLCALC, TRIG, CHOLHDL, LDLDIRECT in the last 72 hours. Thyroid function studies No results for input(s): TSH, T4TOTAL, T3FREE, THYROIDAB in the last 72 hours.  Invalid input(s): FREET3 Anemia work up No results for input(s): VITAMINB12, FOLATE, FERRITIN, TIBC, IRON, RETICCTPCT in the last 72 hours. Urinalysis    Component Value Date/Time   COLORURINE YELLOW 05/21/2018 2307   APPEARANCEUR CLEAR 05/21/2018 2307   LABSPEC 1.013 05/21/2018 2307   PHURINE 7.0 05/21/2018 2307   GLUCOSEU NEGATIVE 05/21/2018 2307   HGBUR NEGATIVE 05/21/2018 2307   BILIRUBINUR NEGATIVE 05/21/2018 2307   BILIRUBINUR neg 07/02/2017 1513   KETONESUR 5 (A) 05/21/2018 2307   PROTEINUR 100 (A) 05/21/2018 2307   UROBILINOGEN 0.2 07/02/2017 1513   NITRITE NEGATIVE 05/21/2018 2307   LEUKOCYTESUR NEGATIVE 05/21/2018 2307   Sepsis Labs Invalid input(s): PROCALCITONIN,  WBC,  LACTICIDVEN Microbiology Recent Results (from the past 240 hour(s))  Urine culture     Status: Abnormal   Collection Time: 05/21/18 11:09 PM  Result Value Ref Range Status   Specimen Description URINE, CLEAN CATCH  Final   Special Requests NONE  Final   Culture (A)  Final    <10,000 COLONIES/mL INSIGNIFICANT GROWTH Performed at [facility], [address], [place], [place] [address]    Report Status 05/23/2018 FINAL  Final  SARS Coronavirus 2 (CEPHEID - Performed in [place] hospital lab), Hosp Order     Status: None   Collection Time: 05/29/18  9:22 AM  Result Value Ref Range Status   SARS Coronavirus 2 NEGATIVE NEGATIVE Final    Comment: (NOTE) If result is NEGATIVE SARS-CoV-2 target nucleic acids are NOT DETECTED. The SARS-CoV-2 RNA is generally detectable in upper and lower  respiratory specimens during the acute phase of infection. The lowest  concentration of SARS-CoV-2 viral copies this assay can detect is 250  copies / mL. A negative result does not preclude SARS-CoV-2 infection  and  should not  be used as the sole basis for treatment or other  patient management decisions.  A negative result may occur with  improper specimen collection / handling, submission of specimen other  than nasopharyngeal swab, presence of viral mutation(s) within the  areas targeted by this assay, and inadequate number of viral copies  (<250 copies / mL). A negative result must be combined with clinical  observations, patient history, and epidemiological information. If result is POSITIVE SARS-CoV-2 target nucleic acids are DETECTED. The SARS-CoV-2 RNA is generally detectable in upper and lower  respiratory specimens dur ing the acute phase of infection.  Positive  results are indicative of active infection with SARS-CoV-2.  Clinical  correlation with patient history and other diagnostic information is  necessary to determine patient infection status.  Positive results do  not rule out bacterial infection or co-infection with other viruses. If result is PRESUMPTIVE POSTIVE SARS-CoV-2 nucleic acids MAY BE PRESENT.   A presumptive positive result was obtained on the submitted specimen  and confirmed on repeat testing.  While 2019 novel coronavirus  (SARS-CoV-2) nucleic acids may be present in the submitted sample  additional confirmatory testing may be necessary for epidemiological  and / or clinical management purposes  to differentiate between  SARS-CoV-2 and other Sarbecovirus currently known to infect humans.  If clinically indicated additional testing with an alternate test  methodology [phone]) is advised. The SARS-CoV-2 RNA is generally  detectable in upper and lower respiratory sp ecimens during the acute  phase of infection. The expected result is Negative. Fact Sheet for Patients:  StrictlyIdeas.no Fact Sheet for Healthcare Providers: BankingDealers.co.za This test is not yet approved or cleared by the [place] FDA and has been authorized  for detection and/or diagnosis of SARS-CoV-2 by FDA under an Emergency Use Authorization (EUA).  This EUA will remain in effect (meaning this test can be used) for the duration of the COVID-19 declaration under Section 564(b)(1) of the Act, 21 U.S.C. section 360bbb-3(b)(1), unless the authorization is terminated or revoked sooner. Performed at [facility], [place] [address] [place], [place] [address]      Time coordinating discharge: 40 minutes      SIGNED:   Edwin Dada, MD  [facility] 05/31/2018, 2:04 PM

## 2018-05-31 NOTE — Telephone Encounter (Signed)
Transition Care Management Follow-up Telephone Call  Date of discharge and from where: 05/31/2018 [place]  How have you been since you were released from the hospital? Doing Good, just got home  Any questions or concerns? No   Items Reviewed:  Did the pt receive and understand the discharge instructions provided? Yes   Medications obtained and verified? Yes   Any new allergies since your discharge? No   Dietary orders reviewed? Yes  Do you have support at home? Yes  [name], daughter  Other (ie: DME, Home Health, etc) Home Health  Functional Questionnaire: (I = Independent and D = Dependent) ADL's: I  Bathing/Dressing- I   Meal Prep- I  Eating- I  Maintaining continence- I  Transferring/Ambulation- I with assistance  Managing Meds- I   Follow up appointments reviewed:    PCP Hospital f/u appt confirmed? Yes  Scheduled to see Dr. Mariea Clonts on 06/03/2018.  [facility] f/u appt confirmed? Yes  Just had TeleVisit with GI  Are transportation arrangements needed? No   If their condition worsens, is the pt aware to call  their PCP or go to the ED? Yes  Was the patient provided with contact information for the PCP's office or ED? Yes  Was the pt encouraged to call back with questions or concerns? Yes

## 2018-05-31 NOTE — Progress Notes (Signed)
Pt seen by MD, orders written for discharge. RN went over discharge instructions with pt and answered all questions. RN removed IV's. Escorted for discharge via wheelchair with all belongings. Pt will follow up outpatient with MD.  

## 2018-05-31 NOTE — Evaluation (Signed)
Physical Therapy Evaluation Patient Details Name: Lindsey Salazar MRN: [id number] DOB: 02-Mar-1932 Today's Date: 05/31/2018   History of Present Illness  85 y.o. female admitted on 05/29/18 for syncopal episode.  Found to have UGIB with symptomatic anemia requiring 2 units PRBC s/p upper endoscopy 05/30/18 which found a large ulceration that was bx (suspicious for CA growth).  Pt with significant PMH of HTN, fall, thrombocytopenia, mitral valave disorder, CKD IV, R knee arthroscopy, and dementia.  Clinical Impression  Pt was able to walk a short distance down the hallway with very little assist and use of RW.  Son present and observing reporting significant h/o falls with injury, and shuffling gait pattern at baseline.  He reports she uses RW on/off PRN.  I encouraged using the RW full time now.  He was open to home therapy and requested a 3-in-1 for her to sit down to shower at home.  She has a walk in shower.  PT will continue to follow acutely for safe mobility progression.   PT to follow acutely for deficits listed below.      Follow Up Recommendations Home health PT;Supervision for mobility/OOB    Equipment Recommendations  3in1 (PT)    Recommendations for Other Services   NA     Precautions / Restrictions Precautions Precautions: Fall Precaution Comments: h/o falls      Mobility  Bed Mobility Overal bed mobility: Needs Assistance Bed Mobility: Supine to Sit;Sit to Supine     Supine to sit: Min guard;HOB elevated Sit to supine: Min guard;HOB elevated   General bed mobility comments: Min guard assist as pt reaches for a hand to pull up to sitting EOB.    Transfers Overall transfer level: Needs assistance Equipment used: Rolling walker (2 wheeled) Transfers: Sit to/from Stand Sit to Stand: Min guard         General transfer comment: Min guard steadying assit  Ambulation/Gait Ambulation/Gait assistance: Min guard Gait Distance (Feet): 45 Feet Assistive device:  Rolling walker (2 wheeled) Gait Pattern/deviations: Step-through pattern;Shuffle;Trunk flexed     General Gait Details: Pt walks with slow, shuffling gait.  Per son, who is present, this is her normal.  He was open to home therapy again for a "tune up" as she has had it in the past and thought it was beneficial.  Min guard assist with RW to steady pt, and I advised son for her to use her RW at home (as she used it PRN PTA) full time for now.           Balance Overall balance assessment: Needs assistance Sitting-balance support: Feet supported;No upper extremity supported Sitting balance-Leahy Scale: Good     Standing balance support: Bilateral upper extremity supported Standing balance-Leahy Scale: Poor Standing balance comment: needs external support for stability right now.                              Pertinent Vitals/Pain Pain Assessment: No/denies pain    Home Living Family/patient expects to be discharged to:: Private residence Living Arrangements: Spouse/significant other(94 y.o. husband and son (lives in the basement, but works)) Available Help at Discharge: Family;Available 24 hours/day Type of Home: House Home Access: Stairs to enter Entrance Stairs-Rails: Right;Left Entrance Stairs-Number of Steps: 2 Home Layout: One level;Laundry or work area in basement(does not go into the basement) Home Equipment: Environmental consultant - 4 wheels(has access to a WC if needed, just not at the house currentl)  Prior Function Level of Independence: Independent with assistive device(s);Needs assistance   Gait / Transfers Assistance Needed: walks at times with a rollator, husband helps, usually bathes and dresses herself.   ADL's / Homemaking Assistance Needed: needs assist, family helps as well as husband (despite his age he ambulates without an AD, still drives, cooks, etc).         Hand Dominance   Dominant Hand: Right    Extremity/Trunk Assessment   Upper Extremity  Assessment Upper Extremity Assessment: Generalized weakness    Lower Extremity Assessment Lower Extremity Assessment: Generalized weakness    Cervical / Trunk Assessment Cervical / Trunk Assessment: Kyphotic  Communication   Communication: No difficulties  Cognition Arousal/Alertness: Awake/alert Behavior During Therapy: WFL for tasks assessed/performed Overall Cognitive Status: History of cognitive impairments - at baseline                                 General Comments: repeats herself throughout the session.        General Comments General comments (skin integrity, edema, etc.): I spoke at length with her son, re: home situation, her husband's ability to help, home set up, etc.          Assessment/Plan    PT Assessment Patient needs continued PT services  PT Problem List Decreased strength;Decreased activity tolerance;Decreased balance;Decreased mobility;Decreased cognition;Decreased knowledge of use of DME       PT Treatment Interventions DME instruction;Gait training;Stair training;Functional mobility training;Therapeutic activities;Balance training;Therapeutic exercise;Patient/family education    PT Goals (Current goals can be found in the Care Plan section)  Acute Rehab PT Goals Patient Stated Goal: to go home, back to her husband PT Goal Formulation: With patient/family Time For Goal Achievement: 06/14/18 Potential to Achieve Goals: Good    Frequency Min 3X/week           AM-PAC PT "6 Clicks" Mobility  Outcome Measure Help needed turning from your back to your side while in a flat bed without using bedrails?: A Little Help needed moving from lying on your back to sitting on the side of a flat bed without using bedrails?: A Little Help needed moving to and from a bed to a chair (including a wheelchair)?: A Little Help needed standing up from a chair using your arms (e.g., wheelchair or bedside chair)?: A Little Help needed to walk in  hospital room?: A Little Help needed climbing 3-5 steps with a railing? : A Little 6 Click Score: 18    End of Session Equipment Utilized During Treatment: Gait belt Activity Tolerance: Patient tolerated treatment well Patient left: in bed;with call bell/phone within reach;with bed alarm set;with family/visitor present   PT Visit Diagnosis: Muscle weakness (generalized) (M62.81);Difficulty in walking, not elsewhere classified (R26.2);History of falling (Z91.81)    Time: 2229-7989 PT Time Calculation (min) (ACUTE ONLY): 35 min   Charges:           Wells Guiles B. Mallisa Alameda, PT, DPT  Acute Rehabilitation [phone] pager #[phone] office   PT Evaluation $PT Eval Moderate Complexity: 1 Mod PT Treatments $Gait Training: 8-22 mins       05/31/2018, 12:29 PM

## 2018-05-31 NOTE — TOC Transition Note (Signed)
Transition of Care [facility]) - CM/SW Discharge Note   Patient Details  Name: Zacari Radick MRN: [id number] Date of Birth: [birth year]  [facility]) CM/SW Contact:  Sharin Mons, RN Phone Number: 05/31/2018, 12:04 PM   Clinical Narrative:    Transition to home today with home health services. BSC will be delivered to bedside prior to d/c.  [name](son) to provide transportation to home.   Final next level of care: [place] Barriers to Discharge: Barriers Resolved   Patient Goals and CMS Choice Patient states their goals for this hospitalization and ongoing recovery are:: We would like for her to get strong CMS Medicare.gov Compare Post Acute Care list provided to:: Patient Represenative (must comment)([name] (son), mom with dementia) Choice offered to / list presented to : Adult Children([name])  Discharge Placement                       Discharge Plan and Services In-house Referral: NA Discharge Planning Services: CM Consult Post Acute Care Choice: Home Health          DME Arranged: 3-N-1 DME Agency: [employer] Date DME Agency Contacted: 05/31/18 Time DME Agency Contacted: 1024 Representative spoke with at DME Agency: [place]: PT [place]: [place] ([place]) Date [place]: 05/31/18 Time [place]: 1203 Representative spoke with at [place]: [place] ([place]) Interventions     Readmission Risk Interventions No flowsheet data found.

## 2018-05-31 NOTE — TOC Initial Note (Signed)
Transition of Care [facility]) - Initial/Assessment Note    Patient Details  Name: Lindsey Salazar MRN: [id number] Date of Birth: 1932/03/16  [facility]) CM/SW Contact:    Sharin Mons, RN Phone Number: 05/31/2018, 10:25 AM  Clinical Narrative:  Presents with syncopal episode. Resides with spouse.             NCM spoke with [name] ( son ) about d/c planning. PT evaluation pending .Marland KitchenMarland KitchenNCM to f/u with pt's son post evaluation. PTA son states mom required min. assist with ADL's.  Pt with transportation to home and no issues affording Rx meds.  Expected Discharge Plan: [place] Barriers to Discharge: Barriers Resolved   Patient Goals and CMS Choice Patient states their goals for this hospitalization and ongoing recovery are:: We would like for her to get strong CMS Medicare.gov Compare Post Acute Care list provided to:: Patient Represenative (must comment)([name] (son), mom with dementia) Choice offered to / list presented to : Adult Children([name])  Expected Discharge Plan and Services Expected Discharge Plan: [place] In-house Referral: NA Discharge Planning Services: CM Consult   Living arrangements for the past 2 months: Single Family Home                 DME Arranged: 3-N-1 DME Agency: [employer] Date DME Agency Contacted: 05/31/18 Time DME Agency Contacted: 1024 Representative spoke with at DME Agency: [place], [facility] will be delivered to bedside  Prior to d/c.            Prior Living Arrangements/Services Living arrangements for the past 2 months: Single Family Home Lives with:: Spouse Patient language and need for interpreter reviewed:: Yes Do you feel safe going back to the place where you live?: Yes      Need for Family Participation in Patient Care: Yes (Comment) Care giver support system in place?: Yes (comment)   Criminal Activity/Legal Involvement Pertinent to Current Situation/Hospitalization: No - Comment  as needed  Activities of Daily Living      Permission Sought/Granted Permission sought to share information with : Case Manager, Family Supports Permission granted to share information with : Yes, Verbal Permission Granted              Emotional Assessment Appearance:: Appears stated age Attitude/Demeanor/Rapport: Gracious Affect (typically observed): Accepting Orientation: : Oriented to Self Alcohol / Substance Use: Not Applicable Psych Involvement: No (comment)  Admission diagnosis:  Anemia due to blood loss, acute [D62] Gastrointestinal hemorrhage with melena [K92.1] Patient Active Problem List   Diagnosis Date Noted  . Gastric ulcer without hemorrhage or perforation   . Upper GI bleeding 05/29/2018  . Acute blood loss anemia 05/29/2018  . Gastrointestinal hemorrhage with melena   . Gastritis and gastroduodenitis   . Osteopenia 12/14/2017  . Adult BMI <19 kg/sq m 12/14/2017  . Underweight 12/14/2017  . Late onset Alzheimer's disease without behavioral disturbance ([place]) 10/09/2016  . Chronic kidney disease (CKD), stage IV (severe) ([place]) 10/09/2016  . Fracture, sacrum/coccyx ([place]) 01/30/2016  . Syncope 01/30/2016  . Light-headed feeling 09/25/2015  . Senile purpura ([place]) 06/06/2015  . Palpitations 05/30/2014  . Hyperglycemia 11/23/2012  . Thrombocytopenia, unspecified ([place]) 05/18/2012  . Mitral valve disorder 05/18/2012  . Senile cataract, unspecified 05/18/2012  . Abnormal EKG 04/15/2011  . HTN (hypertension) 04/15/2011  . Hyperlipidemia    PCP:  Gayland Curry, DO Pharmacy:   [facility]  [place] [address] Phone: [phone] Fax: [phone]  [facility] [place], [place] [address] Phone: [phone] Fax: [phone]  [facility] [place], [place] [address] [place] [address] Phone: [phone] Fax: [phone]     Social Determinants of Health (SDOH) Interventions    Readmission Risk Interventions No flowsheet data found.

## 2018-05-31 NOTE — Progress Notes (Signed)
Daily Rounding Note  05/31/2018, 11:34 AM  LOS: 1 day   SUBJECTIVE:   Chief complaint: Large, ulcerating gastric mass.  GI bleed, blood loss anemia     Small BM, not bloody, this AM.  No abd pain and no nausea.  No SOB or chest pain.  Ready for discharge, Dr Loleta Books ready to dc home if ok with GI.    OBJECTIVE:         Vital signs in last 24 hours:    Temp:  [98 F (36.7 C)-99.1 F (37.3 C)] 98 F (36.7 C) (05/11 0509) Pulse Rate:  [58-67] 58 (05/11 0509) Resp:  [14-18] 18 (05/11 0509) BP: (107-128)/(62-71) 122/68 (05/11 0509) SpO2:  [96 %-100 %] 98 % (05/11 0509) Last BM Date: 05/29/18 Filed Weights   05/29/18 0737 05/29/18 1238 05/30/18 6063  Weight: 49.9 kg 47.6 kg 50.7 kg   General: looks well.  No distress.     Heart: RRR Chest: clear bil.   Abdomen: soft, NT, ND.  Active BS  Extremities: no CCE.   Neuro/Psych: Pleasant, cooperative.  Does not have a lot to say.  Follows commands.  Intake/Output from previous day: 05/10 0701 - 05/11 0700 In: 535 [P.O.:220; Blood:315] Out: -   Intake/Output this shift: No intake/output data recorded.  Lab Results: Recent Labs    05/30/18 0351 05/30/18 1605 05/31/18 0451  WBC 9.6 7.9 8.0  HGB 6.3* 8.6* 8.3*  HCT 19.4* 25.9* 24.6*  PLT 106* 115* 117*   BMET Recent Labs    05/29/18 0741 05/30/18 0351 05/31/18 0451  NA 146* 145 143  K 4.8 4.9 4.4  CL 115* 116* 112*  CO2 20* 22 21*  GLUCOSE 148* 96 80  BUN 85* 61* 39*  CREATININE 1.82* 1.79* 1.81*  CALCIUM 8.5* 8.0* 8.0*   LFT Recent Labs    05/29/18 0741  PROT 4.9*  ALBUMIN 2.5*  AST 14*  ALT 13  ALKPHOS 38  BILITOT 0.5   PT/INR Recent Labs    05/29/18 0741  LABPROT 15.3*  INR 1.2    Studies/Results: No results found.   Scheduled Meds: . atorvastatin  10 mg Oral q1800  . donepezil  10 mg Oral QHS  . memantine  28 mg Oral Daily  . nebivolol  5 mg Oral Daily  . pantoprazole  40 mg  Oral BID   Continuous Infusions: PRN Meds:.acetaminophen **OR** acetaminophen, ondansetron **OR** ondansetron (ZOFRAN) IV   ASSESMENT:   *    Upper GI bleed.   CT 05/21/2018 showed gastric antral and pyloric region wall thickening, unable to r/o infiltrating tumor. 05/30/2018 EGD revealed extensive, large mass from the gastric antrum into the prepyloris and pylorus with 2 large, cratered ulcers without stigmata of bleeding.  Duodenal erythema.  Findings worrisome for potential obstruction.;   Area extensively biopsied, pathology pending. Continues on Protonix 40 mg po BID.    *    Blood loss anemia.  PRBC x2. Hgb 6.3 >> 8.3.    *     Thrombocytopenia.  *    AKI.   PLAN   *   Await pathology reports, if these confirm any malignancy, pursue staging chest CT. General surgery consult pending pathology results. ?   Can the patient discharge home pending results of pathology with any necessary referrals to surgery or CT chest arranged as an outpatient.  Will discuss with Dr. Rush Landmark.   Diet advanced to soft.    Protonix  40 mg BID as outpt.   Will need fup CBC as outpt (PMD is Hollace Kinnier)    Azucena Freed  05/31/2018, 11:34 AM Phone [phone]

## 2018-05-31 NOTE — Telephone Encounter (Signed)
Spoke with patients daughter and scheduled appts at the request of Dr. Rush Landmark for Dr. Bryan Lemma:  3 week follow up telephone visit post EGD: 06/21/18 at 8:30 am Virtual/telephone pre-visit with daughter: 06/25/18 at 1:00 pm 6 week repeat prop EGD in [place]: 07/06/18 at 8:30 am  Daughter is requesting to speak with health care staff as her mother is hard of hearing and unable to remember or understand content of conversation. Nothing further at the time of this phone call.

## 2018-06-01 ENCOUNTER — Encounter: Payer: Self-pay | Admitting: Gastroenterology

## 2018-06-01 ENCOUNTER — Telehealth: Payer: Self-pay | Admitting: *Deleted

## 2018-06-01 DIAGNOSIS — K922 Gastrointestinal hemorrhage, unspecified: Secondary | ICD-10-CM

## 2018-06-01 DIAGNOSIS — D62 Acute posthemorrhagic anemia: Secondary | ICD-10-CM

## 2018-06-01 DIAGNOSIS — I2510 Atherosclerotic heart disease of native coronary artery without angina pectoris: Secondary | ICD-10-CM

## 2018-06-01 DIAGNOSIS — F0280 Dementia in other diseases classified elsewhere without behavioral disturbance: Secondary | ICD-10-CM

## 2018-06-01 DIAGNOSIS — G301 Alzheimer's disease with late onset: Secondary | ICD-10-CM

## 2018-06-01 NOTE — Telephone Encounter (Signed)
Spoke with patient's daughter. Patient tested negative in the ED on 05/29/18.

## 2018-06-03 ENCOUNTER — Ambulatory Visit (INDEPENDENT_AMBULATORY_CARE_PROVIDER_SITE_OTHER): Payer: Medicare Other | Admitting: Internal Medicine

## 2018-06-03 ENCOUNTER — Other Ambulatory Visit: Payer: Self-pay

## 2018-06-03 ENCOUNTER — Encounter: Payer: Self-pay | Admitting: Internal Medicine

## 2018-06-03 DIAGNOSIS — G301 Alzheimer's disease with late onset: Secondary | ICD-10-CM

## 2018-06-03 DIAGNOSIS — R58 Hemorrhage, not elsewhere classified: Secondary | ICD-10-CM

## 2018-06-03 DIAGNOSIS — K859 Acute pancreatitis without necrosis or infection, unspecified: Secondary | ICD-10-CM

## 2018-06-03 DIAGNOSIS — D62 Acute posthemorrhagic anemia: Secondary | ICD-10-CM | POA: Diagnosis not present

## 2018-06-03 DIAGNOSIS — K259 Gastric ulcer, unspecified as acute or chronic, without hemorrhage or perforation: Secondary | ICD-10-CM | POA: Diagnosis not present

## 2018-06-03 DIAGNOSIS — F0280 Dementia in other diseases classified elsewhere without behavioral disturbance: Secondary | ICD-10-CM

## 2018-06-03 DIAGNOSIS — I9589 Other hypotension: Secondary | ICD-10-CM

## 2018-06-03 DIAGNOSIS — N184 Chronic kidney disease, stage 4 (severe): Secondary | ICD-10-CM

## 2018-06-03 NOTE — Progress Notes (Signed)
Patient ID: Lindsey Salazar, female   DOB: January 27, 1932, 85 y.o.   MRN: [id number]  This service is provided via telemedicine  No vital signs collected/recorded due to the encounter was a telemedicine visit.   Location of patient (ex: home, work):  home  Patient consents to a telephone visit:  yes  Location of the provider (ex: office, home):  office  Name of any referring provider:  Lanaiya Lantry Salazar  Names of all persons participating in the telemedicine service and their role in the encounter:  PATIENT, Lindsey Salazar, Lindsey Salazar, [place], [place] Salazar  Time spent on call:  4:06    Provider: Jadyn Brasher L. Mariea Salazar, D.O., C.M.D.  Code Status: DNR Goals of Care:  Advanced Directives 06/03/2018  Does Patient Have a Medical Advance Directive? Yes  Type of Paramedic of [place];Living will  Does patient want to make changes to medical advance directive? No - Patient declined  Copy of [place] in Chart? Yes - validated most recent copy scanned in chart (See row information)   Chief Complaint  Patient presents with  . Transitions Of Care    discharged 05/31/2018    HPI: Patient is a 85 y.o. female spoken with today by phone for hospital follow-up s/p admission from 5/9-5/11/20 with upper GI bleeding, weakness, fall at home.  Lindsey Salazar got weak in her legs Friday evening, 5/9.  It's not clear if she had any diarrhea that night.  She did get up a few times overnight--she was weak.  At 6am, she had to go to the bathroom.  She was very weak and fell.  Lindsey Salazar went to get their son who lives downstairs.  Lindsey Salazar took her to the bathroom, but she passed out and lost control of her bowels at that time.  He put her in the bed and called 911.  She did wake up right away.  She was able to communicate with EMS when they arrived.  Hgb dropped to 6.3 from 12 in just a few days.  She received a unit of blood Saturday afternoon and another Sunday morning.   Protonix was added.  EGD was Sunday and bleeding had stopped by then. She had a large antral ulcer but biopsies were negative for malignancy.  She also had inflammation in her duodenum.  hgb up to 8.3 on 5/11.    They have a f/u phone call 6/1 with Dr. Bryan Salazar.  There is a nurse visit before an EGD on 6/16.  She has been on aspirin and there's been no clear indication for it as she has not had cardiac disease.  She is off it now and can stay off.    She admits to being weak and "not quite up to par yet".  She is using a walker with wheels and seat (rollator).  Has not been out of the house, but is walking in the house.  Appetite doing well.  She eats what's put in front of her.  They started lighter and have gradually advanced.  Ensure was given.    Blood pressure:  It's a little low this morning.  84/45 with the wrist cuff.  93/56 with the arm cuff.  Last night 97/60.  Yesterday 103/58.  We had struggled with very high bps for months at times.  Now quite low with only bystolic 5mg .  They are doing great keeping the bp twice a day (Lindsey Salazar).    Lindsey Salazar is on furlough and she is visiting  frequently.  Lindsey Salazar is preparing meals and helping her a lot.  She is very proud of them.    There is a skin tear on her arm when she fell.  Lindsey Salazar is changing the dressing today.    Of note, she was exposed to a staff member who later tested positive for covid-19 when she was in the hospital on 5/1 (first ED visit).  She had a negative test 5/9 and has had no related symptoms.  She's now on day 14.  Past Medical History:  Diagnosis Date  . Acute bronchitis   . Benign neoplasm of uterus, part unspecified   . Chronic ischemic heart disease, unspecified   . Chronic kidney disease, stage II (mild)   . Cough   . Encounter for long-term (current) use of other medications   . Fall 01/30/2016  . Ganglion, unspecified   . Hearing loss   . Hyperlipidemia   . Hypertension   . Impacted cerumen   . Light-headed feeling  09/25/2015  . Macular degeneration (senile) of retina, unspecified   . Mitral valve disorders(424.0)   . Nonspecific abnormal electrocardiogram (ECG) (EKG)   . Osteoporosis, unspecified   . Other abnormal blood chemistry   . Other emphysema ([place])   . Other malaise and fatigue   . Palpitations   . Senile cataract, unspecified   . Senile purpura ([place]) 06/06/2015  . Thrombocytopenia, unspecified ([place])   . Unspecified hearing loss     Past Surgical History:  Procedure Laterality Date  . BIOPSY  05/30/2018   Procedure: BIOPSY;  Surgeon: Lindsey Bullion, Salazar;  Location: [facility];  Service: Gastroenterology;;  . ESOPHAGOGASTRODUODENOSCOPY (EGD) WITH PROPOFOL N/A 05/30/2018   Procedure: ESOPHAGOGASTRODUODENOSCOPY (EGD) WITH PROPOFOL;  Surgeon: Lindsey Bullion, Salazar;  Location: [facility];  Service: Gastroenterology;  Laterality: N/A;  . KNEE ARTHROSCOPY     right    Allergies  Allergen Reactions  . Penicillins Nausea And Vomiting and Other (See Comments)    Did it involve swelling of the face/tongue/throat, SOB, or low BP? Unknown Did it involve sudden or severe rash/hives, skin peeling, or any reaction on the inside of your mouth or nose? Unknown Did you need to seek medical attention at a hospital or doctor's office? Unknown When did it last happen?unk If all above answers are "NO", may proceed with cephalosporin use.     Outpatient Encounter Medications as of 06/03/2018  Medication Sig  . atorvastatin (LIPITOR) 10 MG tablet TAKE 1 TABLET ONCE DAILY TO LOWER CHOLESTEROL.  . Cholecalciferol (VITAMIN D3) 50 MCG (2000 UT) capsule Take 1 capsule (2,000 Units total) by mouth daily.  Marland Kitchen donepezil (ARICEPT) 10 MG tablet TAKE ONE TABLET AT BEDTIME.  Marland Kitchen Ensure (ENSURE) Take 1 Can by mouth daily.  . memantine (NAMENDA XR) 28 MG CP24 24 hr capsule TAKE 1 CAPSULE DAILY.  . nebivolol (BYSTOLIC) 5 MG tablet Take 1 tablet (5 mg total) by mouth daily.  . pantoprazole (PROTONIX) 40 MG  tablet Take 1 tablet (40 mg total) by mouth 2 (two) times daily before a meal.  . Specialty Vitamins Products (LIPOTRIAD VISION SUPPORT PO) Take 1 tablet by mouth daily.  . [DISCONTINUED] Nutritional Supplements (FEEDING SUPPLEMENT, BOOST BREEZE,) LIQD Take 1 Can by mouth daily.   No facility-administered encounter medications on file as of 06/03/2018.     Review of Systems:  Review of Systems  Constitutional: Positive for malaise/fatigue. Negative for chills, fever and weight loss.  HENT: Positive for hearing loss.  Eyes: Negative for blurred vision.       Glasses  Respiratory: Negative for cough and shortness of breath.   Cardiovascular: Positive for palpitations. Negative for chest pain and leg swelling.  Gastrointestinal: Negative for abdominal pain, blood in stool, constipation, diarrhea, heartburn, melena, nausea and vomiting.  Genitourinary: Negative for dysuria.  Musculoskeletal: Positive for falls. Negative for back pain and joint pain.  Skin: Negative for itching and rash.  Neurological: Positive for weakness. Negative for dizziness and loss of consciousness.  Endo/Heme/Allergies: Bruises/bleeds easily.  Psychiatric/Behavioral: Positive for memory loss. Negative for depression. The patient is not nervous/anxious and does not have insomnia.     Health Maintenance  Topic Date Due  . MAMMOGRAM  11/22/2017  . INFLUENZA VACCINE  08/21/2018  . TETANUS/TDAP  06/22/2024  . DEXA SCAN  Completed  . PNA vac Low Risk Adult  Completed    Physical Exam: There were no vitals filed for this visit. There is no height or weight on file to calculate BMI. Physical Exam Could not be performed due to non face-to-face visit  Labs reviewed: Basic Metabolic Panel: Recent Labs    05/29/18 0741 05/30/18 0351 05/31/18 0451  NA 146* 145 143  K 4.8 4.9 4.4  CL 115* 116* 112*  CO2 20* 22 21*  GLUCOSE 148* 96 80  BUN 85* 61* 39*  CREATININE 1.82* 1.79* 1.81*  CALCIUM 8.5* 8.0* 8.0*    Liver Function Tests: Recent Labs    05/21/18 1851 05/26/18 1001 05/29/18 0741  AST 20 15 14*  ALT 15 14 13   ALKPHOS 76  --  38  BILITOT 1.1 0.5 0.5  PROT 7.4 5.8* 4.9*  ALBUMIN 4.0  --  2.5*   Recent Labs    05/21/18 1851 05/26/18 1001  LIPASE 63* 140*   No results for input(s): AMMONIA in the last 8760 hours. CBC: Recent Labs    03/22/18 0951  05/26/18 1001 05/29/18 0741  05/30/18 0351 05/30/18 1605 05/31/18 0451  WBC 6.5   < > 6.0 11.6*   < > 9.6 7.9 8.0  NEUTROABS 4,310  --  4,194 9.7*  --   --   --   --   HGB 14.8   < > 12.1 6.5*   < > 6.3* 8.6* 8.3*  HCT 43.6   < > 36.0 21.3*   < > 19.4* 25.9* 24.6*  MCV 94.8   < > 95.0 104.9*   < > 99.5 96.3 96.5  PLT 170   < > 142 131*   < > 106* 115* 117*   < > = values in this interval not displayed.   Lipid Panel: Recent Labs    06/11/17 1100  CHOL 199  HDL 68  LDLCALC 114*  TRIG 80  CHOLHDL 2.9   Lab Results  Component Value Date   HGBA1C 5.5 03/22/2018    Procedures since last visit: Ct Head Wo Contrast  Result Date: 05/29/2018 CLINICAL DATA:  Pt coming from home arrives via ems. Pt awoke this morning needing to have a BM and got very weak in her legs upon ambulating to the bathroom with walker. Pt BM was very dark. Pts husband and son helped her to the ground. HX of d.*comment was truncated*Head trauma, ataxia EXAM: CT HEAD WITHOUT CONTRAST CT CERVICAL SPINE WITHOUT CONTRAST TECHNIQUE: Multidetector CT imaging of the head and cervical spine was performed following the standard protocol without intravenous contrast. Multiplanar CT image reconstructions of the cervical spine were also generated. COMPARISON:  Head CT 05/21/2018 FINDINGS: CT HEAD FINDINGS Brain: No acute intracranial hemorrhage. No focal mass lesion. No CT evidence of acute infarction. No midline shift or mass effect. No hydrocephalus. Basilar cisterns are patent. There are periventricular and subcortical white matter hypodensities. Generalized  cortical atrophy. Vascular: No hyperdense vessel or unexpected calcification. Skull: Normal. Negative for fracture or focal lesion. Sinuses/Orbits: Paranasal sinuses and mastoid air cells are clear. Orbits are clear. Other: None. CT CERVICAL SPINE FINDINGS Alignment: Normal alignment of the cervical vertebral bodies. Skull base and vertebrae: Normal craniocervical junction. No loss of vertebral body height or disc height. Normal facet articulation. No evidence of fracture. Soft tissues and spinal canal: No prevertebral soft tissue swelling. No perispinal or epidural hematoma. Disc levels: Mild endplate spurring. Disc space narrowing at C5-C6. Upper chest: Clear Other: None IMPRESSION: 1. No acute intracranial findings. 2. Atrophy and white matter microvascular disease. 3. No cervical spine fracture. 4. Moderate disc osteophytic disease. Electronically Signed   By: Suzy Bouchard M.D.   On: 05/29/2018 08:43   Ct Cervical Spine Wo Contrast  Result Date: 05/29/2018 CLINICAL DATA:  Pt coming from home arrives via ems. Pt awoke this morning needing to have a BM and got very weak in her legs upon ambulating to the bathroom with walker. Pt BM was very dark. Pts husband and son helped her to the ground. HX of d.*comment was truncated*Head trauma, ataxia EXAM: CT HEAD WITHOUT CONTRAST CT CERVICAL SPINE WITHOUT CONTRAST TECHNIQUE: Multidetector CT imaging of the head and cervical spine was performed following the standard protocol without intravenous contrast. Multiplanar CT image reconstructions of the cervical spine were also generated. COMPARISON:  Head CT 05/21/2018 FINDINGS: CT HEAD FINDINGS Brain: No acute intracranial hemorrhage. No focal mass lesion. No CT evidence of acute infarction. No midline shift or mass effect. No hydrocephalus. Basilar cisterns are patent. There are periventricular and subcortical white matter hypodensities. Generalized cortical atrophy. Vascular: No hyperdense vessel or unexpected  calcification. Skull: Normal. Negative for fracture or focal lesion. Sinuses/Orbits: Paranasal sinuses and mastoid air cells are clear. Orbits are clear. Other: None. CT CERVICAL SPINE FINDINGS Alignment: Normal alignment of the cervical vertebral bodies. Skull base and vertebrae: Normal craniocervical junction. No loss of vertebral body height or disc height. Normal facet articulation. No evidence of fracture. Soft tissues and spinal canal: No prevertebral soft tissue swelling. No perispinal or epidural hematoma. Disc levels: Mild endplate spurring. Disc space narrowing at C5-C6. Upper chest: Clear Other: None IMPRESSION: 1. No acute intracranial findings. 2. Atrophy and white matter microvascular disease. 3. No cervical spine fracture. 4. Moderate disc osteophytic disease. Electronically Signed   By: Suzy Bouchard M.D.   On: 05/29/2018 08:43   Dg Chest Port 1 View  Result Date: 05/29/2018 CLINICAL DATA:  Pt awoke this morning needing to have a BM and got very weak in her legs upon ambulating to the bathroom with walker. H/o dementia.weakness EXAM: PORTABLE CHEST 1 VIEW COMPARISON:  05/21/2018 FINDINGS: Normal mediastinum and cardiac silhouette. Lungs are hyperinflated. Normal pulmonary vasculature. No evidence of effusion, infiltrate, or pneumothorax. No acute bony abnormality. IMPRESSION: hyperinflated lungs.  No acute findings. Electronically Signed   By: Suzy Bouchard M.D.   On: 05/29/2018 08:10    Assessment/Plan 1. Acute blood loss anemia - hgb improved to 8.3 last check -f/u cbc 5/27 so available for her visit with GI  -suggested prenatal vitamin use rather than using iron supplement--should trend up on its own but this might expedite the process  -  CBC with Differential/Platelet; Future  2. Ulcer of pyloric antrum, unspecified chronicity -per endoscopy -biopsies benign -cont protonix -f/u with GI on 6/1 and then for repeat endoscopy  3. Acute pancreatitis, unspecified complication  status, unspecified pancreatitis type - only lipase elevated, but considerably before hospitalization--was up to 140 in our office; unclear why, so will f/u to ensure improved, but CT abd/pelvis showed normal pancreas - Lipase; Future - Amylase; Future  4. Late onset Alzheimer's disease without behavioral disturbance ([place]) -progressing with more sundowning challenges -her husband is very supportive -her son lives downstairs and now her daughter is furloughed and helping to check on them and be sure everything is being done at home -she is getting home health PT, OT also  5. Chronic kidney disease (CKD), stage IV (severe) ([facility]) -Avoid nephrotoxic agents like nsaids, dose adjust renally excreted meds, hydrate.  6. Hypotension due to blood loss -amlodipine was stopped -she's now on only bystolic 5mg  -oddly her bps had been running quite high much of the time and we were struggling to get them controlled before this happened -if bps stay under 100 even as her blood counts improve, we may need to decrease or even stop the bystolic but it was also helping her palpitations  Labs/tests ordered:   Orders Placed This Encounter  Procedures  . CBC with Differential/Platelet    Standing Status:   Future    Standing Expiration Date:   06/03/2019  . Lipase    Standing Status:   Future    Standing Expiration Date:   06/03/2019  . Amylase    Standing Status:   Future    Standing Expiration Date:   06/03/2019    Next appt:  07/29/2018  Medications were reconciled from pre-admission, discharge and current state.  Non face-to-face time spent on telehealth visit:  31 minutes  Erinn Mendosa L. Haroun Cotham, D.O. [facility] [address], [place] [address] Cell Phone (Mon-Fri 8am-5pm):  [phone] On Call:  [phone] & follow prompts after 5pm & weekends Office Phone:  [phone] Office Fax:  [phone]

## 2018-06-03 NOTE — Patient Instructions (Addendum)
Call me if your blood pressures are running under 90s.  I don't want to stop bystolic altogether because of the palpitations.  Begin prenatal vitamins for your anemia for just a few months. We will check your blood counts and pancreatic enzymes on 5/27 at 10am.  Then these will be available for GI to review, as well.  Keep your July appointment with me.  We will talk about the MOST form at that time and get your code status form on file through vynca.  The living will and power of attorney documents are on file already.

## 2018-06-07 NOTE — Anesthesia Postprocedure Evaluation (Signed)
Anesthesia Post Note  Patient: Genowefa Morga  Procedure(s) Performed: ESOPHAGOGASTRODUODENOSCOPY (EGD) WITH PROPOFOL (N/A ) BIOPSY     Patient location during evaluation: Endoscopy Anesthesia Type: MAC Level of consciousness: awake and alert Pain management: pain level controlled Vital Signs Assessment: post-procedure vital signs reviewed and stable Respiratory status: spontaneous breathing, nonlabored ventilation, respiratory function stable and patient connected to nasal cannula oxygen Cardiovascular status: stable and blood pressure returned to baseline Postop Assessment: no apparent nausea or vomiting Anesthetic complications: no    Last Vitals:  Vitals:   05/31/18 0509 05/31/18 1427  BP: 122/68 (!) 96/57  Pulse: (!) 58 60  Resp: 18 15  Temp: 36.7 C 37.4 C  SpO2: 98% 96%    Last Pain:  Vitals:   05/31/18 1427  TempSrc: Oral  PainSc:                  Jossiah Smoak

## 2018-06-16 ENCOUNTER — Other Ambulatory Visit: Payer: Self-pay

## 2018-06-16 ENCOUNTER — Other Ambulatory Visit: Payer: Medicare Other

## 2018-06-16 DIAGNOSIS — D62 Acute posthemorrhagic anemia: Secondary | ICD-10-CM

## 2018-06-16 DIAGNOSIS — K859 Acute pancreatitis without necrosis or infection, unspecified: Secondary | ICD-10-CM

## 2018-06-17 LAB — CBC WITH DIFFERENTIAL/PLATELET
Absolute Monocytes: 475 cells/uL (ref 200–950)
Basophils Absolute: 38 cells/uL (ref 0–200)
Basophils Relative: 0.9 %
Eosinophils Absolute: 88 cells/uL (ref 15–500)
Eosinophils Relative: 2.1 %
HCT: 28.9 % — ABNORMAL LOW (ref 35.0–45.0)
Hemoglobin: 9.5 g/dL — ABNORMAL LOW (ref 11.7–15.5)
Lymphs Abs: 932 cells/uL (ref 850–3900)
MCH: 31.7 pg (ref 27.0–33.0)
MCHC: 32.9 g/dL (ref 32.0–36.0)
MCV: 96.3 fL (ref 80.0–100.0)
MPV: 11.1 fL (ref 7.5–12.5)
Monocytes Relative: 11.3 %
Neutro Abs: 2667 cells/uL (ref 1500–7800)
Neutrophils Relative %: 63.5 %
Platelets: 158 10*3/uL (ref 140–400)
RBC: 3 10*6/uL — ABNORMAL LOW (ref 3.80–5.10)
RDW: 12.8 % (ref 11.0–15.0)
Total Lymphocyte: 22.2 %
WBC: 4.2 10*3/uL (ref 3.8–10.8)

## 2018-06-17 LAB — AMYLASE: Amylase: 58 U/L (ref 21–101)

## 2018-06-17 LAB — LIPASE: Lipase: 114 U/L — ABNORMAL HIGH (ref 7–60)

## 2018-06-21 ENCOUNTER — Telehealth (INDEPENDENT_AMBULATORY_CARE_PROVIDER_SITE_OTHER): Payer: Medicare Other | Admitting: Gastroenterology

## 2018-06-21 ENCOUNTER — Encounter: Payer: Self-pay | Admitting: *Deleted

## 2018-06-21 ENCOUNTER — Other Ambulatory Visit: Payer: Self-pay

## 2018-06-21 ENCOUNTER — Encounter: Payer: Self-pay | Admitting: Gastroenterology

## 2018-06-21 ENCOUNTER — Ambulatory Visit: Payer: Medicare Other | Admitting: Gastroenterology

## 2018-06-21 VITALS — Ht 64.0 in | Wt 110.0 lb

## 2018-06-21 DIAGNOSIS — G301 Alzheimer's disease with late onset: Secondary | ICD-10-CM

## 2018-06-21 DIAGNOSIS — K2990 Gastroduodenitis, unspecified, without bleeding: Secondary | ICD-10-CM

## 2018-06-21 DIAGNOSIS — D62 Acute posthemorrhagic anemia: Secondary | ICD-10-CM

## 2018-06-21 DIAGNOSIS — K279 Peptic ulcer, site unspecified, unspecified as acute or chronic, without hemorrhage or perforation: Secondary | ICD-10-CM

## 2018-06-21 DIAGNOSIS — R748 Abnormal levels of other serum enzymes: Secondary | ICD-10-CM

## 2018-06-21 DIAGNOSIS — F0280 Dementia in other diseases classified elsewhere without behavioral disturbance: Secondary | ICD-10-CM

## 2018-06-21 DIAGNOSIS — K259 Gastric ulcer, unspecified as acute or chronic, without hemorrhage or perforation: Secondary | ICD-10-CM | POA: Diagnosis not present

## 2018-06-21 DIAGNOSIS — K2970 Gastritis, unspecified, without bleeding: Secondary | ICD-10-CM

## 2018-06-21 NOTE — Patient Instructions (Addendum)
To help prevent the possible spread of infection to our patients, communities, and staff; we will be implementing the following measures:  As of now we are not allowing any visitors/family members to accompany you to any upcoming appointments with [facility]. If you have any concerns about this please contact our office to discuss prior to the appointment.   You have been scheduled for an endoscopy. Please follow written instructions given to you at your visit today. If you use inhalers (even only as needed), please bring them with you on the day of your procedure. Your physician has requested that you go to www.startemmi.com and enter the access code given to you at your visit today. This web site gives a general overview about your procedure. However, you should still follow specific instructions given to you by our office regarding your preparation for the procedure.  Your provider has requested that you go to the basement level for lab work in 2 months at the [facility] office. Press "B" on the elevator. The lab is located at the first door on the left as you exit the elevator.  Please call to schedule follow up visit on 3 to 6 months at [phone].

## 2018-06-21 NOTE — Progress Notes (Signed)
Chief Complaint: Hospital follow-up, melena, symptomatic anemia, PUD  Referring Provider:     Gayland Curry, DO   HPI:    Due to current restrictions/limitations of in-office visits due to the COVID-19 pandemic, this scheduled clinical appointment was converted to a telehealth consultation via telephone.  -Time of medical discussion: 25 minutes -The patient did consent to this telephone visit and is aware of possible charges through their insurance for this visit.  -Names of all parties present: Lindsey Salazar (patient), Lindsey Salazar (patient's daughter), Gerrit Heck, DO, [facility] (physician)  Lindsey Salazar is a 85 y.o. female with a history of dementia, CAD, hypertension, CKD 4, thrombocytopenia, referred to the Gastroenterology Clinic for hospital follow-up.  Admitted 5/9-11 with melena, abdominal pain, and symptomatic anemia with fall at home.  Hemoglobin 6.5 on arrival (baseline 15) with elevated BUN of 85 (baseline 30s).  Transfused 2 units PRBCs, and EGD notable for 2 large antral ulcers with heaped up, edematous, erythematous area in the gastric antrum.  Initial concerns were for malignant etiology, but biopsies demonstrated only inflammatory changes/reactive gastropathy, negative H. pylori.  Treated with high-dose PPI and discharged home.  Daughter, Lindsey Salazar, supplies history today due to dementia. Today she states she is doing well. Good PO intake, weight stable. No abdominal pain, melena, lightheadness Walking with PT without walker. Takes PNV with iron.  Take Protonix as prescribed.  Scheduled for repeat EGD later this month.  Repeat hemoglobin last week improving to 9.5.  Interestingly with mildly elevated lipase at 114, from 140 while inpatient, with normal amylase.  CT with otherwise normal-appearing pancreas.  Suspect secondary to localized edema from the gastric antrum.  Endoscopic history: -EGD (05/30/2018, Dr. Bryan Lemma): Large, edematous,  erythematous, ulcerated masslike lesion in the gastric antrum and prepyloric stomach with 2 distinct, large, cratered ulcers.  Several biopsies obtained and notable for inflammatory changes without malignancy or dysplasia.  Past medical history, past surgical history, social history, family history, medications, and allergies reviewed in the chart and with patient over the phone.  Past Medical History:  Diagnosis Date  . Acute bronchitis   . Benign neoplasm of uterus, part unspecified   . Chronic ischemic heart disease, unspecified   . Chronic kidney disease, stage II (mild)   . Cough   . Encounter for long-term (current) use of other medications   . Fall 01/30/2016  . Ganglion, unspecified   . Hearing loss   . Hyperlipidemia   . Hypertension   . Impacted cerumen   . Light-headed feeling 09/25/2015  . Macular degeneration (senile) of retina, unspecified   . Mitral valve disorders(424.0)   . Nonspecific abnormal electrocardiogram (ECG) (EKG)   . Osteoporosis, unspecified   . Other abnormal blood chemistry   . Other emphysema ([place])   . Other malaise and fatigue   . Palpitations   . Senile cataract, unspecified   . Senile purpura ([place]) 06/06/2015  . Thrombocytopenia, unspecified ([place])   . Unspecified hearing loss      Past Surgical History:  Procedure Laterality Date  . BIOPSY  05/30/2018   Procedure: BIOPSY;  Surgeon: Lavena Bullion, DO;  Location: [facility];  Service: Gastroenterology;;  . ESOPHAGOGASTRODUODENOSCOPY (EGD) WITH PROPOFOL N/A 05/30/2018   Procedure: ESOPHAGOGASTRODUODENOSCOPY (EGD) WITH PROPOFOL;  Surgeon: Lavena Bullion, DO;  Location: [facility];  Service: Gastroenterology;  Laterality: N/A;  . KNEE ARTHROSCOPY     right   Family History  Problem Relation Age of Onset  . Stroke Brother   . Cancer Sister        lung  . Parkinsonism Father        Alzheimer's  . Colon cancer Neg Hx   . Esophageal cancer Neg Hx    Social History   Tobacco Use   . Smoking status: Never Smoker  . Smokeless tobacco: Never Used  Substance Use Topics  . Alcohol use: No  . Drug use: No   Current Outpatient Medications  Medication Sig Dispense Refill  . atorvastatin (LIPITOR) 10 MG tablet TAKE 1 TABLET ONCE DAILY TO LOWER CHOLESTEROL. 90 tablet 1  . Cholecalciferol (VITAMIN D3) 50 MCG (2000 UT) capsule Take 1 capsule (2,000 Units total) by mouth daily. 30 capsule 5  . donepezil (ARICEPT) 10 MG tablet TAKE ONE TABLET AT BEDTIME. 90 tablet 1  . Ensure (ENSURE) Take 1 Can by mouth daily.    . memantine (NAMENDA XR) 28 MG CP24 24 hr capsule TAKE 1 CAPSULE DAILY. 90 capsule 1  . nebivolol (BYSTOLIC) 5 MG tablet Take 1 tablet (5 mg total) by mouth daily. 90 tablet 1  . pantoprazole (PROTONIX) 40 MG tablet Take 1 tablet (40 mg total) by mouth 2 (two) times daily before a meal. 60 tablet 1  . Prenatal Vit-Fe Fumarate-FA (PRENATAL PO) Take 1 tablet by mouth daily.    Marland Kitchen Specialty Vitamins Products (LIPOTRIAD VISION SUPPORT PO) Take 1 tablet by mouth daily.     No current facility-administered medications for this visit.    Allergies  Allergen Reactions  . Penicillins Nausea And Vomiting and Other (See Comments)    Did it involve swelling of the face/tongue/throat, SOB, or low BP? Unknown Did it involve sudden or severe rash/hives, skin peeling, or any reaction on the inside of your mouth or nose? Unknown Did you need to seek medical attention at a hospital or doctor's office? Unknown When did it last happen?unk If all above answers are "NO", may proceed with cephalosporin use.       Review of Systems: All systems reviewed and negative except where noted in HPI.     Physical Exam:    Physical exam not completed due to the nature of this telehealth communication.  Patient was otherwise alert and oriented and well communicative.   ASSESSMENT AND PLAN;   1) PVD 2) Gastritis 3) Acute blood loss anemia  -Resume Protonix 40 mg p.o. twice  daily x8 weeks, then plan to reduce to 40 mg daily - Continue PNV with iron - EGD later this month to evaluate for ulcer healing.  Also evaluate for reduction in edematous mucosa, with repeat biopsies as indicated at that time -Repeat CBC with iron panel in 2 months -Baseline hemoglobin around 15 -Given underlying dementia, may need to continue serial CBC checks in the long-term given her decreased reliability to report overt blood loss or even abdominal pain  4) Elevated lipase: Suspect secondary to localized irritation from significant PUD.  Pancreas otherwise normal-appearing on CT, with normal PD.  Tolerating p.o. intake, not endorsing abdominal pain or acholic stools  5) Alzheimer's dementia: -Daughter will accompany her during EGD later this month, and be present for preop and recovery, with postop instructions.  Advised her to bring facemask.  The indications, risks, and benefits of EGD were explained to the patient in detail. Risks include but are not limited to bleeding, perforation, adverse reaction to medications, and cardiopulmonary compromise. Sequelae include but are not limited to the  possibility of surgery, hositalization, and mortality. The patient verbalized understanding and wished to proceed. All questions answered, referred to scheduler. Further recommendations pending results of the exam.   I spent a total of 25 minutes of time with the patient and her daughter. Greater than 50% of the time was spent counseling and coordinating care.    Lavena Bullion, DO, FACG  06/21/2018, 8:40 AM   Reed, Tiffany L, DO

## 2018-06-25 ENCOUNTER — Other Ambulatory Visit: Payer: Self-pay

## 2018-06-25 ENCOUNTER — Ambulatory Visit (AMBULATORY_SURGERY_CENTER): Payer: Self-pay | Admitting: *Deleted

## 2018-06-25 VITALS — Ht 64.0 in | Wt 110.0 lb

## 2018-06-25 DIAGNOSIS — K259 Gastric ulcer, unspecified as acute or chronic, without hemorrhage or perforation: Secondary | ICD-10-CM

## 2018-06-25 DIAGNOSIS — K279 Peptic ulcer, site unspecified, unspecified as acute or chronic, without hemorrhage or perforation: Secondary | ICD-10-CM

## 2018-06-25 NOTE — Progress Notes (Signed)
Patient's pre-visit was done today over the phone with the patient's daughter Jenny(medical POA)-patient has Alzheimer.. Name,DOB and address verified. Insurance verified. Packet of Prep instructions mailed to patient including copy of a consent form and pre-procedure patient acknowledgement form-pt is aware. Patient understands to call us back with any questions or concerns.   Patient'daughter denies any allergies to eggs or soy, denies any problems with anesthesia/sedation, denies any oxygen use at home,& denies taking any diet/weight loss medications or blood thinners. EMMI education assisgned to patient on EGD, this was explained and instructions given to patient.  Daughter will come to [facility] with the patient due to Alzheimer. They both will wear masks.

## 2018-06-26 ENCOUNTER — Other Ambulatory Visit: Payer: Self-pay | Admitting: Internal Medicine

## 2018-06-29 ENCOUNTER — Encounter: Payer: Self-pay | Admitting: Gastroenterology

## 2018-07-05 ENCOUNTER — Telehealth: Payer: Self-pay | Admitting: *Deleted

## 2018-07-05 NOTE — Telephone Encounter (Signed)
Covid-19 screening questions   Do you now or have you had a fever in the last 14 days?no  Do you have any respiratory symptoms of shortness of breath or cough now or in the last 14 days?no  Do you have any family members or close contacts with diagnosed or suspected Covid-19 in the past 14 days?no  Have you been tested for Covid-19 and found to be positive?no  PT aware of care partner policy and her husband will be bringing a mask with him to sit in the lobby. SM

## 2018-07-06 ENCOUNTER — Encounter: Payer: Self-pay | Admitting: *Deleted

## 2018-07-06 ENCOUNTER — Ambulatory Visit (AMBULATORY_SURGERY_CENTER): Payer: Medicare Other | Admitting: Gastroenterology

## 2018-07-06 ENCOUNTER — Encounter: Payer: Self-pay | Admitting: Gastroenterology

## 2018-07-06 ENCOUNTER — Other Ambulatory Visit: Payer: Self-pay

## 2018-07-06 VITALS — BP 127/74 | HR 81 | Temp 97.9°F | Resp 18 | Ht 64.0 in | Wt 110.0 lb

## 2018-07-06 DIAGNOSIS — K259 Gastric ulcer, unspecified as acute or chronic, without hemorrhage or perforation: Secondary | ICD-10-CM

## 2018-07-06 DIAGNOSIS — K2970 Gastritis, unspecified, without bleeding: Secondary | ICD-10-CM | POA: Diagnosis not present

## 2018-07-06 MED ORDER — PANTOPRAZOLE SODIUM 40 MG PO TBEC: 40.0000 mg | DELAYED_RELEASE_TABLET | Freq: Two times a day (BID) | ORAL | 2 refills | Status: DC

## 2018-07-06 MED ORDER — SODIUM CHLORIDE 0.9 % IV SOLN: 500.0000 mL | Freq: Once | INTRAVENOUS | Status: DC

## 2018-07-06 NOTE — Progress Notes (Signed)
Called to room to assist during endoscopic procedure.  Patient ID and intended procedure confirmed with present staff. Received instructions for my participation in the procedure from the performing physician.  

## 2018-07-06 NOTE — Patient Instructions (Signed)
Thank you for allowing [place] to care for you today!  Await pathology results by mail approximately 2 weeks.  Return to GI clinic in 3 months.  Continue Protonix 2 times / day for 4 weeks.  Then reduce to 40 mg daily.  If no further suspicion of bleeding, Dr Bryan Lemma will further reduce the dosage after follow up appointment in 3 months.  Resume previous diet and medications today.  Return to normal activities tomorrow.        YOU HAD AN ENDOSCOPIC PROCEDURE TODAY AT [facility]:   Refer to the procedure report that was given to you for any specific questions about what was found during the examination.  If the procedure report does not answer your questions, please call your gastroenterologist to clarify.  If you requested that your care partner not be given the details of your procedure findings, then the procedure report has been included in a sealed envelope for you to review at your convenience later.  YOU SHOULD EXPECT: Some feelings of bloating in the abdomen. Passage of more gas than usual.  Walking can help get rid of the air that was put into your GI tract during the procedure and reduce the bloating. If you had a lower endoscopy (such as a colonoscopy or flexible sigmoidoscopy) you may notice spotting of blood in your stool or on the toilet paper. If you underwent a bowel prep for your procedure, you may not have a normal bowel movement for a few days.  Please Note:  You might notice some irritation and congestion in your nose or some drainage.  This is from the oxygen used during your procedure.  There is no need for concern and it should clear up in a day or so.  SYMPTOMS TO REPORT IMMEDIATELY:    Following upper endoscopy (EGD)  Vomiting of blood or coffee ground material  New chest pain or pain under the shoulder blades  Painful or persistently difficult swallowing  New shortness of breath  Fever of 100F or higher  Black, tarry-looking stools  For  urgent or emergent issues, a gastroenterologist can be reached at any hour by calling [phone].   DIET:  We do recommend a small meal at first, but then you may proceed to your regular diet.  Drink plenty of fluids but you should avoid alcoholic beverages for 24 hours.  ACTIVITY:  You should plan to take it easy for the rest of today and you should NOT DRIVE or use heavy machinery until tomorrow (because of the sedation medicines used during the test).    FOLLOW UP: Our staff will call the number listed on your records 48-72 hours following your procedure to check on you and address any questions or concerns that you may have regarding the information given to you following your procedure. If we do not reach you, we will leave a message.  We will attempt to reach you two times.  During this call, we will ask if you have developed any symptoms of COVID 19. If you develop any symptoms (ie: fever, flu-like symptoms, shortness of breath, cough etc.) before then, please call [phone].  If you test positive for Covid 19 in the 2 weeks post procedure, please call and report this information to [place].    If any biopsies were taken you will be contacted by phone or by letter within the next 1-3 weeks.  Please call us at [phone] if you have not heard about the biopsies  in 3 weeks.    SIGNATURES/CONFIDENTIALITY: You and/or your care partner have signed paperwork which will be entered into your electronic medical record.  These signatures attest to the fact that that the information above on your After Visit Summary has been reviewed and is understood.  Full responsibility of the confidentiality of this discharge information lies with you and/or your care-partner.

## 2018-07-06 NOTE — Progress Notes (Signed)
Report given to PACU, vss 

## 2018-07-06 NOTE — Op Note (Signed)
[facility] Patient Name: Lindsey Salazar Procedure Date: 07/06/2018 8:49 AM MRN: [id number] Endoscopist: Gerrit Heck , MD Age: 85 Referring MD:  Date of Birth: May 27, 1932 Gender: Female Account #: [id number] Procedure:                Upper GI endoscopy Indications:              Follow-up of gastric ulcer with hemorrhage,                            Follow-up of gastritis                           85 yo female admitted May 2020 with UGIB secondary                            to to large antral ulcers and severe mucosal                            erythema/edema. Treated with high dose PPI without                            recurrence of bleeding, she presents today to                            evaluate for appropriate mucosal healing. Medicines:                Monitored Anesthesia Care Procedure:                Pre-Anesthesia Assessment:                           - Prior to the procedure, a History and Physical                            was performed, and patient medications and                            allergies were reviewed. The patient's tolerance of                            previous anesthesia was also reviewed. The risks                            and benefits of the procedure and the sedation                            options and risks were discussed with the patient.                            All questions were answered, and informed consent                            was obtained. Prior Anticoagulants: The patient has  taken no previous anticoagulant or antiplatelet                            agents. ASA Grade Assessment: III - A patient with                            severe systemic disease. After reviewing the risks                            and benefits, the patient was deemed in                            satisfactory condition to undergo the procedure.                           After obtaining informed consent, the  endoscope was                            passed under direct vision. Throughout the                            procedure, the patient's blood pressure, pulse, and                            oxygen saturations were monitored continuously. The                            Model GIF-[id number]) scope was introduced                            through the mouth, and advanced to the second part                            of duodenum. The upper GI endoscopy was                            accomplished without difficulty. The patient                            tolerated the procedure well. Scope In: Scope Out: Findings:                 The examined esophagus was normal.                           One non-bleeding cratered gastric ulcer with no                            stigmata of bleeding was found in the gastric                            antrum. The lesion was 10 mm in largest dimension,  which was smaller than previous. The 2nd previously                            noted ulcer had since healed, with a well healed                            ulcer bed. Biopsies were taken with a cold forceps                            for histology. Estimated blood loss was minimal.                           Localized mildly congested mucosa was found in the                            gastric antrum. This was much improved from                            previous. Additional mucosal biopsies were taken                            with a cold forceps for histology. Estimated blood                            loss was minimal.                           The cardia, gastric fundus, gastric body and                            incisura were normal.                           The duodenal bulb, first portion of the duodenum                            and second portion of the duodenum were normal. Complications:            No immediate complications. Estimated Blood Loss:     Estimated  blood loss was minimal. Impression:               - Normal esophagus.                           - Non-bleeding gastric ulcer with no stigmata of                            bleeding. Biopsied.                           - Congestive gastropathy. Biopsied.                           - Normal cardia, gastric fundus, gastric body and  incisura.                           - Normal duodenal bulb, first portion of the                            duodenum and second portion of the duodenum.                           - Overall much improved from the previous upper                            endoscopy, with decrease in size of one of the                            ulcers and complete healing of the 2nd ulcer, with                            much improved mucosal edema and near resolution of                            the previously noted mucosal erythema. Plan to                            continue high dose acid suppression therapy for                            another 4 weeks, then reduce to daily PPI for                            continued gastric prophylaxis. Recommendation:           - Patient has a contact number available for                            emergencies. The signs and symptoms of potential                            delayed complications were discussed with the                            patient. Return to normal activities tomorrow.                            Written discharge instructions were provided to the                            patient.                           - Resume previous diet.                           - Continue present medications.                           -  Await pathology results.                           - Repeat upper endoscopy PRN.                           - Return to GI clinic in 3 months.                           - Continue Protonix (pantoprazole) 40 mg PO BID for                            another 4 weeks to promote ulcer  healing, then                            reduce to Protonix 40 mg daily for continued                            gastric prophylaxis. If no further suspicion of                            bleeding, can then reduce to 20 mg daily for long                            term gastric prophylaxis. Gerrit Heck, MD 07/06/2018 9:26:06 AM

## 2018-07-08 ENCOUNTER — Telehealth: Payer: Self-pay | Admitting: *Deleted

## 2018-07-08 NOTE — Telephone Encounter (Signed)
  Follow up Call-  Call back number 07/06/2018  Post procedure Call Back phone  # [phone]  Permission to leave phone message Yes  Some recent data might be hidden     Patient questions:  Do you have a fever, pain , or abdominal swelling? No. Pain Score  0 *  Have you tolerated food without any problems? Yes.    Have you been able to return to your normal activities? Yes.    Do you have any questions about your discharge instructions: Diet   No. Medications  No. Follow up visit  No.  Do you have questions or concerns about your Care? No.  Actions: * If pain score is 4 or above: No action needed, pain <4.   1. Have you developed a fever since your procedure? NO  2.   Have you had an respiratory symptoms (SOB or cough) since your procedure? NO  3.   Have you tested positive for COVID 19 since your procedure NO  4.   Have you had any family members/close contacts diagnosed with the COVID 19 since your procedure?  NO   If yes to any of these questions please route to Joylene John, RN and Alphonsa Gin, RN.

## 2018-07-09 ENCOUNTER — Encounter: Payer: Self-pay | Admitting: Gastroenterology

## 2018-07-17 ENCOUNTER — Other Ambulatory Visit: Payer: Self-pay | Admitting: Internal Medicine

## 2018-07-29 ENCOUNTER — Ambulatory Visit (INDEPENDENT_AMBULATORY_CARE_PROVIDER_SITE_OTHER): Payer: Medicare Other | Admitting: Internal Medicine

## 2018-07-29 ENCOUNTER — Encounter: Payer: Self-pay | Admitting: Internal Medicine

## 2018-07-29 ENCOUNTER — Other Ambulatory Visit: Payer: Self-pay

## 2018-07-29 VITALS — BP 180/90 | HR 56 | Temp 98.0°F | Ht 64.0 in | Wt 108.0 lb

## 2018-07-29 DIAGNOSIS — D692 Other nonthrombocytopenic purpura: Secondary | ICD-10-CM

## 2018-07-29 DIAGNOSIS — N184 Chronic kidney disease, stage 4 (severe): Secondary | ICD-10-CM

## 2018-07-29 DIAGNOSIS — F0280 Dementia in other diseases classified elsewhere without behavioral disturbance: Secondary | ICD-10-CM

## 2018-07-29 DIAGNOSIS — I10 Essential (primary) hypertension: Secondary | ICD-10-CM

## 2018-07-29 DIAGNOSIS — D62 Acute posthemorrhagic anemia: Secondary | ICD-10-CM

## 2018-07-29 DIAGNOSIS — G301 Alzheimer's disease with late onset: Secondary | ICD-10-CM | POA: Diagnosis not present

## 2018-07-29 DIAGNOSIS — K259 Gastric ulcer, unspecified as acute or chronic, without hemorrhage or perforation: Secondary | ICD-10-CM | POA: Diagnosis not present

## 2018-07-29 NOTE — Progress Notes (Signed)
Location:  [facility] clinic Provider:  Bella Brummet L. Mariea Clonts, D.O., C.M.D.  Code Status: DNR Goals of Care:  Advanced Directives 06/03/2018  Does Patient Have a Medical Advance Directive? Yes  Type of Paramedic of [place];Living will  Does patient want to make changes to medical advance directive? No - Patient declined  Copy of [place] in Chart? Yes - validated most recent copy scanned in chart (See row information)     Chief Complaint  Patient presents with  . Medical Management of Chronic Issues    41mth follow-up    HPI: Patient is a 85 y.o. female with h/o recent GI bleed in late spring from ulcers, dementia, oscillating bp seen today for medical management of chronic diseases.    She is doing ok, feels well.  She's cold here in the office.    BP sky high here this am.  Her daughter has the log.  It's all over the PJASN--[id number] systolic.  She has no pain.  She slept well.  She's eating well.  She's not stressed or anxious.  They are eating a lot more frozen meals now.  Her daughter's been unable to do the meal prep like she did since she's back to work.  They are hydrating a little better.    She's not getting the exercise she was getting.  They're walking a little bit and she does her PT daily with her husband.  They walk in the church parking lot across the street--they go in the mornings.      No recent abdominal pains or GERD and eating well.  Once ulcer gone and one half-size now.  protonix will be reduced to once a day when they finish the bottle.    Her daughter says they are staying on the even keel.  There's some more frequent sundowning--she wanders a little after bedtime, but does go back to bed.    Past Medical History:  Diagnosis Date  . Acute bronchitis   . Alzheimer disease ([place])   . Anemia   . Benign neoplasm of uterus, part unspecified   . Chronic ischemic heart disease, unspecified   . Chronic kidney disease, stage II  (mild)   . Cough   . Encounter for long-term (current) use of other medications   . Fall 01/30/2016  . Ganglion, unspecified   . Hearing loss   . Heart murmur   . Hyperlipidemia   . Hypertension   . Impacted cerumen   . Light-headed feeling 09/25/2015  . Macular degeneration (senile) of retina, unspecified   . Mitral valve disorders(424.0)   . Nonspecific abnormal electrocardiogram (ECG) (EKG)   . Osteoporosis, unspecified   . Other abnormal blood chemistry   . Other emphysema ([place])   . Other malaise and fatigue   . Palpitations   . Senile cataract, unspecified   . Senile purpura ([place]) 06/06/2015  . Thrombocytopenia, unspecified ([place])   . Unspecified hearing loss     Past Surgical History:  Procedure Laterality Date  . BIOPSY  05/30/2018   Procedure: BIOPSY;  Surgeon: Lavena Bullion, DO;  Location: [facility];  Service: Gastroenterology;;  . ESOPHAGOGASTRODUODENOSCOPY (EGD) WITH PROPOFOL N/A 05/30/2018   Procedure: ESOPHAGOGASTRODUODENOSCOPY (EGD) WITH PROPOFOL;  Surgeon: Lavena Bullion, DO;  Location: [place];  Service: Gastroenterology;  Laterality: N/A;  . KNEE ARTHROSCOPY     right    Allergies  Allergen Reactions  . Penicillins Nausea And Vomiting and Other (See Comments)  Did it involve swelling of the face/tongue/throat, SOB, or low BP? Unknown Did it involve sudden or severe rash/hives, skin peeling, or any reaction on the inside of your mouth or nose? Unknown Did you need to seek medical attention at a hospital or doctor's office? Unknown When did it last happen?unk If all above answers are "NO", may proceed with cephalosporin use.      Outpatient Encounter Medications as of 07/29/2018  Medication Sig  . atorvastatin (LIPITOR) 10 MG tablet TAKE 1 TABLET ONCE DAILY TO LOWER CHOLESTEROL.  . BYSTOLIC 5 MG tablet TAKE 1 TABLET ONCE DAILY.  Marland Kitchen Cholecalciferol (VITAMIN D3) 50 MCG (2000 UT) capsule Take 1 capsule (2,000 Units total) by mouth daily.   Marland Kitchen donepezil (ARICEPT) 10 MG tablet TAKE ONE TABLET AT BEDTIME.  Marland Kitchen Ensure (ENSURE) Take 1 Can by mouth daily.  . memantine (NAMENDA XR) 28 MG CP24 24 hr capsule TAKE 1 CAPSULE DAILY.  . pantoprazole (PROTONIX) 40 MG tablet Take 1 tablet (40 mg total) by mouth 2 (two) times daily before a meal.  . Prenatal Vit-Fe Fumarate-FA (PRENATAL PO) Take 1 tablet by mouth daily.  Marland Kitchen Specialty Vitamins Products (LIPOTRIAD VISION SUPPORT PO) Take 1 tablet by mouth daily.   No facility-administered encounter medications on file as of 07/29/2018.     Review of Systems:  Review of Systems  Constitutional: Negative for chills, fever, malaise/fatigue and weight loss.  HENT: Positive for hearing loss.   Eyes: Negative for blurred vision.       Glasses  Respiratory: Negative for cough and shortness of breath.   Cardiovascular: Negative for chest pain, palpitations and leg swelling.  Gastrointestinal: Negative for abdominal pain, blood in stool, constipation, diarrhea, heartburn, melena, nausea and vomiting.  Genitourinary: Negative for dysuria.  Musculoskeletal: Negative for back pain, falls and joint pain.  Skin: Negative for rash.  Neurological: Negative for dizziness and loss of consciousness.  Endo/Heme/Allergies: Bruises/bleeds easily.  Psychiatric/Behavioral: Positive for memory loss. Negative for depression. The patient is not nervous/anxious and does not have insomnia.        Some recent "sundowning" and wandering behavior     Health Maintenance  Topic Date Due  . MAMMOGRAM  11/22/2017  . INFLUENZA VACCINE  08/21/2018  . TETANUS/TDAP  06/22/2024  . DEXA SCAN  Completed  . PNA vac Low Risk Adult  Completed    Physical Exam: Vitals:   07/29/18 0936  BP: (!) 180/90  Pulse: (!) 56  Temp: 98 F (36.7 C)  TempSrc: Oral  SpO2: 97%  Weight: 108 lb (49 kg)  Height: 5\' 4"  (1.626 m)   Body mass index is 18.54 kg/m. Physical Exam Vitals signs reviewed.  Constitutional:      General: She is  not in acute distress.    Appearance: Normal appearance. She is not ill-appearing or toxic-appearing.     Comments: Thin female  HENT:     Head: Normocephalic and atraumatic.     Ears:     Comments: HOH despite hearing aid  Eyes:     Comments: glasses  Cardiovascular:     Rate and Rhythm: Normal rate and regular rhythm.     Pulses: Normal pulses.     Heart sounds: Normal heart sounds.  Pulmonary:     Effort: Pulmonary effort is normal.     Breath sounds: Normal breath sounds.  Abdominal:     General: Bowel sounds are normal. There is no distension.     Palpations: Abdomen is soft. There is  no mass.     Tenderness: There is no abdominal tenderness. There is no guarding or rebound.  Musculoskeletal: Normal range of motion.  Skin:    General: Skin is warm and dry.     Coloration: Skin is pale.  Neurological:     General: No focal deficit present.     Mental Status: She is alert. Mental status is at baseline.     Gait: Gait normal.  Psychiatric:        Mood and Affect: Mood normal.     Labs reviewed: Basic Metabolic Panel: Recent Labs    05/29/18 0741 05/30/18 0351 05/31/18 0451  NA 146* 145 143  K 4.8 4.9 4.4  CL 115* 116* 112*  CO2 20* 22 21*  GLUCOSE 148* 96 80  BUN 85* 61* 39*  CREATININE 1.82* 1.79* 1.81*  CALCIUM 8.5* 8.0* 8.0*   Liver Function Tests: Recent Labs    05/21/18 1851 05/26/18 1001 05/29/18 0741  AST 20 15 14*  ALT 15 14 13   ALKPHOS 76  --  38  BILITOT 1.1 0.5 0.5  PROT 7.4 5.8* 4.9*  ALBUMIN 4.0  --  2.5*   Recent Labs    05/21/18 1851 05/26/18 1001 06/16/18 0959  LIPASE 63* 140* 114*  AMYLASE  --   --  58   No results for input(s): AMMONIA in the last 8760 hours. CBC: Recent Labs    05/26/18 1001 05/29/18 0741  05/30/18 1605 05/31/18 0451 06/16/18 0959  WBC 6.0 11.6*   < > 7.9 8.0 4.2  NEUTROABS 4,194 9.7*  --   --   --  2,667  HGB 12.1 6.5*   < > 8.6* 8.3* 9.5*  HCT 36.0 21.3*   < > 25.9* 24.6* 28.9*  MCV 95.0 104.9*    < > 96.3 96.5 96.3  PLT 142 131*   < > 115* 117* 158   < > = values in this interval not displayed.   Lipid Panel: No results for input(s): CHOL, HDL, LDLCALC, TRIG, CHOLHDL, LDLDIRECT in the last 8760 hours. Lab Results  Component Value Date   HGBA1C 5.5 03/22/2018   Assessment/Plan 1. Late onset Alzheimer's disease without behavioral disturbance ([place]) -progressing gradually -cont medication and family support -husband now ill also and family back to work so meals are not as healthy  2. Senile purpura ([place]) -remains an expected issue  3. Ulcer of pyloric antrum, unspecified chronicity -two ulcers, one healed and other healing on repeat endoscopy  4. Acute blood loss anemia -hgb improving, recheck in 4 wks  5. Chronic kidney disease (CKD), stage IV (severe) (H[facility]) -Avoid nephrotoxic agents like nsaids, dose adjust renally excreted meds, hydrate.  6. Essential hypertension -bp oscillates all over the board--see above -due to h/o orthostasis and fall risk, I'm hesitant to add back the amlodipine unless she is consistently over 619 systolic which is not the case  Labs/tests ordered:  Has cbc and iron panel ordered by GI to be done in 4 wks  Next appt:  She is to return to see me also when she has her AWV if possible to avoid too many visits here  [place]. Faheem Ziemann, D.O. [facility] [address], [place] [address] Cell Phone (Mon-Fri 8am-5pm):  [phone] On Call:  [phone] & follow prompts after 5pm & weekends Office Phone:  [phone] Office Fax:  [phone]

## 2018-08-03 ENCOUNTER — Other Ambulatory Visit: Payer: Self-pay | Admitting: Internal Medicine

## 2018-08-03 NOTE — Telephone Encounter (Signed)
Requested rx for Protonix was denied. RX was sent in by Dr.Ciriliano on 07/06/2018 for #60 with 2 refills (end date 09/04/2018). Patient is not due for any additional refills at this time based on record/documentation in Epic

## 2018-08-09 ENCOUNTER — Telehealth: Payer: Self-pay | Admitting: Gastroenterology

## 2018-08-09 ENCOUNTER — Other Ambulatory Visit: Payer: Self-pay

## 2018-08-09 MED ORDER — PANTOPRAZOLE SODIUM 40 MG PO TBEC: 40.0000 mg | DELAYED_RELEASE_TABLET | Freq: Two times a day (BID) | ORAL | 0 refills | Status: DC

## 2018-08-09 NOTE — Telephone Encounter (Signed)
New Rx has been faxed to pharmacy.

## 2018-08-09 NOTE — Telephone Encounter (Signed)
Patient daughter call said that they never received the medication for pantoprazole (PROTONIX) 40 MG sent to [facility]

## 2018-09-14 ENCOUNTER — Telehealth: Payer: Self-pay | Admitting: Internal Medicine

## 2018-09-14 ENCOUNTER — Telehealth: Payer: Self-pay

## 2018-09-14 ENCOUNTER — Other Ambulatory Visit: Payer: Self-pay

## 2018-09-14 ENCOUNTER — Other Ambulatory Visit: Payer: Medicare Other

## 2018-09-14 NOTE — Telephone Encounter (Signed)
Patient had lab appointment scheduled today but it was cancelled.  Patient's daughter Lindsey Salazar wants to know why labs were not done.  Daughter wants to know patient's hemoglobin levels.  Patient has appointment on 09/16/18 with Dr. Mariea Clonts.  This was changed to televisit per daughter's request.  Please call patient's daughter regarding labs.

## 2018-09-14 NOTE — Telephone Encounter (Signed)
Patient came into office today for labs. Linus Orn ([facility] lab tech) noticed there are no orders. Routing message to provider and medical assistant know.

## 2018-09-14 NOTE — Telephone Encounter (Signed)
Reviewed patient's last visit and nothing was documented stating patient needed any labs prior to appointment with provider on Thursday.

## 2018-09-14 NOTE — Telephone Encounter (Signed)
I am thinking they were wanting to get the lab tests done that GI ordered in our lab b/c it's easier to do.  Unfortunately, the lab is different so Linus Orn cannot see his orders and I would have had to put them in myself, it appears.    We'll have to draw her labs at her visit Thursday.

## 2018-09-14 NOTE — Telephone Encounter (Signed)
Lindsey Salazar was handling this this morning and I already sent her a response.  The labs were ordered by GI not me.  Apparently our lab is different so they could not be seen.  We need to put in the orders again for the patient to get them done before her televisit.

## 2018-09-14 NOTE — Telephone Encounter (Signed)
Patient was told to come back Thursday day of appointment. Patient began getting agitated waiting to see if she needed labs.

## 2018-09-15 ENCOUNTER — Other Ambulatory Visit: Payer: Self-pay

## 2018-09-15 DIAGNOSIS — K259 Gastric ulcer, unspecified as acute or chronic, without hemorrhage or perforation: Secondary | ICD-10-CM

## 2018-09-15 DIAGNOSIS — F0280 Dementia in other diseases classified elsewhere without behavioral disturbance: Secondary | ICD-10-CM

## 2018-09-15 DIAGNOSIS — D62 Acute posthemorrhagic anemia: Secondary | ICD-10-CM

## 2018-09-15 DIAGNOSIS — K2970 Gastritis, unspecified, without bleeding: Secondary | ICD-10-CM

## 2018-09-15 DIAGNOSIS — K279 Peptic ulcer, site unspecified, unspecified as acute or chronic, without hemorrhage or perforation: Secondary | ICD-10-CM

## 2018-09-15 DIAGNOSIS — R748 Abnormal levels of other serum enzymes: Secondary | ICD-10-CM

## 2018-09-15 DIAGNOSIS — K56609 Unspecified intestinal obstruction, unspecified as to partial versus complete obstruction: Secondary | ICD-10-CM

## 2018-09-15 NOTE — Telephone Encounter (Signed)
Thank you :)

## 2018-09-15 NOTE — Telephone Encounter (Signed)
Called and spoke with [name] patient's daughter. Explained to her there was no lab orders in. Only orders showing were from another provider at a different practice and our lab tech was unable to release those orders.  We needed confirmation from Dr. Mariea Clonts before drawing labs. Patient had became agitated while waiting and went ahead sent patient home. Informed her that orders have been placed for patient to come back and have labs drawn. She will just need to call us and schedule the lab appointment. Daughter stated she would need to discuss transportation and schedules with family before making appointment and would give [place] a call back. She denied further questions.

## 2018-09-16 ENCOUNTER — Other Ambulatory Visit: Payer: Self-pay

## 2018-09-16 ENCOUNTER — Encounter: Payer: Medicare Other | Admitting: Internal Medicine

## 2018-09-17 ENCOUNTER — Other Ambulatory Visit: Payer: Self-pay | Admitting: Gastroenterology

## 2018-09-17 IMAGING — DX DG MANDIBLE 4+V
4 series · 4 of 4 positions shown · non-contrast
Comparison: Head CT dated 02/05/2016.

CLINICAL DATA: Mandibular pain following a fall.

EXAM:
MANDIBLE - 4+ VIEW

[mandible pa]
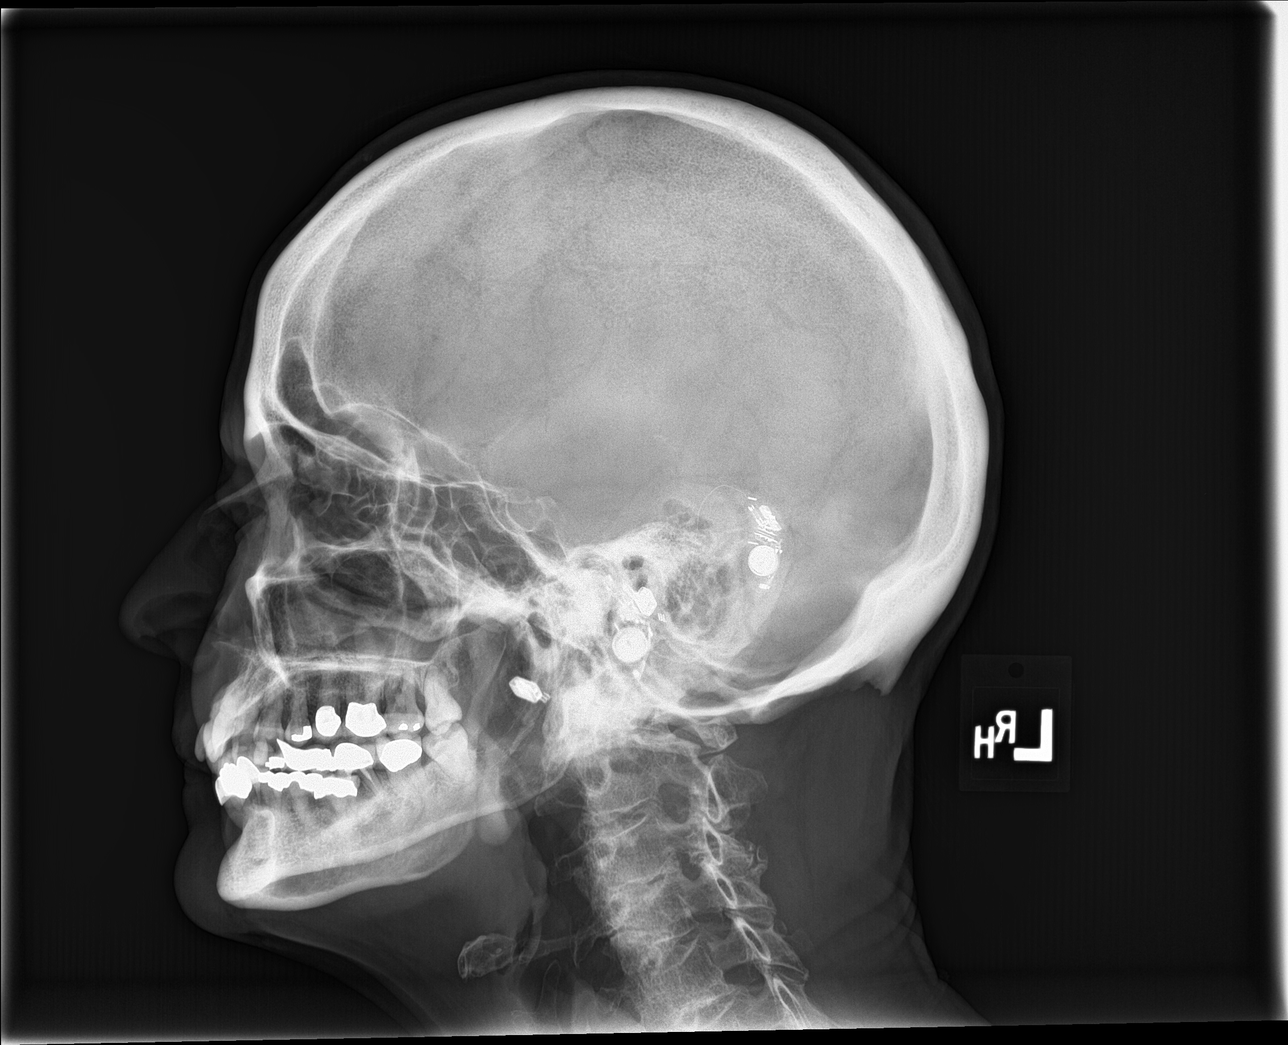

[mandible obl (1 of 2)]
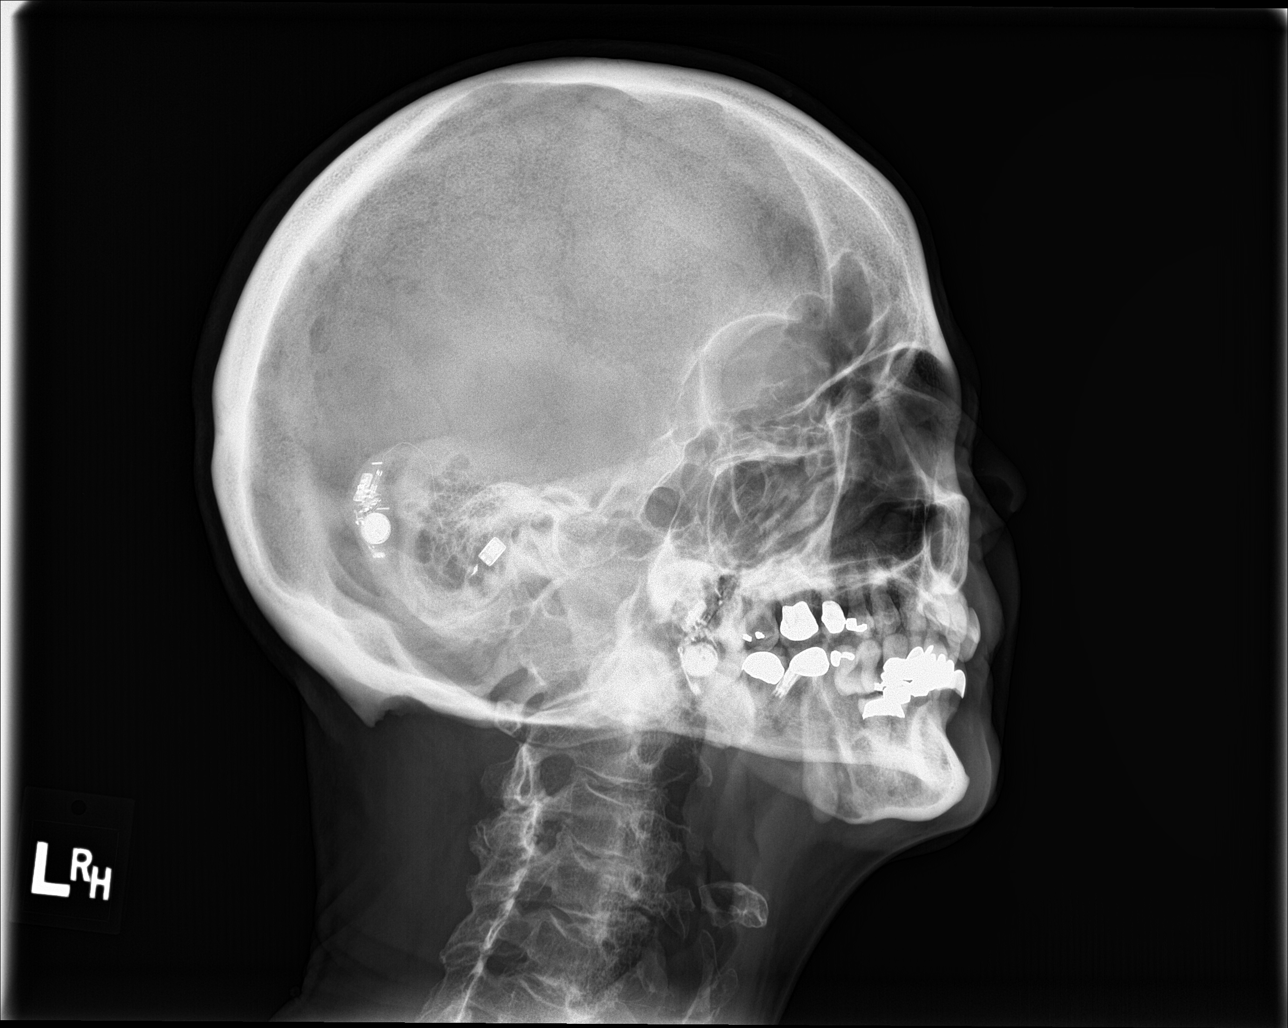

[mandible obl (2 of 2)]
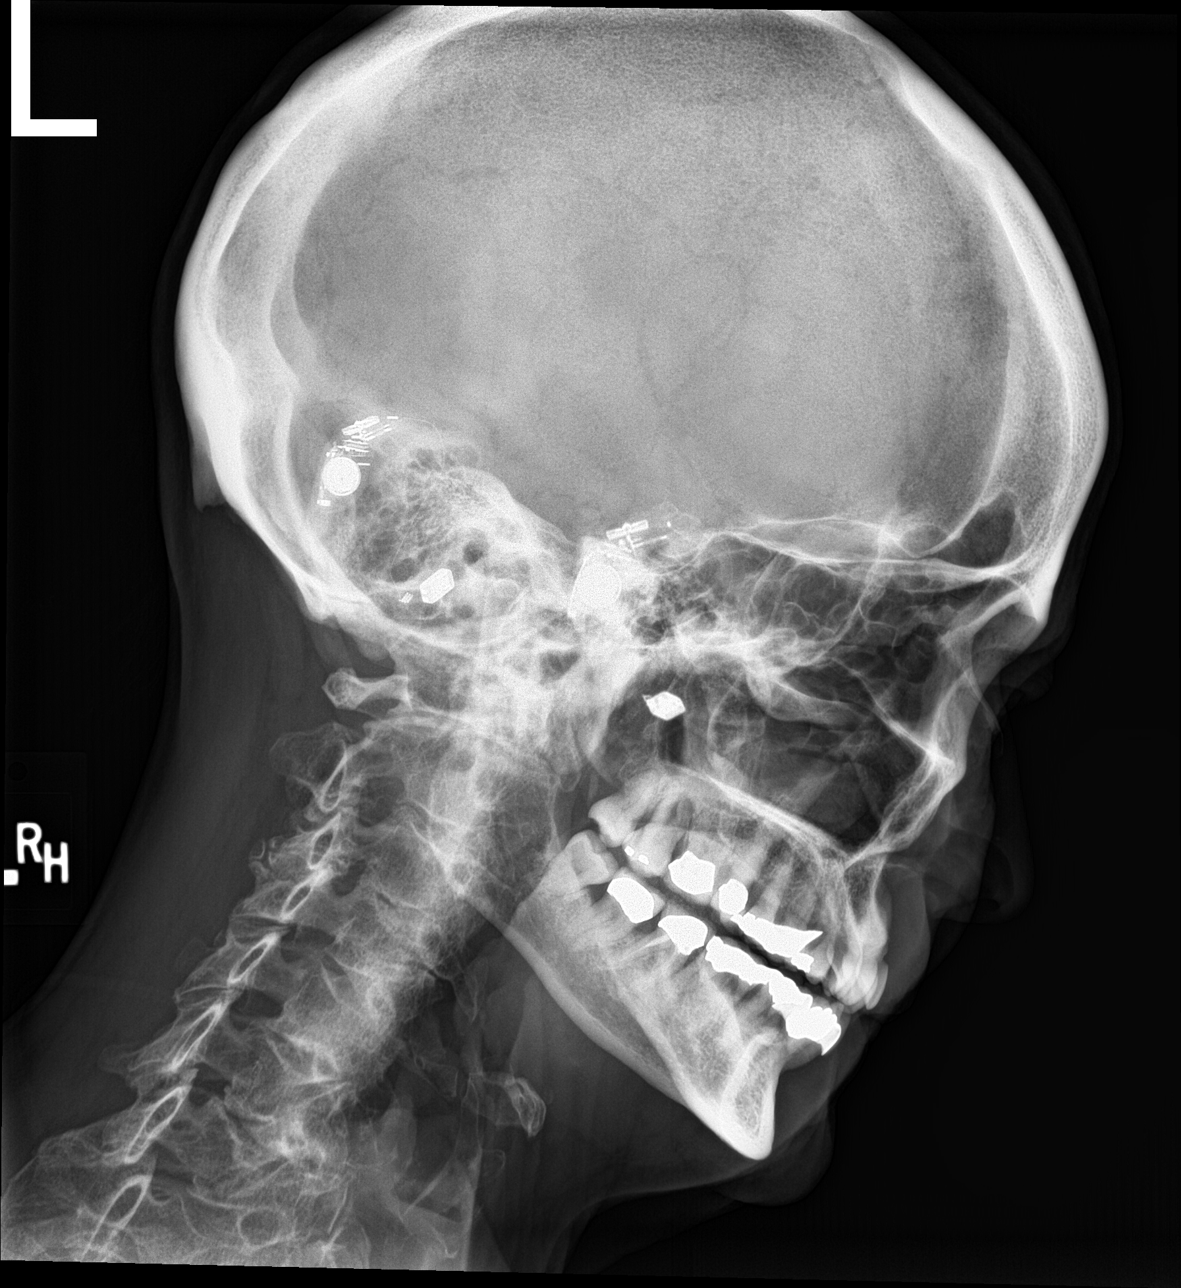

[mandible lat]
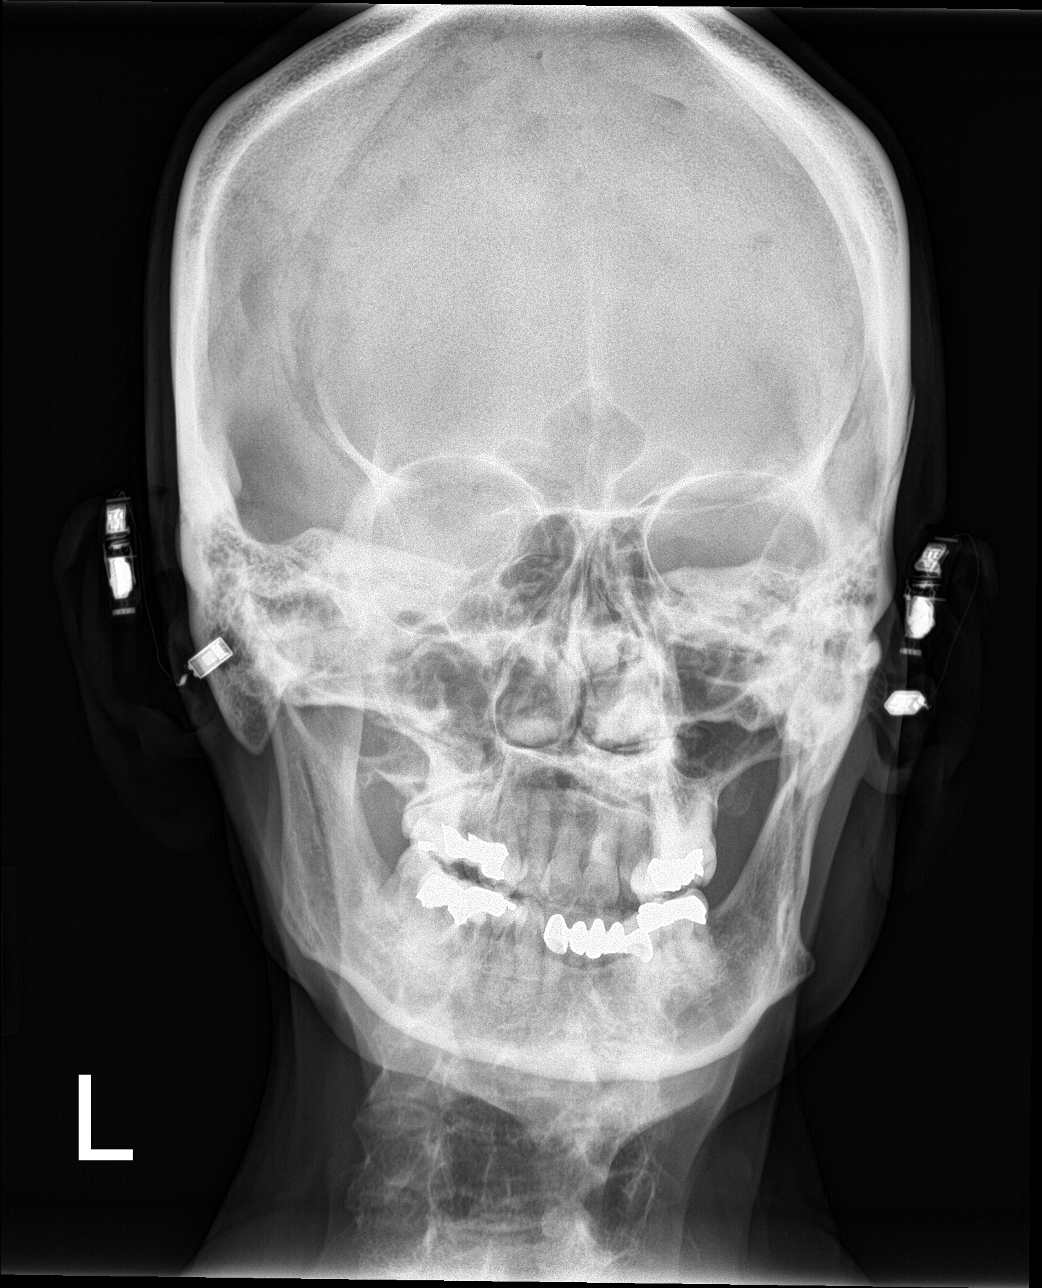

[4 of 4 positions shown; findings below may reference images not displayed]

FINDINGS: The visualized portion of the mandible has a normal appearance with
no fracture or dislocation seen. Mild upper cervical spine
degenerative changes are noted.
IMPRESSION: Limited visualization of the mandible with no fracture or
dislocation seen. If there is a clinical concern for fracture or
dislocation, a maxillofacial CT without contrast would be
recommended.

## 2018-09-17 NOTE — Progress Notes (Signed)
This encounter was created in error - please disregard.

## 2018-09-20 ENCOUNTER — Ambulatory Visit: Payer: Self-pay | Admitting: Internal Medicine

## 2018-09-20 ENCOUNTER — Other Ambulatory Visit: Payer: Self-pay | Admitting: Gastroenterology

## 2018-09-20 NOTE — Telephone Encounter (Signed)
[facility] called requesting rf for pantoprazole, they stated that they sent request last Friday. Anything that you can do to speed up this process would be appreciate it, they stated that pt's daughter keeps calling them because pt is out of med.

## 2018-09-21 MED ORDER — PANTOPRAZOLE SODIUM 40 MG PO TBEC: 40.0000 mg | DELAYED_RELEASE_TABLET | Freq: Every day | ORAL | 3 refills | Status: DC

## 2018-09-21 NOTE — Telephone Encounter (Signed)
I have sent prescription to patients pharmacy and called and left message for patient to return my call.

## 2018-11-08 ENCOUNTER — Telehealth: Payer: Self-pay | Admitting: *Deleted

## 2018-11-08 NOTE — Telephone Encounter (Signed)
[name], daughter notified. Daughter stated that she knows this is coming from husband's daughter and doesn't want her involved in anything regarding patient. [name] stated that they are working on Hospital doctor into home but it is a process and the Husband's daughters are impatient.  [name] stated that they are not allowed any information regarding patient.

## 2018-11-08 NOTE — Telephone Encounter (Signed)
If she needs in-home care--as in a sitter or caregiver, this is not covered by medicare and does not require a referral.  I recommend getting in touch with [facility], [facility], etc.  [name]'s daughter has been orchestrating all of the assistance she's been getting.  Please contact Lucciana's daughter to arrange.

## 2018-11-08 NOTE — Telephone Encounter (Signed)
Patient's husband was in this morning with his daughter, Carlynn Purl,  to see Janett Billow and stated that his wife needed in home care due to anxiety. Stated that he cannot do anything without the wife being right there with him. Stated that she could benefit from in home care. Janett Billow had them write up message for Dr. Mariea Clonts to address. Please Advise.

## 2018-11-08 NOTE — Telephone Encounter (Signed)
[facility] [phone]  Tried calling daughter, [name]. LMOM to [facility]

## 2018-11-19 ENCOUNTER — Other Ambulatory Visit: Payer: Self-pay | Admitting: Internal Medicine

## 2018-11-23 ENCOUNTER — Telehealth: Payer: Self-pay

## 2018-11-23 NOTE — Telephone Encounter (Signed)
Patients daughter called she wanted to see if she could get an appointment for this Friday with Dr. Mariea Clonts to discuss her mother's Dementia and what is the best way to care for her she feels as though she could use Dr. Magdalene Molly advice on this subject I informed her Dr. Mariea Clonts is only in the office on Monday's and Thursday's she aid she would call back to try and set up something later as she has to work that day this week

## 2018-12-01 ENCOUNTER — Other Ambulatory Visit: Payer: Self-pay | Admitting: Internal Medicine

## 2018-12-02 ENCOUNTER — Other Ambulatory Visit: Payer: Self-pay | Admitting: Gastroenterology

## 2018-12-09 ENCOUNTER — Other Ambulatory Visit: Payer: Self-pay

## 2018-12-09 ENCOUNTER — Encounter: Payer: Self-pay | Admitting: Gastroenterology

## 2018-12-09 ENCOUNTER — Ambulatory Visit (INDEPENDENT_AMBULATORY_CARE_PROVIDER_SITE_OTHER): Payer: Medicare Other

## 2018-12-09 ENCOUNTER — Other Ambulatory Visit (INDEPENDENT_AMBULATORY_CARE_PROVIDER_SITE_OTHER): Payer: Medicare Other

## 2018-12-09 ENCOUNTER — Ambulatory Visit: Payer: Medicare Other | Admitting: Gastroenterology

## 2018-12-09 VITALS — BP 130/70 | HR 53 | Temp 96.3°F | Ht 64.0 in | Wt 107.5 lb

## 2018-12-09 DIAGNOSIS — Z23 Encounter for immunization: Secondary | ICD-10-CM | POA: Diagnosis not present

## 2018-12-09 DIAGNOSIS — K269 Duodenal ulcer, unspecified as acute or chronic, without hemorrhage or perforation: Secondary | ICD-10-CM

## 2018-12-09 DIAGNOSIS — D62 Acute posthemorrhagic anemia: Secondary | ICD-10-CM | POA: Diagnosis not present

## 2018-12-09 DIAGNOSIS — K2970 Gastritis, unspecified, without bleeding: Secondary | ICD-10-CM

## 2018-12-09 LAB — CBC WITH DIFFERENTIAL/PLATELET
Basophils Absolute: 0 10*3/uL (ref 0.0–0.1)
Basophils Relative: 0.9 % (ref 0.0–3.0)
Eosinophils Absolute: 0.1 10*3/uL (ref 0.0–0.7)
Eosinophils Relative: 1.1 % (ref 0.0–5.0)
HCT: 39.5 % (ref 36.0–46.0)
Hemoglobin: 12.8 g/dL (ref 12.0–15.0)
Lymphocytes Relative: 19.8 % (ref 12.0–46.0)
Lymphs Abs: 1 10*3/uL (ref 0.7–4.0)
MCHC: 32.5 g/dL (ref 30.0–36.0)
MCV: 97.4 fl (ref 78.0–100.0)
Monocytes Absolute: 0.5 10*3/uL (ref 0.1–1.0)
Monocytes Relative: 9.1 % (ref 3.0–12.0)
Neutro Abs: 3.5 10*3/uL (ref 1.4–7.7)
Neutrophils Relative %: 69.1 % (ref 43.0–77.0)
Platelets: 133 10*3/uL — ABNORMAL LOW (ref 150.0–400.0)
RBC: 4.05 Mil/uL (ref 3.87–5.11)
RDW: 15.7 % — ABNORMAL HIGH (ref 11.5–15.5)
WBC: 5 10*3/uL (ref 4.0–10.5)

## 2018-12-09 LAB — IBC + FERRITIN
Ferritin: 26.5 ng/mL (ref 10.0–291.0)
Iron: 76 ug/dL (ref 42–145)
Saturation Ratios: 17.6 % — ABNORMAL LOW (ref 20.0–50.0)
Transferrin: 308 mg/dL (ref 212.0–360.0)

## 2018-12-09 NOTE — Patient Instructions (Addendum)
If you are age 83 or older, your body mass index should be between 23-30. Your Body mass index is 18.45 kg/m. If this is out of the aforementioned range listed, please consider follow up with your Primary Care Provider.  If you are age 11 or younger, your body mass index should be between 19-25. Your Body mass index is 18.45 kg/m. If this is out of the aformentioned range listed, please consider follow up with your Primary Care Provider.   Your provider has requested that you go to the basement level for lab work before leaving today. Press "B" on the elevator. The lab is located at the first door on the left as you exit the elevator.  Follow up as needed.   It was a pleasure to see you today!

## 2018-12-09 NOTE — Progress Notes (Signed)
P  Chief Complaint:    Duodenal ulcer  GI History: Lindsey Salazar is a 85 y.o. female with a history of Alzheimer's disease with dementia, CAD, hypertension, CKD 4, thrombocytopenia,  with admission in 05/2018 for symptomatic anemia 2/2 to large antral ulcers.  Biopsies demonstrated inflammatory changes/reactive gastropathy, negative for malignancy or H. pylori.  Treated with high-dose PPI, with repeat EGD in 06/2018 demonstrating 1 healing ulcer, and the second ulcer completely resolved.  Repeat biopsies confirmed no malignant pathology.  Was treated with 4 more weeks of high-dose PPI, then reduce to Protonix 40 mg/day and now planning to reduce to Protonix 20 mg/day for ongoing gastric prophylaxis  Endoscopic history: -EGD (05/30/2018, Dr. Bryan Lemma): Large, edematous, erythematous, ulcerated masslike lesion in the gastric antrum and prepyloric stomach with 2 distinct, large, cratered ulcers.  Several biopsies obtained and notable for inflammatory changes without malignancy or dysplasia.  HPI:     Patient is a 85 y.o. female presenting to the Gastroenterology Clinic for follow-up.  Daughter, Lindsey Salazar, supplies history today due to dementia. Today she states she is doing well. Good PO intake, weight stable. No abdominal pain, melena, lightheadness.  Taking all medications as prescribed.  No new labs from her last appointment in 06/2018 (had ordered for repeat CBC and iron panel in 08/2018, but never completed).  No new imaging.  Not taking any NSAIDs or ASA.   Baseline hemoglobin ~15 prior to admission in 05/2018.    Review of systems:     No chest pain, no SOB, no fevers, no urinary sx   Past Medical History:  Diagnosis Date  . Acute bronchitis   . Alzheimer disease ([place])   . Anemia   . Benign neoplasm of uterus, part unspecified   . Chronic ischemic heart disease, unspecified   . Chronic kidney disease, stage II (mild)   . Cough   . Encounter for long-term (current)  use of other medications   . Fall 01/30/2016  . Ganglion, unspecified   . Hearing loss   . Heart murmur   . Hyperlipidemia   . Hypertension   . Impacted cerumen   . Light-headed feeling 09/25/2015  . Macular degeneration (senile) of retina, unspecified   . Mitral valve disorders(424.0)   . Nonspecific abnormal electrocardiogram (ECG) (EKG)   . Osteoporosis, unspecified   . Other abnormal blood chemistry   . Other emphysema ([place])   . Other malaise and fatigue   . Palpitations   . Senile cataract, unspecified   . Senile purpura ([place]) 06/06/2015  . Thrombocytopenia, unspecified ([place])   . Unspecified hearing loss     Patient's surgical history, family medical history, social history, medications and allergies were all reviewed in Epic    Current Outpatient Medications  Medication Sig Dispense Refill  . atorvastatin (LIPITOR) 10 MG tablet TAKE 1 TABLET ONCE DAILY TO LOWER CHOLESTEROL. 90 tablet 1  . BYSTOLIC 5 MG tablet TAKE 1 TABLET ONCE DAILY. 90 tablet 1  . Cholecalciferol (VITAMIN D3) 50 MCG (2000 UT) capsule Take 1 capsule (2,000 Units total) by mouth daily. 30 capsule 5  . donepezil (ARICEPT) 10 MG tablet TAKE ONE TABLET AT BEDTIME. 90 tablet 1  . Ensure (ENSURE) Take 1 Can by mouth daily.    . memantine (NAMENDA XR) 28 MG CP24 24 hr capsule TAKE 1 CAPSULE DAILY. 90 capsule 0  . pantoprazole (PROTONIX) 40 MG tablet Take 1 tablet (40 mg total) by mouth daily. 30 tablet 3  . Prenatal  Vit-Fe Fumarate-FA (PRENATAL PO) Take 1 tablet by mouth daily.    Marland Kitchen Specialty Vitamins Products (LIPOTRIAD VISION SUPPORT PO) Take 1 tablet by mouth daily.     No current facility-administered medications for this visit.     Physical Exam:     BP 130/70   Pulse (!) 53   Temp (!) 96.3 F (35.7 C)   Ht 5\' 4"  (1.626 m)   Wt 107 lb 8 oz (48.8 kg)   BMI 18.45 kg/m   GENERAL:  Pleasant female in NAD PSYCH: : Cooperative, normal affect EENT:  conjunctiva pink, mucous membranes moist, neck  supple without masses CARDIAC:  SEM, RRR, no peripheral edema PULM: Normal respiratory effort, lungs CTA bilaterally, no wheezing ABDOMEN:  Nondistended, soft, nontender. No obvious masses, no hepatomegaly,  normal bowel sounds SKIN:  turgor, no lesions seen Musculoskeletal:  Normal muscle tone, normal strength NEURO: Alert and oriented x 3, no focal neurologic deficits   IMPRESSION and PLAN:    1) PUD 2) Gastritis 3) Acute blood loss anemia -Completed prolonged course of high-dose acid suppression therapy.  No recurrence of overt GI blood loss, and otherwise feeling well. -Repeat CBC and iron panel/ferritin today.  If stable/normalized, can plan to repeat in 6 to 12 months -Initial follow-up was for continued Protonix 20 mg/day for ongoing gastric prophylaxis.  She does not take any NSAIDs.  The daughter expressed some concerns and hoping to stop the Protonix as a means to limit her medication list, limit medication ADRs (most specifically iron deficiency and renal dysfunction).  Her prior GI bleed was otherwise essentially unprovoked, so am a little hesitant about withdrawing PPI completely, but certainly worth the discussion today.    4) Alzheimer's dementia: -Daughter accompanies her and assists with history and medical decision-making          Lavena Bullion ,DO, FACG 12/09/2018, 9:04 AM

## 2018-12-17 ENCOUNTER — Ambulatory Visit: Payer: Self-pay

## 2018-12-20 ENCOUNTER — Ambulatory Visit: Payer: Self-pay

## 2018-12-20 ENCOUNTER — Encounter: Payer: Self-pay | Admitting: Family

## 2018-12-21 ENCOUNTER — Encounter: Payer: Self-pay | Admitting: Family

## 2018-12-21 ENCOUNTER — Ambulatory Visit (INDEPENDENT_AMBULATORY_CARE_PROVIDER_SITE_OTHER): Payer: Medicare Other | Admitting: Family

## 2018-12-21 ENCOUNTER — Other Ambulatory Visit: Payer: Self-pay

## 2018-12-21 VITALS — BP 120/78 | HR 60 | Temp 97.7°F | Ht 64.0 in | Wt 107.6 lb

## 2018-12-21 DIAGNOSIS — Z0000 Encounter for general adult medical examination without abnormal findings: Secondary | ICD-10-CM | POA: Diagnosis not present

## 2018-12-21 NOTE — Patient Instructions (Signed)
[name] , Thank you for taking time to come for your Medicare Wellness Visit. I appreciate your ongoing commitment to your health goals. Please review the following plan we discussed and let me know if I can assist you in the future.   Screening recommendations/referrals: Colonoscopy: Aged out  Mammogram: Up to date  Bone Density  Up to date  Recommended yearly ophthalmology/optometry visit for glaucoma screening and checkup Recommended yearly dental visit for hygiene and checkup  Vaccinations: Influenza vaccine : Up to date  Pneumococcal vaccine : Up to date  Tdap vaccine  Up to date  Shingles vaccine  Up to date   Advanced directives: Yes   Conditions/risks identified: Advance Age female > 42 yrs,Dyslipidemia,Hypeternsion   Next appointment: 1 year    Preventive Care 83 Years and Older, Female Preventive care refers to lifestyle choices and visits with your health care provider that can promote health and wellness. What does preventive care include?  A yearly physical exam. This is also called an annual well check.  Dental exams once or twice a year.  Routine eye exams. Ask your health care provider how often you should have your eyes checked.  Personal lifestyle choices, including:  Daily care of your teeth and gums.  Regular physical activity.  Eating a healthy diet.  Avoiding tobacco and drug use.  Limiting alcohol use.  Practicing safe sex.  Taking low-dose aspirin every day.  Taking vitamin and mineral supplements as recommended by your health care provider. What happens during an annual well check? The services and screenings done by your health care provider during your annual well check will depend on your age, overall health, lifestyle risk factors, and family history of disease. Counseling  Your health care provider may ask you questions about your:  Alcohol use.  Tobacco use.  Drug use.  Emotional well-being.  Home and relationship  well-being.  Sexual activity.  Eating habits.  History of falls.  Memory and ability to understand (cognition).  Work and work Statistician.  Reproductive health. Screening  You may have the following tests or measurements:  Height, weight, and BMI.  Blood pressure.  Lipid and cholesterol levels. These may be checked every 5 years, or more frequently if you are over 38 years old.  Skin check.  Lung cancer screening. You may have this screening every year starting at age 37 if you have a 30-pack-year history of smoking and currently smoke or have quit within the past 15 years.  Fecal occult blood test (FOBT) of the stool. You may have this test every year starting at age 32.  Flexible sigmoidoscopy or colonoscopy. You may have a sigmoidoscopy every 5 years or a colonoscopy every 10 years starting at age 13.  Hepatitis C blood test.  Hepatitis B blood test.  Sexually transmitted disease (STD) testing.  Diabetes screening. This is done by checking your blood sugar (glucose) after you have not eaten for a while (fasting). You may have this done every 1-3 years.  Bone density scan. This is done to screen for osteoporosis. You may have this done starting at age 65.  Mammogram. This may be done every 1-2 years. Talk to your health care provider about how often you should have regular mammograms. Talk with your health care provider about your test results, treatment options, and if necessary, the need for more tests. Vaccines  Your health care provider may recommend certain vaccines, such as:  Influenza vaccine. This is recommended every year.  Tetanus, diphtheria,  and acellular pertussis (Tdap, Td) vaccine. You may need a Td booster every 10 years.  Zoster vaccine. You may need this after age 77.  Pneumococcal 13-valent conjugate (PCV13) vaccine. One dose is recommended after age 13.  Pneumococcal polysaccharide (PPSV23) vaccine. One dose is recommended after age 58.  Talk to your health care provider about which screenings and vaccines you need and how often you need them. This information is not intended to replace advice given to you by your health care provider. Make sure you discuss any questions you have with your health care provider. Document Released: 02/02/2015 Document Revised: 09/26/2015 Document Reviewed: 11/07/2014 Elsevier Interactive Patient Education  2017 [place] Prevention in the Home Falls can cause injuries. They can happen to people of all ages. There are many things you can do to make your home safe and to help prevent falls. What can I do on the outside of my home?  Regularly fix the edges of walkways and driveways and fix any cracks.  Remove anything that might make you trip as you walk through a door, such as a raised step or threshold.  Trim any bushes or trees on the path to your home.  Use bright outdoor lighting.  Clear any walking paths of anything that might make someone trip, such as rocks or tools.  Regularly check to see if handrails are loose or broken. Make sure that both sides of any steps have handrails.  Any raised decks and porches should have guardrails on the edges.  Have any leaves, snow, or ice cleared regularly.  Use sand or salt on walking paths during winter.  Clean up any spills in your garage right away. This includes oil or grease spills. What can I do in the bathroom?  Use night lights.  Install grab bars by the toilet and in the tub and shower. Do not use towel bars as grab bars.  Use non-skid mats or decals in the tub or shower.  If you need to sit down in the shower, use a plastic, non-slip stool.  Keep the floor dry. Clean up any water that spills on the floor as soon as it happens.  Remove soap buildup in the tub or shower regularly.  Attach bath mats securely with double-sided non-slip rug tape.  Do not have throw rugs and other things on the floor that can make you  trip. What can I do in the bedroom?  Use night lights.  Make sure that you have a light by your bed that is easy to reach.  Do not use any sheets or blankets that are too big for your bed. They should not hang down onto the floor.  Have a firm chair that has side arms. You can use this for support while you get dressed.  Do not have throw rugs and other things on the floor that can make you trip. What can I do in the kitchen?  Clean up any spills right away.  Avoid walking on wet floors.  Keep items that you use a lot in easy-to-reach places.  If you need to reach something above you, use a strong step stool that has a grab bar.  Keep electrical cords out of the way.  Do not use floor polish or wax that makes floors slippery. If you must use wax, use non-skid floor wax.  Do not have throw rugs and other things on the floor that can make you trip. What can I do with my  stairs?  Do not leave any items on the stairs.  Make sure that there are handrails on both sides of the stairs and use them. Fix handrails that are broken or loose. Make sure that handrails are as long as the stairways.  Check any carpeting to make sure that it is firmly attached to the stairs. Fix any carpet that is loose or worn.  Avoid having throw rugs at the top or bottom of the stairs. If you do have throw rugs, attach them to the floor with carpet tape.  Make sure that you have a light switch at the top of the stairs and the bottom of the stairs. If you do not have them, ask someone to add them for you. What else can I do to help prevent falls?  Wear shoes that:  Do not have high heels.  Have rubber bottoms.  Are comfortable and fit you well.  Are closed at the toe. Do not wear sandals.  If you use a stepladder:  Make sure that it is fully opened. Do not climb a closed stepladder.  Make sure that both sides of the stepladder are locked into place.  Ask someone to hold it for you, if  possible.  Clearly mark and make sure that you can see:  Any grab bars or handrails.  First and last steps.  Where the edge of each step is.  Use tools that help you move around (mobility aids) if they are needed. These include:  Canes.  Walkers.  Scooters.  Crutches.  Turn on the lights when you go into a dark area. Replace any light bulbs as soon as they burn out.  Set up your furniture so you have a clear path. Avoid moving your furniture around.  If any of your floors are uneven, fix them.  If there are any pets around you, be aware of where they are.  Review your medicines with your doctor. Some medicines can make you feel dizzy. This can increase your chance of falling. Ask your doctor what other things that you can do to help prevent falls. This information is not intended to replace advice given to you by your health care provider. Make sure you discuss any questions you have with your health care provider. Document Released: 11/02/2008 Document Revised: 06/14/2015 Document Reviewed: 02/10/2014 Elsevier Interactive Patient Education  2017 [employer].

## 2018-12-21 NOTE — Progress Notes (Signed)
Subjective:   Gwynne Kemnitz is a 85 y.o. female who presents for Medicare Annual (Subsequent) preventive examination.  Review of Systems:  Cardiac Risk Factors include: advanced age (>51men, >23 women);dyslipidemia;hypertension     Objective:     Vitals: BP 120/78   Pulse 60   Temp 97.7 F (36.5 C) (Oral)   Ht 5\' 4"  (1.626 m)   Wt 107 lb 9.6 oz (48.8 kg)   SpO2 96%   BMI 18.47 kg/m   Body mass index is 18.47 kg/m.  Advanced Directives 12/21/2018 12/21/2018 06/03/2018 05/29/2018 05/25/2018 12/14/2017 11/04/2017  Does Patient Have a Medical Advance Directive? - Yes Yes Yes Yes Yes Yes  Type of Advance Directive Living will;Healthcare Power of [place];Living will [place];Living will [place];Living will [place];Living will [place];Living will [place];Living will  Does patient want to make changes to medical advance directive? - No - Patient declined No - Patient declined No - Patient declined No - Patient declined No - Patient declined -  Copy of [place] in Chart? Yes - validated most recent copy scanned in chart (See row information) Yes - validated most recent copy scanned in chart (See row information) Yes - validated most recent copy scanned in chart (See row information) Yes - validated most recent copy scanned in chart (See row information) Yes - validated most recent copy scanned in chart (See row information) Yes - validated most recent copy scanned in chart (See row information) Yes    Tobacco Social History   Tobacco Use  Smoking Status Never Smoker  Smokeless Tobacco Never Used     Counseling given: Not Answered   Clinical Intake:  Pre-visit preparation completed: No  Pain : No/denies pain     BMI - recorded: 18.47 Nutritional Status: BMI <19  Underweight Nutritional Risks: None Diabetes: No  How  often do you need to have someone help you when you read instructions, pamphlets, or other written materials from your doctor or pharmacy?: 1 - Never What is the last grade level you completed in school?: 1 year of college  Interpreter Needed?: No  Information entered by :: Dinah Ngetich FNP-C  Past Medical History:  Diagnosis Date  . Acute bronchitis   . Alzheimer disease ([place])   . Anemia   . Benign neoplasm of uterus, part unspecified   . Chronic ischemic heart disease, unspecified   . Chronic kidney disease, stage II (mild)   . Cough   . Encounter for long-term (current) use of other medications   . Fall 01/30/2016  . Ganglion, unspecified   . Hearing loss   . Heart murmur   . Hyperlipidemia   . Hypertension   . Impacted cerumen   . Light-headed feeling 09/25/2015  . Macular degeneration (senile) of retina, unspecified   . Mitral valve disorders(424.0)   . Nonspecific abnormal electrocardiogram (ECG) (EKG)   . Osteoporosis, unspecified   . Other abnormal blood chemistry   . Other emphysema ([place])   . Other malaise and fatigue   . Palpitations   . Senile cataract, unspecified   . Senile purpura ([place]) 06/06/2015  . Thrombocytopenia, unspecified ([place])   . Unspecified hearing loss    Past Surgical History:  Procedure Laterality Date  . BIOPSY  05/30/2018   Procedure: BIOPSY;  Surgeon: Lavena Bullion, DO;  Location[facility];  Service: Gastroenterology;;  . ESOPHAGOGASTRODUODENOSCOPY (EGD) WITH PROPOFOL  N/A 05/30/2018   Procedure: ESOPHAGOGASTRODUODENOSCOPY (EGD) WITH PROPOFOL;  Surgeon: Lavena Bullion, DO;  Location: [place];  Service: Gastroenterology;  Laterality: N/A;  . KNEE ARTHROSCOPY     right   Family History  Problem Relation Age of Onset  . Stroke Brother   . Cancer Sister        lung  . Parkinsonism Father        Alzheimer's  . Colon cancer Neg Hx   . Esophageal cancer Neg Hx   . Rectal cancer Neg Hx   . Stomach cancer Neg Hx    Social  History   Socioeconomic History  . Marital status: Single    Spouse name: Not on file  . Number of children: 4  . Years of education: Not on file  . Highest education level: Not on file  Occupational History    Employer: RETIRED  Social Needs  . Financial resource strain: Not hard at all  . Food insecurity    Worry: Never true    Inability: Never true  . Transportation needs    Medical: No    Non-medical: No  Tobacco Use  . Smoking status: Never Smoker  . Smokeless tobacco: Never Used  Substance and Sexual Activity  . Alcohol use: No  . Drug use: No  . Sexual activity: Not Currently  Lifestyle  . Physical activity    Days per week: 3 days    Minutes per session: 10 min  . Stress: Not at all  Relationships  . Social Herbalist on phone: Three times a week    Gets together: Once a week    Attends religious service: More than 4 times per year    Active member of club or organization: No    Attends meetings of clubs or organizations: Never    Relationship status: Married  Other Topics Concern  . Not on file  Social History Narrative   Lives with husband.      Outpatient Encounter Medications as of 12/21/2018  Medication Sig  . atorvastatin (LIPITOR) 10 MG tablet TAKE 1 TABLET ONCE DAILY TO LOWER CHOLESTEROL.  . BYSTOLIC 5 MG tablet TAKE 1 TABLET ONCE DAILY.  Marland Kitchen Cholecalciferol (VITAMIN D3) 50 MCG (2000 UT) capsule Take 1 capsule (2,000 Units total) by mouth daily.  Marland Kitchen donepezil (ARICEPT) 10 MG tablet TAKE ONE TABLET AT BEDTIME.  Marland Kitchen Ensure (ENSURE) Take 1 Can by mouth daily.  . memantine (NAMENDA XR) 28 MG CP24 24 hr capsule TAKE 1 CAPSULE DAILY.  Marland Kitchen Prenatal Vit-Fe Fumarate-FA (PRENATAL PO) Take 1 tablet by mouth daily.  Marland Kitchen Specialty Vitamins Products (LIPOTRIAD VISION SUPPORT PO) Take 1 tablet by mouth daily.  . [DISCONTINUED] pantoprazole (PROTONIX) 40 MG tablet Take 1 tablet (40 mg total) by mouth daily.   No facility-administered encounter medications on  file as of 12/21/2018.     Activities of Daily Living In your present state of health, do you have any difficulty performing the following activities: 12/21/2018  Hearing? Y  Comment wearing hearing aids  Vision? N  Difficulty concentrating or making decisions? Y  Comment Remembering and concentrating  Walking or climbing stairs? N  Dressing or bathing? N  Doing errands, shopping? Y  Comment family assist with transportation  Preparing Food and eating ? Y  Comment Husband assist  Using the Toilet? N  In the past six months, have you accidently leaked urine? N  Do you have problems with loss of bowel control? N  Managing your Medications? Y  Comment family assist  Managing your Finances? Y  Comment Has HCPOA son  Housekeeping or managing your Housekeeping? Y  Comment Husband assist  Some recent data might be hidden    Patient Care Team: Gayland Curry, DO as PCP - General (Geriatric Medicine) Minus Breeding, MD as Consulting Physician (Cardiology) Stark Klein, MD as Consulting Physician (General Surgery) Netta Cedars, MD as Consulting Physician (Orthopedic Surgery) Richmond Campbell, MD as Consulting Physician (Gastroenterology) Nat Christen, MD as Attending Physician (Optometry) Hortencia Pilar, MD as Consulting Physician (Surgery) Madelon Lips, MD as Consulting Physician (Nephrology)    Assessment:   This is a routine wellness examination for [place].  Exercise Activities and Dietary recommendations Current Exercise Habits: Home exercise routine, Type of exercise: walking, Time (Minutes): 10, Frequency (Times/Week): 3, Weekly Exercise (Minutes/Week): 30, Intensity: Mild, Exercise limited by: None identified  Goals    . Patient Stated     I would like to remain healthy        Fall Risk Fall Risk  12/21/2018 07/29/2018 06/03/2018 05/25/2018 03/22/2018  Falls in the past year? 0 0 1 1 0  Number falls in past yr: 0 0 0 0 0  Injury with Fall? - 0 0 0 0   Comment - - - - -  Risk Factor Category  - - - - -  Risk for fall due to : - - - - History of fall(s);Impaired balance/gait  Follow up - - - - Falls prevention discussed   Is the patient's home free of loose throw rugs in walkways, pet beds, electrical cords, etc?   yes      Grab bars in the bathroom? yes      Handrails on the stairs?   yes      Adequate lighting?   yes  Depression Screen PHQ 2/9 Scores 07/29/2018 06/03/2018 03/22/2018 12/14/2017  PHQ - 2 Score 0 0 0 0     Cognitive Function MMSE - Mini Mental State Exam 12/21/2018 12/21/2018 12/14/2017 12/11/2015 06/06/2015  Orientation to time 1 1 2 5 5   Orientation to Place 5 5 5 5 5   Registration 2 2 3 3 3   Attention/ Calculation 0 0 5 4 5   Recall 0 0 0 1 1  Language- name 2 objects 2 2 2 2 2   Language- repeat 1 1 1 1 1   Language- follow 3 step command 3 3 3 3 3   Language- read & follow direction 1 1 1 1 1   Write a sentence 1 1 1 1 1   Copy design 1 1 0 0 1  Total score 17 17 23 26 28         Immunization History  Administered Date(s) Administered  . Fluad Quad(high Dose 65+) 12/09/2018  . Influenza Split 09/18/2017  . Influenza, High Dose Seasonal PF 10/09/2016  . Influenza,inj,Quad PF,6+ Mos 11/23/2012, 11/30/2013, 12/06/2014  . Influenza-Unspecified 10/05/2009, 10/25/2010, 10/13/2011, 11/13/2015  . Pneumococcal Conjugate-13 07/01/2013, 02/09/2017  . Pneumococcal Polysaccharide-23 06/01/2006, 11/23/2012  . Pneumococcal-Unspecified 01/21/1995  . Td 08/20/1992, 06/01/2006, 06/23/2014  . Tdap 11/25/2012  . Zoster 02/11/2006, 11/14/2010    Qualifies for Shingles Vaccine? Up to date   Screening Tests Health Maintenance  Topic Date Due  . MAMMOGRAM  02/21/2019 (Originally 11/22/2017)  . TETANUS/TDAP  06/22/2024  . INFLUENZA VACCINE  Completed  . DEXA SCAN  Completed  . PNA vac Low Risk Adult  Completed    Cancer Screenings: Lung: Low Dose CT Chest recommended if  Age 75-80 years, 58 pack-year currently smoking OR  have quit w/in 15years. Patient does not qualify. Breast:  Up to date on Mammogram? Yes   Up to date of Bone Density/Dexa? Yes Colorectal: Aged out   Additional Screenings:: Hepatitis C Screening: Low Risk     Plan:   I have personally reviewed and noted the following in the patient's chart:   . Medical and social history . Use of alcohol, tobacco or illicit drugs  . Current medications and supplements . Functional ability and status . Nutritional status . Physical activity . Advanced directives . List of other physicians . Hospitalizations, surgeries, and ER visits in previous 12 months . Vitals . Screenings to include cognitive, depression, and falls . Referrals and appointments  In addition, I have reviewed and discussed with patient certain preventive protocols, quality metrics, and best practice recommendations. A written personalized care plan for preventive services as well as general preventive health recommendations were provided to patient.     Sandrea Hughs, NP  12/21/2018

## 2019-01-01 ENCOUNTER — Other Ambulatory Visit: Payer: Self-pay | Admitting: Internal Medicine

## 2019-01-16 ENCOUNTER — Other Ambulatory Visit: Payer: Self-pay | Admitting: Internal Medicine

## 2019-01-17 NOTE — Telephone Encounter (Signed)
rx sent to pharmacy by e-script  

## 2019-01-31 ENCOUNTER — Other Ambulatory Visit: Payer: Self-pay | Admitting: Internal Medicine

## 2019-02-12 ENCOUNTER — Ambulatory Visit: Payer: Medicare Other | Attending: Internal Medicine

## 2019-02-12 DIAGNOSIS — Z23 Encounter for immunization: Secondary | ICD-10-CM | POA: Insufficient documentation

## 2019-02-12 NOTE — Progress Notes (Signed)
   Covid-19 Vaccination Clinic  Name:  Lindsey Salazar    MRN: [id number] DOB: 26-Sep-1932  02/12/2019  [name] was observed post Covid-19 immunization for 15 minutes without incidence. She was provided with Vaccine Information Sheet and instruction to access the V-Safe system.   [name] was instructed to call 911 with any severe reactions post vaccine: Marland Kitchen Difficulty breathing  . Swelling of your face and throat  . A fast heartbeat  . A bad rash all over your body  . Dizziness and weakness    Immunizations Administered    Name Date Dose VIS Date Route   Pfizer COVID-19 Vaccine 02/12/2019 11:34 AM 0.3 mL 12/31/2018 Intramuscular   Manufacturer: [place]   Lot: [id number]   [place]: [id number]

## 2019-03-05 ENCOUNTER — Ambulatory Visit: Payer: Medicare Other | Attending: Internal Medicine

## 2019-03-05 DIAGNOSIS — Z23 Encounter for immunization: Secondary | ICD-10-CM | POA: Insufficient documentation

## 2019-03-05 NOTE — Progress Notes (Signed)
   Covid-19 Vaccination Clinic  Name:  Lilou Kneip    MRN: [id number] DOB: 07/31/1932  03/05/2019  Ms. Wildermuth was observed post Covid-19 immunization for 15 minutes without incidence. She was provided with Vaccine Information Sheet and instruction to access the V-Safe system.   Ms. Dehne was instructed to call 911 with any severe reactions post vaccine: Marland Kitchen Difficulty breathing  . Swelling of your face and throat  . A fast heartbeat  . A bad rash all over your body  . Dizziness and weakness    Immunizations Administered    Name Date Dose VIS Date Route   Pfizer COVID-19 Vaccine 03/05/2019  9:33 AM 0.3 mL 12/31/2018 Intramuscular   Manufacturer: [place]   Lot: [id number]   [place]: [id number]

## 2019-03-07 ENCOUNTER — Other Ambulatory Visit: Payer: Self-pay | Admitting: Gastroenterology

## 2019-03-07 DIAGNOSIS — D62 Acute posthemorrhagic anemia: Secondary | ICD-10-CM

## 2019-03-08 ENCOUNTER — Other Ambulatory Visit (INDEPENDENT_AMBULATORY_CARE_PROVIDER_SITE_OTHER): Payer: Medicare Other

## 2019-03-08 DIAGNOSIS — D62 Acute posthemorrhagic anemia: Secondary | ICD-10-CM | POA: Diagnosis not present

## 2019-03-08 LAB — IBC + FERRITIN
Ferritin: 33.1 ng/mL (ref 10.0–291.0)
Iron: 80 ug/dL (ref 42–145)
Saturation Ratios: 19.8 % — ABNORMAL LOW (ref 20.0–50.0)
Transferrin: 289 mg/dL (ref 212.0–360.0)

## 2019-03-08 LAB — CBC WITH DIFFERENTIAL/PLATELET
Basophils Absolute: 0.1 10*3/uL (ref 0.0–0.1)
Basophils Relative: 1 % (ref 0.0–3.0)
Eosinophils Absolute: 0.1 10*3/uL (ref 0.0–0.7)
Eosinophils Relative: 1.1 % (ref 0.0–5.0)
HCT: 41.7 % (ref 36.0–46.0)
Hemoglobin: 13.6 g/dL (ref 12.0–15.0)
Lymphocytes Relative: 22.4 % (ref 12.0–46.0)
Lymphs Abs: 1.2 10*3/uL (ref 0.7–4.0)
MCHC: 32.5 g/dL (ref 30.0–36.0)
MCV: 98.9 fl (ref 78.0–100.0)
Monocytes Absolute: 0.5 10*3/uL (ref 0.1–1.0)
Monocytes Relative: 9.6 % (ref 3.0–12.0)
Neutro Abs: 3.6 10*3/uL (ref 1.4–7.7)
Neutrophils Relative %: 65.9 % (ref 43.0–77.0)
Platelets: 120 10*3/uL — ABNORMAL LOW (ref 150.0–400.0)
RBC: 4.22 Mil/uL (ref 3.87–5.11)
RDW: 14.2 % (ref 11.5–15.5)
WBC: 5.5 10*3/uL (ref 4.0–10.5)

## 2019-03-13 ENCOUNTER — Other Ambulatory Visit: Payer: Self-pay | Admitting: Internal Medicine

## 2019-05-25 ENCOUNTER — Encounter: Payer: Self-pay | Admitting: Family

## 2019-05-25 ENCOUNTER — Ambulatory Visit (INDEPENDENT_AMBULATORY_CARE_PROVIDER_SITE_OTHER): Payer: Medicare Other | Admitting: Family

## 2019-05-25 ENCOUNTER — Other Ambulatory Visit: Payer: Self-pay

## 2019-05-25 VITALS — BP 146/80 | HR 56 | Temp 97.3°F | Resp 16 | Ht 64.0 in | Wt 112.8 lb

## 2019-05-25 DIAGNOSIS — R2681 Unsteadiness on feet: Secondary | ICD-10-CM | POA: Diagnosis not present

## 2019-05-25 DIAGNOSIS — R300 Dysuria: Secondary | ICD-10-CM | POA: Diagnosis not present

## 2019-05-25 DIAGNOSIS — W19XXXA Unspecified fall, initial encounter: Secondary | ICD-10-CM | POA: Diagnosis not present

## 2019-05-25 DIAGNOSIS — R410 Disorientation, unspecified: Secondary | ICD-10-CM

## 2019-05-25 DIAGNOSIS — Y92009 Unspecified place in unspecified non-institutional (private) residence as the place of occurrence of the external cause: Secondary | ICD-10-CM

## 2019-05-25 LAB — POCT URINALYSIS DIPSTICK
Bilirubin, UA: NEGATIVE
Blood, UA: POSITIVE
Glucose, UA: NEGATIVE
Ketones, UA: NEGATIVE
Nitrite, UA: NEGATIVE
Protein, UA: POSITIVE — AB
Spec Grav, UA: 1.025 (ref 1.010–1.025)
Urobilinogen, UA: NEGATIVE E.U./dL — AB
pH, UA: 5 (ref 5.0–8.0)

## 2019-05-25 NOTE — Patient Instructions (Signed)
-   increase water intake 6- 8 glasses of water daily  - urine culture obtained today will call you with result  - Call Provider's office if symptoms worsen or running any fever

## 2019-05-25 NOTE — Progress Notes (Signed)
Provider: Cass Edinger FNP-C  Gayland Curry, DO  Patient Care Team: Gayland Curry, DO as PCP - General (Geriatric Medicine) Minus Breeding, MD as Consulting Physician (Cardiology) Stark Klein, MD as Consulting Physician (General Surgery) Netta Cedars, MD as Consulting Physician (Orthopedic Surgery) Richmond Campbell, MD as Consulting Physician (Gastroenterology) Nat Christen, MD as Attending Physician (Optometry) Hortencia Pilar, MD as Consulting Physician (Surgery) Madelon Lips, MD as Consulting Physician (Nephrology)  Extended Emergency Contact Information Primary Emergency Contact: [place] Phone: [phone] Mobile Phone: [phone] Relation: Daughter Secondary Emergency Contact: Fukuda,Wilmer Address: [address]          [place], [place] Phone: [phone] Relation: Spouse  Code Status:  Full Code  Goals of care: Advanced Directive information Advanced Directives 05/25/2019  Does Patient Have a Medical Advance Directive? Yes  Type of Advance Directive Living will;Healthcare Power of Attorney  Does patient want to make changes to medical advance directive? No - Patient declined  Copy of [place] in Chart? Yes - validated most recent copy scanned in chart (See row information)     Chief Complaint  Patient presents with  . Acute Visit    Complains of UTI     HPI:  Pt is a 86 y.o. female seen today for an acute visit for evaluation of increase confusion.she is here with Son Mr.Apple [name] who provides HPI information patient states does not remember.Son states patient fell Monday afternoon around 5 pm outside the house.states Husband had stepped outside the house and she went looking for him.Husband called young son who lives close by and he came and assisted her off the ground.Her clothes were noted to be wet with urine.No loss of consciousness.No bruises to  head.  she complained of some discomfort on left arm.Son took her to Orthopedic Dr.Murphy.X-ray was done which showed fracture on left shoulder.left arm was placed on a sling.she states does not have any pain on shoulder though son states discomfort to her is pain because she has high tolerance to pain.she takes a tylenol in the morning and at bedtime.she has had no swelling on fingers or redness.Has a walker but walks by holding furniture while in the house.does not remember to use her walker when outside either.   Son is concern that she might have some urinary tract infection that could have made her fall.Has noticed increased confusion.she called son's number saying didn't know where she is. No fever or chills. Also denies any lower abdominal pain,back pain,urgency,frequency or dysuria though HPI limited due to dementia.Son states does not drink enough water.  No x-ray records for review during visit.  Past Medical History:  Diagnosis Date  . Acute bronchitis   . Alzheimer disease ([place])   . Anemia   . Benign neoplasm of uterus, part unspecified   . Chronic ischemic heart disease, unspecified   . Chronic kidney disease, stage II (mild)   . Cough   . Encounter for long-term (current) use of other medications   . Fall 01/30/2016  . Ganglion, unspecified   . Hearing loss   . Heart murmur   . Hyperlipidemia   . Hypertension   . Impacted cerumen   . Light-headed feeling 09/25/2015  . Macular degeneration (senile) of retina, unspecified   . Mitral valve disorders(424.0)   . Nonspecific abnormal electrocardiogram (ECG) (EKG)   . Osteoporosis, unspecified   . Other abnormal blood chemistry   . Other emphysema (  HCC)   . Other malaise and fatigue   . Palpitations   . Senile cataract, unspecified   . Senile purpura ([place]) 06/06/2015  . Thrombocytopenia, unspecified ([place])   . Unspecified hearing loss    Past Surgical History:  Procedure Laterality Date  . BIOPSY  05/30/2018   Procedure:  BIOPSY;  Surgeon: Lavena Bullion, DO;  Location: [facility];  Service: Gastroenterology;;  . ESOPHAGOGASTRODUODENOSCOPY (EGD) WITH PROPOFOL N/A 05/30/2018   Procedure: ESOPHAGOGASTRODUODENOSCOPY (EGD) WITH PROPOFOL;  Surgeon: Lavena Bullion, DO;  Location: [facility];  Service: Gastroenterology;  Laterality: N/A;  . KNEE ARTHROSCOPY     right    Allergies  Allergen Reactions  . Penicillins Nausea And Vomiting and Other (See Comments)    Did it involve swelling of the face/tongue/throat, SOB, or low BP? Unknown Did it involve sudden or severe rash/hives, skin peeling, or any reaction on the inside of your mouth or nose? Unknown Did you need to seek medical attention at a hospital or doctor's office? Unknown When did it last happen?unk If all above answers are "NO", may proceed with cephalosporin use.      Outpatient Encounter Medications as of 05/25/2019  Medication Sig  . Acetaminophen (TYLENOL PO) Take by mouth as needed.  Marland Kitchen aspirin EC 81 MG tablet Take 81 mg by mouth daily.  Marland Kitchen atorvastatin (LIPITOR) 10 MG tablet TAKE 1 TABLET ONCE DAILY TO LOWER CHOLESTEROL.  . BYSTOLIC 5 MG tablet TAKE 1 TABLET ONCE DAILY.  Marland Kitchen Cholecalciferol (VITAMIN D3) 50 MCG (2000 UT) capsule Take 1 capsule (2,000 Units total) by mouth daily.  Marland Kitchen donepezil (ARICEPT) 10 MG tablet TAKE ONE TABLET AT BEDTIME.  Marland Kitchen Ensure (ENSURE) Take 1 Can by mouth daily.  . memantine (NAMENDA XR) 28 MG CP24 24 hr capsule TAKE 1 CAPSULE DAILY.  Marland Kitchen Prenatal Vit-Fe Fumarate-FA (PRENATAL PO) Take 1 tablet by mouth daily.  Marland Kitchen Specialty Vitamins Products (LIPOTRIAD VISION SUPPORT PO) Take 1 tablet by mouth daily.   No facility-administered encounter medications on file as of 05/25/2019.    Review of Systems  Unable to perform ROS: Dementia (Additional information provided by patient's son [name] Apple )  Constitutional: Negative for appetite change, chills, fatigue and fever.  HENT: Negative for congestion, postnasal drip,  rhinorrhea, sinus pressure, sinus pain, sneezing and sore throat.   Eyes: Negative for discharge, redness and itching.  Respiratory: Negative for cough, chest tightness, shortness of breath and wheezing.   Cardiovascular: Negative for chest pain, palpitations and leg swelling.  Gastrointestinal: Negative for abdominal distention, abdominal pain, constipation, diarrhea, nausea and vomiting.  Endocrine: Negative for cold intolerance, heat intolerance, polydipsia, polyphagia and polyuria.  Genitourinary: Negative for difficulty urinating, dysuria, flank pain, frequency and urgency.  Musculoskeletal: Positive for arthralgias and gait problem.       Left shoulder   Skin: Negative for color change, pallor and rash.  Neurological: Negative for dizziness, speech difficulty, weakness, light-headedness and headaches.  Hematological: Does not bruise/bleed easily.  Psychiatric/Behavioral: Positive for confusion. Negative for agitation and sleep disturbance. The patient is not nervous/anxious.     Immunization History  Administered Date(s) Administered  . Fluad Quad(high Dose 65+) 12/09/2018  . Influenza Split 09/18/2017  . Influenza, High Dose Seasonal PF 10/09/2016  . Influenza,inj,Quad PF,6+ Mos 11/23/2012, 11/30/2013, 12/06/2014  . Influenza-Unspecified 10/05/2009, 10/25/2010, 10/13/2011, 11/13/2015  . PFIZER SARS-COV-2 Vaccination 02/12/2019, 03/05/2019  . Pneumococcal Conjugate-13 07/01/2013, 02/09/2017  . Pneumococcal Polysaccharide-23 06/01/2006, 11/23/2012  . Pneumococcal-Unspecified 01/21/1995  . Td 08/20/1992, 06/01/2006, 06/23/2014  .  Tdap 11/25/2012  . Zoster 02/11/2006, 11/14/2010   Pertinent  Health Maintenance Due  Topic Date Due  . MAMMOGRAM  11/22/2017  . INFLUENZA VACCINE  08/21/2019  . DEXA SCAN  Completed  . PNA vac Low Risk Adult  Completed   Fall Risk  05/25/2019 12/21/2018 07/29/2018 06/03/2018 05/25/2018  Falls in the past year? 1 0 0 1 1  Number falls in past yr: 0 0 0 0 0   Injury with Fall? 1 - 0 0 0  Comment - - - - -  Risk Factor Category  - - - - -  Risk for fall due to : - - - - -  Follow up - - - - -    Vitals:   05/25/19 1324  BP: (!) 146/80  Pulse: (!) 56  Resp: 16  Temp: (!) 97.3 F (36.3 C)  SpO2: 97%  Weight: 112 lb 12.5 oz (51.2 kg)  Height: _0  (1.626 m)   Body mass index is 19.36 kg/m. Physical Exam Vitals reviewed.  Constitutional:      General: She is not in acute distress.    Appearance: She is normal weight. She is not ill-appearing.  HENT:     Ears:     Comments: Hearing aids in place  Eyes:     General: No scleral icterus.       Right eye: No discharge.        Left eye: No discharge.     Extraocular Movements: Extraocular movements intact.     Conjunctiva/sclera: Conjunctivae normal.     Pupils: Pupils are equal, round, and reactive to light.  Neck:     Vascular: No carotid bruit.  Cardiovascular:     Rate and Rhythm: Normal rate and regular rhythm.     Pulses: Normal pulses.     Heart sounds: Murmur present. No friction rub. No gallop.   Pulmonary:     Effort: Pulmonary effort is normal. No respiratory distress.     Breath sounds: Normal breath sounds. No wheezing, rhonchi or rales.  Chest:     Chest wall: No tenderness.  Abdominal:     General: Bowel sounds are normal. There is no distension.     Palpations: Abdomen is soft. There is no mass.     Tenderness: There is no abdominal tenderness. There is no right CVA tenderness, left CVA tenderness, guarding or rebound.  Musculoskeletal:        General: No swelling or tenderness.     Right shoulder: Normal.     Left shoulder: No swelling, tenderness or bony tenderness. Decreased range of motion. Decreased strength. Normal pulse.     Cervical back: Normal range of motion. No rigidity or tenderness.     Right lower leg: No edema.     Left lower leg: No edema.     Comments: Unsteady gait son holding under her right arm during visit.left arm sling in place.    Lymphadenopathy:     Cervical: No cervical adenopathy.  Skin:    General: Skin is warm.     Coloration: Skin is not pale.     Findings: No bruising, erythema or rash.  Neurological:     Mental Status: She is alert.     Gait: Gait abnormal.     Comments: Pleasantly confused   Psychiatric:        Mood and Affect: Mood normal.        Speech: Speech normal.  Behavior: Behavior normal.        Thought Content: Thought content normal.        Cognition and Memory: Cognition is impaired. Memory is impaired.    Labs reviewed: Recent Labs    05/29/18 0741 05/30/18 0351 05/31/18 0451  NA 146* 145 143  K 4.8 4.9 4.4  CL 115* 116* 112*  CO2 20* 22 21*  GLUCOSE 148* 96 80  BUN 85* 61* 39*  CREATININE 1.82* 1.79* 1.81*  CALCIUM 8.5* 8.0* 8.0*   Recent Labs    05/26/18 1001 05/29/18 0741  AST 15 14*  ALT 14 13  ALKPHOS  --  38  BILITOT 0.5 0.5  PROT 5.8* 4.9*  ALBUMIN  --  2.5*   Recent Labs    06/16/18 0959 12/09/18 0938 03/08/19 1050  WBC 4.2 5.0 5.5  NEUTROABS 2,667 3.5 3.6  HGB 9.5* 12.8 13.6  HCT 28.9* 39.5 41.7  MCV 96.3 97.4 98.9  PLT 158 133.0* 120.0*   Lab Results  Component Value Date   TSH 2.02 09/25/2015   Lab Results  Component Value Date   HGBA1C 5.5 03/22/2018   Lab Results  Component Value Date   CHOL 199 06/11/2017   HDL 68 06/11/2017   LDLCALC 114 (H) 06/11/2017   TRIG 80 06/11/2017   CHOLHDL 2.9 06/11/2017    Significant Diagnostic Results in last 30 days:  No results found.  Assessment/Plan 1. Dysuria Afebrile. - POC Urinalysis Dipstick shows yellow clear urine negative for Nitrites but positive for blood,protein and large leukocytes 3+.results discussed with patient and son.will send urine for culture and sensitivity aware will take three days for results to return.  - Advised to increase water intake 6- 8 glasses of water daily  - Also advised to call Provider's office if symptoms worsen or running any fever  - Urine  culture   2. Fall at home, initial encounter Status post unwitnessed fall outside the house.she was evaluated by Orthopedic specialist for left shoulder fracture.No x-ray results for review.will obtain lab work to rule out acute abnormalities and metabolic etiologies. Has follow up appointment with Orthopedic.Will obtain labs to rule out infectious and metabolic etiologies.  - CBC with Differential/Platelet - CMP with eGFR([facility])  3. Confusion Son reports increase confusion from her baseline.suspect possible UTI or worsening of dementia.  - CBC with Differential/Platelet - CMP with eGFR([facility]) - Urine culture   4. Unsteady gait Recommend HHPT but son states has a walker but does not remember to use it. - continue fall and safety precaution for now.   Family/ staff Communication: Reviewed plan of care with patient and son verbalized understanding.   Labs/tests ordered:   - CBC with Differential/Platelet - CMP with eGFR([facility]) - Urine culture   Next Appointment: As needed if symptoms worsen or fail to improve.   Sandrea Hughs, NP

## 2019-05-26 LAB — COMPLETE METABOLIC PANEL WITH GFR
AG Ratio: 1.3 (calc) (ref 1.0–2.5)
ALT: 9 U/L (ref 6–29)
AST: 12 U/L (ref 10–35)
Albumin: 3.4 g/dL — ABNORMAL LOW (ref 3.6–5.1)
Alkaline phosphatase (APISO): 87 U/L (ref 37–153)
BUN/Creatinine Ratio: 18 (calc) (ref 6–22)
BUN: 37 mg/dL — ABNORMAL HIGH (ref 7–25)
CO2: 23 mmol/L (ref 20–32)
Calcium: 8.6 mg/dL (ref 8.6–10.4)
Chloride: 108 mmol/L (ref 98–110)
Creat: 2.03 mg/dL — ABNORMAL HIGH (ref 0.60–0.88)
GFR, Est African American: 25 mL/min/{1.73_m2} — ABNORMAL LOW (ref >=60)
GFR, Est Non African American: 22 mL/min/{1.73_m2} — ABNORMAL LOW (ref >=60)
Globulin: 2.6 g/dL (calc) (ref 1.9–3.7)
Glucose, Bld: 109 mg/dL (ref 65–139)
Potassium: 4.6 mmol/L (ref 3.5–5.3)
Sodium: 140 mmol/L (ref 135–146)
Total Bilirubin: 0.5 mg/dL (ref 0.2–1.2)
Total Protein: 6 g/dL — ABNORMAL LOW (ref 6.1–8.1)

## 2019-05-26 LAB — CBC WITH DIFFERENTIAL/PLATELET
Absolute Monocytes: 808 cells/uL (ref 200–950)
Basophils Absolute: 19 cells/uL (ref 0–200)
Basophils Relative: 0.2 %
Eosinophils Absolute: 10 cells/uL — ABNORMAL LOW (ref 15–500)
Eosinophils Relative: 0.1 %
HCT: 33.6 % — ABNORMAL LOW (ref 35.0–45.0)
Hemoglobin: 11.3 g/dL — ABNORMAL LOW (ref 11.7–15.5)
Lymphs Abs: 1083 cells/uL (ref 850–3900)
MCH: 32.6 pg (ref 27.0–33.0)
MCHC: 33.6 g/dL (ref 32.0–36.0)
MCV: 96.8 fL (ref 80.0–100.0)
MPV: 11.3 fL (ref 7.5–12.5)
Monocytes Relative: 8.5 %
Neutro Abs: 7581 cells/uL (ref 1500–7800)
Neutrophils Relative %: 79.8 %
Platelets: 124 10*3/uL — ABNORMAL LOW (ref 140–400)
RBC: 3.47 10*6/uL — ABNORMAL LOW (ref 3.80–5.10)
RDW: 12.3 % (ref 11.0–15.0)
Total Lymphocyte: 11.4 %
WBC: 9.5 10*3/uL (ref 3.8–10.8)

## 2019-05-27 ENCOUNTER — Other Ambulatory Visit: Payer: Self-pay

## 2019-05-27 DIAGNOSIS — R410 Disorientation, unspecified: Secondary | ICD-10-CM

## 2019-05-27 LAB — URINE CULTURE
MICRO NUMBER:: 10446907
SPECIMEN QUALITY:: ADEQUATE

## 2019-05-30 ENCOUNTER — Other Ambulatory Visit: Payer: Self-pay

## 2019-05-30 ENCOUNTER — Other Ambulatory Visit: Payer: Medicare Other

## 2019-05-30 DIAGNOSIS — R410 Disorientation, unspecified: Secondary | ICD-10-CM

## 2019-06-01 LAB — URINE CULTURE
MICRO NUMBER:: 10462960
SPECIMEN QUALITY:: ADEQUATE

## 2019-06-01 NOTE — Progress Notes (Signed)
Can we find out from the lab why this sample was only tested for three antibiotics?  Pt is allergic to ampicillin and we all know nitrofurantion does not actually resolve UTIs.  She certainly is not a candidate for IV antibiotics for a questionable UTI (only 50-100K).

## 2019-06-02 ENCOUNTER — Telehealth: Payer: Self-pay

## 2019-06-02 ENCOUNTER — Other Ambulatory Visit: Payer: Self-pay

## 2019-06-02 ENCOUNTER — Encounter: Payer: Self-pay | Admitting: Internal Medicine

## 2019-06-02 ENCOUNTER — Telehealth (INDEPENDENT_AMBULATORY_CARE_PROVIDER_SITE_OTHER): Payer: Medicare Other | Admitting: Internal Medicine

## 2019-06-02 DIAGNOSIS — G301 Alzheimer's disease with late onset: Secondary | ICD-10-CM | POA: Diagnosis not present

## 2019-06-02 DIAGNOSIS — R2681 Unsteadiness on feet: Secondary | ICD-10-CM

## 2019-06-02 DIAGNOSIS — Z9181 History of falling: Secondary | ICD-10-CM

## 2019-06-02 DIAGNOSIS — R410 Disorientation, unspecified: Secondary | ICD-10-CM | POA: Diagnosis not present

## 2019-06-02 DIAGNOSIS — N3000 Acute cystitis without hematuria: Secondary | ICD-10-CM

## 2019-06-02 DIAGNOSIS — S42295A Other nondisplaced fracture of upper end of left humerus, initial encounter for closed fracture: Secondary | ICD-10-CM

## 2019-06-02 DIAGNOSIS — F0280 Dementia in other diseases classified elsewhere without behavioral disturbance: Secondary | ICD-10-CM

## 2019-06-02 MED ORDER — NITROFURANTOIN MONOHYD MACRO 100 MG PO CAPS: 100.0000 mg | ORAL_CAPSULE | Freq: Two times a day (BID) | ORAL | 0 refills | Status: DC

## 2019-06-02 NOTE — Progress Notes (Signed)
This service is provided via telemedicine  No vital signs collected/recorded due to the encounter was a telemedicine visit.   Location of patient (ex: home, work) Home  Patient consents to a telephone visit:  yes  Location of the provider (ex: office, home):  [place]  Name of any referring provider:Dr. Mariea Clonts DO Names of all persons participating in the telemedicine service and their role in the encounter: [name], Janalee Dane, Wilma  and patient   Time spent on call: 84min    Provider:  Weslie Pretlow L. Mariea Clonts, D.O., C.M.D.  Code Status: DNR Goals of Care:  Advanced Directives 06/02/2019  Does Patient Have a Medical Advance Directive? Yes  Type of Paramedic of [place];Living will  Does patient want to make changes to medical advance directive? No - Patient declined  Copy of [place] in Chart? Yes - validated most recent copy scanned in chart (See row information)   Chief Complaint  Patient presents with  . Medical Management of Chronic Issues    Dementia concerns     HPI: Patient is a 86 y.o. female seen today virtually for medical management of chronic diseases and dementia concerns.  Her sons, [name] and Janalee Dane, were also present on the visit.  Mrs. Trautmann was noted to be pleasantly confused and wearing a sling on her left arm.  She reported doing just fine.  She denied pain but often does per her children.  She was accompanied by her husband, Wilmer, who also was wearing a sling on his left arm.  [name] and Jonni Sanger note that Reese has been much more confused.  Monday a week ago, Wilmer was out doing yard work and she called [name] to tell him she could not find him and didn't know where he was.  She apparently went outside and fell in the yard.  Had incontinence at that time.  Wilmer then found her and called Jonni Sanger who called [name] when pt reported her shoulder "got her attention".  She was taken to [facility] at 6pm for after hours urgent care  and proximal humerus fx found.  She was placed in a sling.  She's had quite a bit of swelling with her hand as big as a boxing glove at one point.  Now it's been bleeding some since yesterday.   She is now seeing Dr. Malena Catholic at [facility] ortho and her next appt is 5/21.    Wilmer's family had gotten a caregiver assessment done on him yesterday and pt was even more confused after that b/c she thinks she must continually care for him though she's not able with her dementia.    Her appetite is good, but she cannot prepare food or remember when and if she has already eaten.    Recently, she is thinking there are other people in the house--little children.    Andy stays in the basement and he puts her clothes in the hamper at night so she won't put the same ones back on.  This has been the case for quite some time now.  She also needs the clothes laid out for her to put on an appropriate outfit.  She has the ability to walk around but staggers a lot.  Has a shuffling gait and Jonni Sanger can tell who is walking by listening to their steps from downstairs.    She does not remember to eat.  She forgets she lives there.  Of note, she did previously live somewhere  else until she married Wilmer (both of their first spouses had passed away around the same time and then they married).  She then moved into [place] home which she still thinks of as his deceased wife's home and it still looks like it did back then apparently.  She's been seen trying to pull her sling off.  Does not c/o pain and seems unaware she has the injury.      Wilmer cannot hear well so if there was an emergency and yelled for him, he would not hear her.  There are recently concerns about who is more capable in the home.  There are concerns that Sabiha's dementia is wearing on Wilmer (he is 95).   She does not always recognize Wilmer and Jonni Sanger.    Her sons think she would benefit from things to do during the day.    They asked if  she's maximized on her memory meds and she is.    Note that Wilmer had fallen shortly before Consuella did and has a fractured clavicle himself on the left.    Past Medical History:  Diagnosis Date  . Acute bronchitis   . Alzheimer disease ([place])   . Anemia   . Benign neoplasm of uterus, part unspecified   . Chronic ischemic heart disease, unspecified   . Chronic kidney disease, stage II (mild)   . Cough   . Encounter for long-term (current) use of other medications   . Fall 01/30/2016  . Ganglion, unspecified   . Hearing loss   . Heart murmur   . Hyperlipidemia   . Hypertension   . Impacted cerumen   . Light-headed feeling 09/25/2015  . Macular degeneration (senile) of retina, unspecified   . Mitral valve disorders(424.0)   . Nonspecific abnormal electrocardiogram (ECG) (EKG)   . Osteoporosis, unspecified   . Other abnormal blood chemistry   . Other emphysema ([place])   . Other malaise and fatigue   . Palpitations   . Senile cataract, unspecified   . Senile purpura ([place]) 06/06/2015  . Thrombocytopenia, unspecified ([place])   . Unspecified hearing loss     Past Surgical History:  Procedure Laterality Date  . BIOPSY  05/30/2018   Procedure: BIOPSY;  Surgeon: Lavena Bullion, DO;  Location: [facility];  Service: Gastroenterology;;  . ESOPHAGOGASTRODUODENOSCOPY (EGD) WITH PROPOFOL N/A 05/30/2018   Procedure: ESOPHAGOGASTRODUODENOSCOPY (EGD) WITH PROPOFOL;  Surgeon: Lavena Bullion, DO;  Location: [facility];  Service: Gastroenterology;  Laterality: N/A;  . KNEE ARTHROSCOPY     right    Allergies  Allergen Reactions  . Penicillins Nausea And Vomiting and Other (See Comments)    Did it involve swelling of the face/tongue/throat, SOB, or low BP? Unknown Did it involve sudden or severe rash/hives, skin peeling, or any reaction on the inside of your mouth or nose? Unknown Did you need to seek medical attention at a hospital or doctor's office? Unknown When did it last  happen?unk If all above answers are "NO", may proceed with cephalosporin use.      Outpatient Encounter Medications as of 06/02/2019  Medication Sig  . Acetaminophen (TYLENOL PO) Take by mouth as needed.  Marland Kitchen aspirin EC 81 MG tablet Take 81 mg by mouth daily.  Marland Kitchen atorvastatin (LIPITOR) 10 MG tablet TAKE 1 TABLET ONCE DAILY TO LOWER CHOLESTEROL.  . BYSTOLIC 5 MG tablet TAKE 1 TABLET ONCE DAILY.  Marland Kitchen Cholecalciferol (VITAMIN D3) 50 MCG (2000 UT) capsule Take 1 capsule (2,000 Units total) by mouth daily.  Marland Kitchen  donepezil (ARICEPT) 10 MG tablet TAKE ONE TABLET AT BEDTIME.  Marland Kitchen Ensure (ENSURE) Take 1 Can by mouth daily.  . memantine (NAMENDA XR) 28 MG CP24 24 hr capsule TAKE 1 CAPSULE DAILY.  Marland Kitchen Prenatal Vit-Fe Fumarate-FA (PRENATAL PO) Take 1 tablet by mouth daily.  Marland Kitchen Specialty Vitamins Products (LIPOTRIAD VISION SUPPORT PO) Take 1 tablet by mouth daily.   No facility-administered encounter medications on file as of 06/02/2019.    Review of Systems:  Review of Systems  Constitutional: Positive for weight loss. Negative for chills, fever and malaise/fatigue.  Eyes: Negative for blurred vision.  Respiratory: Negative for shortness of breath.   Cardiovascular: Negative for chest pain, palpitations and leg swelling.  Gastrointestinal: Negative for abdominal pain, constipation, diarrhea, nausea and vomiting.  Genitourinary: Positive for frequency and urgency. Negative for dysuria, flank pain and hematuria.  Musculoskeletal: Positive for falls and joint pain.  Skin: Negative for itching and rash.  Neurological: Negative for dizziness and loss of consciousness.       Unsteady gait, won't use assistive device, furniture surfs  Endo/Heme/Allergies: Bruises/bleeds easily.  Psychiatric/Behavioral: Positive for hallucinations and memory loss. Negative for depression and suicidal ideas. The patient is nervous/anxious. The patient does not have insomnia.     Health Maintenance  Topic Date Due  .  MAMMOGRAM  11/22/2017  . INFLUENZA VACCINE  08/21/2019  . TETANUS/TDAP  06/22/2024  . DEXA SCAN  Completed  . COVID-19 Vaccine  Completed  . PNA vac Low Risk Adult  Completed    Physical Exam: Could not be performed as visit non face-to-face via phone   Labs reviewed: Basic Metabolic Panel: Recent Labs    05/25/19 1427  NA 140  K 4.6  CL 108  CO2 23  GLUCOSE 109  BUN 37*  CREATININE 2.03*  CALCIUM 8.6   Liver Function Tests: Recent Labs    05/25/19 1427  AST 12  ALT 9  BILITOT 0.5  PROT 6.0*   Recent Labs    06/16/18 0959  LIPASE 114*  AMYLASE 58   No results for input(s): AMMONIA in the last 8760 hours. CBC: Recent Labs    12/09/18 0938 03/08/19 1050 05/25/19 1427  WBC 5.0 5.5 9.5  NEUTROABS 3.5 3.6 7,581  HGB 12.8 13.6 11.3*  HCT 39.5 41.7 33.6*  MCV 97.4 98.9 96.8  PLT 133.0* 120.0* 124*   Lipid Panel: No results for input(s): CHOL, HDL, LDLCALC, TRIG, CHOLHDL, LDLDIRECT in the last 8760 hours. Lab Results  Component Value Date   HGBA1C 5.5 03/22/2018   Assessment/Plan 1. Acute cystitis without hematuria - due to declining cognitive status and more urgency and incontinence, will tx for UTI --cx grew out enterococcus sensitive only to penicillins (allergic), nitrofurantoin and vancomycin (not a good option) - nitrofurantoin, macrocrystal-monohydrate, (MACROBID) 100 MG capsule; Take 1 capsule (100 mg total) by mouth 2 (two) times daily.  Dispense: 14 capsule; Refill: 0 - Ambulatory referral to Home Health  2. Delirium -seems she has this infection that's made her more confused, more unsteady and led to her fall when looking outside for her husband w/o using a walker - she ended up fracturing her left proximal humerus - Ambulatory referral to [facility]  3. Late onset Alzheimer's disease without behavioral disturbance ([place]) - is now in moderate stages and she is requiring considerable assistance with all adls except to feed herself -  discussed options for memory care center during days (but would be separate from Wilmer which might be challenging) and  home care assistance  - they have long-term care insurance that needs some paperwork completed to begin the process -they also think she will eventually need LTC when Wilmer who is of advanced age and not in great health himself passes away - Ambulatory referral to [facility] -I have contacted [facility] to assist [name] and Jonni Sanger with next steps--perhaps [facility] and certainly some home care assistance, possibly a [facility]-term plan for [facility]  4. H/O fall - will refer for some PT, OT at home--has had before, but balance has gotten much worse since then and more eyes on this frail lady at this point seems like a good idea - Ambulatory referral to [facility]  5. Other closed nondisplaced fracture of proximal end of left humerus, initial encounter - not yet doing therapy for the fracture itself--in sling, but limiting ability to otherwise function  - Ambulatory referral to [facility]  6. Unsteady gait -pt has never taken to using an assistive device despite repeat recommendations from physicians and therapy - Ambulatory referral to [facility]  Labs/tests ordered:  UA c+s already done Next appt:  06/02/2019 Non face-to-face time spent on televisit:  45 minutes  Jakeline Dave L. Emmerson Shuffield, D.O. [facility] [address], [place] [address] Cell Phone (Mon-Fri 8am-5pm):  [phone] On Call:  [phone] & follow prompts after 5pm & weekends Office Phone:  [phone] Office Fax:  [phone]

## 2019-06-02 NOTE — Telephone Encounter (Signed)
Consent given  

## 2019-06-04 ENCOUNTER — Other Ambulatory Visit: Payer: Self-pay | Admitting: Internal Medicine

## 2019-06-16 DIAGNOSIS — S42202D Unspecified fracture of upper end of left humerus, subsequent encounter for fracture with routine healing: Secondary | ICD-10-CM

## 2019-06-16 DIAGNOSIS — N3000 Acute cystitis without hematuria: Secondary | ICD-10-CM

## 2019-06-16 DIAGNOSIS — H3530 Unspecified macular degeneration: Secondary | ICD-10-CM

## 2019-06-16 DIAGNOSIS — J438 Other emphysema: Secondary | ICD-10-CM

## 2019-06-16 DIAGNOSIS — G301 Alzheimer's disease with late onset: Secondary | ICD-10-CM

## 2019-06-16 DIAGNOSIS — F0280 Dementia in other diseases classified elsewhere without behavioral disturbance: Secondary | ICD-10-CM

## 2019-06-16 DIAGNOSIS — N182 Chronic kidney disease, stage 2 (mild): Secondary | ICD-10-CM

## 2019-06-16 DIAGNOSIS — I129 Hypertensive chronic kidney disease with stage 1 through stage 4 chronic kidney disease, or unspecified chronic kidney disease: Secondary | ICD-10-CM

## 2019-06-30 ENCOUNTER — Ambulatory Visit (INDEPENDENT_AMBULATORY_CARE_PROVIDER_SITE_OTHER): Payer: Medicare Other | Admitting: Family

## 2019-06-30 ENCOUNTER — Other Ambulatory Visit: Payer: Self-pay

## 2019-06-30 ENCOUNTER — Encounter: Payer: Self-pay | Admitting: Family

## 2019-06-30 VITALS — BP 150/90 | HR 50 | Temp 98.0°F | Resp 16 | Ht 64.0 in | Wt 107.8 lb

## 2019-06-30 DIAGNOSIS — I10 Essential (primary) hypertension: Secondary | ICD-10-CM

## 2019-06-30 MED ORDER — AMLODIPINE BESYLATE 2.5 MG PO TABS: 5.0000 mg | ORAL_TABLET | Freq: Every day | ORAL | 0 refills | Status: DC

## 2019-06-30 MED ORDER — CLONIDINE HCL 0.1 MG PO TABS
0.1000 mg | ORAL_TABLET | Freq: Once | ORAL | Status: AC
  Administered 2019-06-30: 0.1 mg via ORAL

## 2019-06-30 NOTE — Patient Instructions (Signed)
-   increase water intake to 6-8 glasses daily  - Take Amlodipine 2.5 mg tablet one by mouth daily for high blood pressure  - Check blood pressure once daily and notify provider if B/p > 140/90

## 2019-06-30 NOTE — Progress Notes (Signed)
Provider: Achille Xiang FNP-C  Gayland Curry, DO  Patient Care Team: Gayland Curry, DO as PCP - General (Geriatric Medicine) Minus Breeding, MD as Consulting Physician (Cardiology) Stark Klein, MD as Consulting Physician (General Surgery) Netta Cedars, MD as Consulting Physician (Orthopedic Surgery) Richmond Campbell, MD as Consulting Physician (Gastroenterology) Nat Christen, MD as Attending Physician (Optometry) Hortencia Pilar, MD as Consulting Physician (Surgery) Madelon Lips, MD as Consulting Physician (Nephrology)  Extended Emergency Contact Information Primary Emergency Contact: [place] Phone: [phone] Mobile Phone: [phone] Relation: Daughter Secondary Emergency Contact: Gidley,Wilmer Address: [address]          [place], [place] Phone: [phone] Relation: Spouse  Code Status: Full Code  Goals of care: Advanced Directive information Advanced Directives 06/30/2019  Does Patient Have a Medical Advance Directive? Yes  Type of Advance Directive Living will;Healthcare Power of Attorney  Does patient want to make changes to medical advance directive? No - Patient declined  Copy of [place] in Chart? Yes - validated most recent copy scanned in chart (See row information)     Chief Complaint  Patient presents with  . Acute Visit    BP Running High.    HPI:  Pt is a 86 y.o. female seen today for an acute visit for evaluation of high blood pressure at home.she is here with son [name].He states B/p was elevated yesterday had to cancel therapy.Home log readings in the 170's/90 -190's/100's.This morning's B/p was 120/70 an hour and a half after taking Bystolic.HR runs in the 50's.she denies any headache,dizziness,vision changes, chest pain or shortness of breath. Has been off left hand sling since Monday 06/27/2019.states no pain but just discomfort.Had a  Tylenol PM last night for sleep.Usually takes melatonin as needed but tylenol Pm was given to prevent pain from waking her up.Has follow up appointment with Orthopedic 07/01/2019.    Past Medical History:  Diagnosis Date  . Acute bronchitis   . Alzheimer disease ([place])   . Anemia   . Benign neoplasm of uterus, part unspecified   . Chronic ischemic heart disease, unspecified   . Chronic kidney disease, stage II (mild)   . Cough   . Encounter for long-term (current) use of other medications   . Fall 01/30/2016  . Ganglion, unspecified   . Hearing loss   . Heart murmur   . Hyperlipidemia   . Hypertension   . Impacted cerumen   . Light-headed feeling 09/25/2015  . Macular degeneration (senile) of retina, unspecified   . Mitral valve disorders(424.0)   . Nonspecific abnormal electrocardiogram (ECG) (EKG)   . Osteoporosis, unspecified   . Other abnormal blood chemistry   . Other emphysema ([place])   . Other malaise and fatigue   . Palpitations   . Senile cataract, unspecified   . Senile purpura ([place]) 06/06/2015  . Thrombocytopenia, unspecified ([place])   . Unspecified hearing loss    Past Surgical History:  Procedure Laterality Date  . BIOPSY  05/30/2018   Procedure: BIOPSY;  Surgeon: Lavena Bullion, DO;  Location: [facility];  Service: Gastroenterology;;  . ESOPHAGOGASTRODUODENOSCOPY (EGD) WITH PROPOFOL N/A 05/30/2018   Procedure: ESOPHAGOGASTRODUODENOSCOPY (EGD) WITH PROPOFOL;  Surgeon: Lavena Bullion, DO;  Location: [facility];  Service: Gastroenterology;  Laterality: N/A;  . KNEE ARTHROSCOPY     right    Allergies  Allergen Reactions  . Penicillins Nausea And Vomiting and Other (See Comments)  Did it involve swelling of the face/tongue/throat, SOB, or low BP? Unknown Did it involve sudden or severe rash/hives, skin peeling, or any reaction on the inside of your mouth or nose? Unknown Did you need to seek medical attention at a hospital or doctor's office? Unknown When  did it last happen?unk If all above answers are "NO", may proceed with cephalosporin use.      Outpatient Encounter Medications as of 06/30/2019  Medication Sig  . Acetaminophen (TYLENOL PO) Take by mouth as needed.  Marland Kitchen aspirin EC 81 MG tablet Take 81 mg by mouth daily.  Marland Kitchen atorvastatin (LIPITOR) 10 MG tablet TAKE 1 TABLET ONCE DAILY TO LOWER CHOLESTEROL.  . BYSTOLIC 5 MG tablet TAKE 1 TABLET ONCE DAILY.  Marland Kitchen Cholecalciferol (VITAMIN D3) 50 MCG (2000 UT) capsule Take 1 capsule (2,000 Units total) by mouth daily.  Marland Kitchen donepezil (ARICEPT) 10 MG tablet TAKE ONE TABLET AT BEDTIME.  Marland Kitchen Ensure (ENSURE) Take 1 Can by mouth daily.  . memantine (NAMENDA XR) 28 MG CP24 24 hr capsule TAKE 1 CAPSULE DAILY.  . nitrofurantoin, macrocrystal-monohydrate, (MACROBID) 100 MG capsule Take 1 capsule (100 mg total) by mouth 2 (two) times daily.  . Prenatal Vit-Fe Fumarate-FA (PRENATAL PO) Take 1 tablet by mouth daily.  Marland Kitchen Specialty Vitamins Products (LIPOTRIAD VISION SUPPORT PO) Take 1 tablet by mouth daily.   No facility-administered encounter medications on file as of 06/30/2019.    Review of Systems  Constitutional: Negative for appetite change, chills, fatigue and fever.  HENT: Negative for congestion, rhinorrhea, sinus pressure, sinus pain, sneezing and sore throat.   Eyes: Positive for visual disturbance. Negative for discharge, redness and itching.  Respiratory: Negative for cough, chest tightness, shortness of breath and wheezing.   Cardiovascular: Negative for chest pain, palpitations and leg swelling.  Gastrointestinal: Negative for abdominal distention, abdominal pain, constipation, diarrhea, nausea and vomiting.  Genitourinary:       Has completed antibiotics for UTI   Musculoskeletal: Positive for gait problem. Negative for arthralgias.       Left shoulder   Skin: Negative for color change, pallor and rash.  Neurological: Negative for dizziness, speech difficulty, weakness, light-headedness and  headaches.  Psychiatric/Behavioral:       Has been more anxious with husband being admitted in the hospital ? Stroke though possible discharge today.     Immunization History  Administered Date(s) Administered  . Fluad Quad(high Dose 65+) 12/09/2018  . Influenza Split 09/18/2017  . Influenza, High Dose Seasonal PF 10/09/2016  . Influenza,inj,Quad PF,6+ Mos 11/23/2012, 11/30/2013, 12/06/2014  . Influenza-Unspecified 10/05/2009, 10/25/2010, 10/13/2011, 11/13/2015  . PFIZER SARS-COV-2 Vaccination 02/12/2019, 03/05/2019  . Pneumococcal Conjugate-13 07/01/2013, 02/09/2017  . Pneumococcal Polysaccharide-23 06/01/2006, 11/23/2012  . Pneumococcal-Unspecified 01/21/1995  . Td 08/20/1992, 06/01/2006, 06/23/2014  . Tdap 11/25/2012  . Zoster 02/11/2006, 11/14/2010   Pertinent  Health Maintenance Due  Topic Date Due  . MAMMOGRAM  11/22/2017  . INFLUENZA VACCINE  08/21/2019  . DEXA SCAN  Completed  . PNA vac Low Risk Adult  Completed   Fall Risk  06/30/2019 06/02/2019 05/25/2019 12/21/2018 07/29/2018  Falls in the past year? 1 1 1  0 0  Number falls in past yr: 0 1 0 0 0  Injury with Fall? 1 - 1 - 0  Comment - - - - -  Risk Factor Category  - - - - -  Risk for fall due to : - - - - -  Follow up - - - - -  Vitals:   06/30/19 0913  BP: (!) 160/100  Pulse: (!) 50  Resp: 16  Temp: 98 F (36.7 C)  SpO2: 96%  Weight: 107 lb 12.8 oz (48.9 kg)  Height: 5\' 4"  (1.626 m)   Body mass index is 18.5 kg/m. Physical Exam Vitals reviewed.  Constitutional:      General: She is not in acute distress.    Appearance: She is underweight. She is not ill-appearing.  Eyes:     General: No scleral icterus.       Right eye: No discharge.        Left eye: No discharge.     Conjunctiva/sclera: Conjunctivae normal.     Pupils: Pupils are equal, round, and reactive to light.     Comments: Corrective lens in place   Cardiovascular:     Rate and Rhythm: Normal rate and regular rhythm.     Pulses: Normal  pulses.     Heart sounds: Normal heart sounds. No murmur heard.  No friction rub. No gallop.   Pulmonary:     Effort: Pulmonary effort is normal. No respiratory distress.     Breath sounds: Normal breath sounds. No wheezing, rhonchi or rales.  Chest:     Chest wall: No tenderness.  Abdominal:     General: Bowel sounds are normal. There is no distension.     Palpations: Abdomen is soft. There is no mass.     Tenderness: There is no abdominal tenderness. There is no right CVA tenderness, left CVA tenderness, guarding or rebound.  Musculoskeletal:        General: No swelling or tenderness.     Right lower leg: No edema.     Left lower leg: No edema.     Comments: Left shoulder limited ROM nontender   Skin:    General: Skin is warm and dry.     Coloration: Skin is not pale.     Findings: No bruising, erythema or rash.  Neurological:     Mental Status: Mental status is at baseline.     Cranial Nerves: No cranial nerve deficit.     Sensory: No sensory deficit.     Motor: No weakness.     Gait: Gait normal.  Psychiatric:        Mood and Affect: Mood normal.        Behavior: Behavior normal.        Thought Content: Thought content normal.        Judgment: Judgment normal.    Labs reviewed: Recent Labs    05/25/19 1427  NA 140  K 4.6  CL 108  CO2 23  GLUCOSE 109  BUN 37*  CREATININE 2.03*  CALCIUM 8.6   Recent Labs    05/25/19 1427  AST 12  ALT 9  BILITOT 0.5  PROT 6.0*   Recent Labs    12/09/18 0938 03/08/19 1050 05/25/19 1427  WBC 5.0 5.5 9.5  NEUTROABS 3.5 3.6 7,581  HGB 12.8 13.6 11.3*  HCT 39.5 41.7 33.6*  MCV 97.4 98.9 96.8  PLT 133.0* 120.0* 124*   Lab Results  Component Value Date   TSH 2.02 09/25/2015   Lab Results  Component Value Date   HGBA1C 5.5 03/22/2018   Lab Results  Component Value Date   CHOL 199 06/11/2017   HDL 68 06/11/2017   LDLCALC 114 (H) 06/11/2017   TRIG 80 06/11/2017   CHOLHDL 2.9 06/11/2017    Significant  Diagnostic Results in last 30 days:  No results found.  Assessment/Plan 1. Hypertension, unspecified type B/p elevated.Home readings also high.clonidine given this visit with improvement. - will initiate amlodipine 2.5 mg tablet daily.side effects discussed with patient verbalize understanding. - cloNIDine (CATAPRES) tablet 0.1 mg - amLODipine (NORVASC) 2.5 MG tablet; Take 2 tablets (5 mg total) by mouth daily.  Dispense: 30 tablet; Refill: 0 Family/ staff Communication: Reviewed plan of care with patient verbalized.  Labs/tests ordered: None   Next Appointment: 1 week for B/p evaluation with Dr.Reed or Dani Gobble I will off next week on vacation.  Sandrea Hughs, NP

## 2019-07-06 ENCOUNTER — Ambulatory Visit (INDEPENDENT_AMBULATORY_CARE_PROVIDER_SITE_OTHER): Payer: Medicare Other | Admitting: Nurse Practitioner

## 2019-07-06 ENCOUNTER — Other Ambulatory Visit: Payer: Self-pay

## 2019-07-06 ENCOUNTER — Encounter: Payer: Self-pay | Admitting: Nurse Practitioner

## 2019-07-06 VITALS — BP 146/82 | HR 92 | Temp 96.2°F | Ht 64.0 in | Wt 109.0 lb

## 2019-07-06 DIAGNOSIS — F419 Anxiety disorder, unspecified: Secondary | ICD-10-CM | POA: Diagnosis not present

## 2019-07-06 DIAGNOSIS — I10 Essential (primary) hypertension: Secondary | ICD-10-CM

## 2019-07-06 MED ORDER — SERTRALINE HCL 25 MG PO TABS: 25.0000 mg | ORAL_TABLET | Freq: Every day | ORAL | 1 refills | Status: DC

## 2019-07-06 NOTE — Patient Instructions (Signed)
To start zoloft 25 mg daily  Follow up with DR REED (PCP) in 1 month for routine follow up and anxiety

## 2019-07-06 NOTE — Progress Notes (Signed)
Careteam: Patient Care Team: Gayland Curry, DO as PCP - General (Geriatric Medicine) Minus Breeding, MD as Consulting Physician (Cardiology) Stark Klein, MD as Consulting Physician (General Surgery) Netta Cedars, MD as Consulting Physician (Orthopedic Surgery) Richmond Campbell, MD as Consulting Physician (Gastroenterology) Nat Christen, MD as Attending Physician (Optometry) Hortencia Pilar, MD as Consulting Physician (Surgery) Madelon Lips, MD as Consulting Physician (Nephrology)  PLACE OF SERVICE:  [place]  Advanced Directive information    Allergies  Allergen Reactions   Penicillins Nausea And Vomiting and Other (See Comments)    Did it involve swelling of the face/tongue/throat, SOB, or low BP? Unknown Did it involve sudden or severe rash/hives, skin peeling, or any reaction on the inside of your mouth or nose? Unknown Did you need to seek medical attention at a hospital or doctor's office? Unknown When did it last happen?unk If all above answers are "NO", may proceed with cephalosporin use.      Chief Complaint  Patient presents with   Follow-up    1 week blood pressure follow-up. Here with daughter [name]     HPI: Patient is a 86 y.o. female for blood pressure follow up. norvasc 2.5 mg daily was added to her blood pressure regimen. Took medication this morning.  No headaches, chest pains, or shortness of breath.  No swelling in legs.   Overall improvement in blood pressure, last night was high (159/84) however bp was taken after shower and she had had increase in anxiety.  Otherwise, Home bp readings ranging from 101-155/60-84  Daughter here with her today and reports "we are begging" for anxiety medication. Anxiety at a all time high. She gets very upset when she is confused. They can not talk her through it. Evenings are worse. Family is trying to talk her through it but sometimes this is not effective. Has a hard time getting her  to go to sleep.    Review of Systems:  Review of Systems  Unable to perform ROS: Dementia    Past Medical History:  Diagnosis Date   Acute bronchitis    Alzheimer disease ([place])    Anemia    Benign neoplasm of uterus, part unspecified    Chronic ischemic heart disease, unspecified    Chronic kidney disease, stage II (mild)    Cough    Encounter for long-term (current) use of other medications    Fall 01/30/2016   Ganglion, unspecified    Hearing loss    Heart murmur    Hyperlipidemia    Hypertension    Impacted cerumen    Light-headed feeling 09/25/2015   Macular degeneration (senile) of retina, unspecified    Mitral valve disorders(424.0)    Nonspecific abnormal electrocardiogram (ECG) (EKG)    Osteoporosis, unspecified    Other abnormal blood chemistry    Other emphysema (HCC)    Other malaise and fatigue    Palpitations    Senile cataract, unspecified    Senile purpura ([place]) 06/06/2015   Thrombocytopenia, unspecified ([place])    Unspecified hearing loss    Past Surgical History:  Procedure Laterality Date   BIOPSY  05/30/2018   Procedure: BIOPSY;  Surgeon: Lavena Bullion, DO;  Location: [facility];  Service: Gastroenterology;;   ESOPHAGOGASTRODUODENOSCOPY (EGD) WITH PROPOFOL N/A 05/30/2018   Procedure: ESOPHAGOGASTRODUODENOSCOPY (EGD) WITH PROPOFOL;  Surgeon: Lavena Bullion, DO;  Location: [facility];  Service: Gastroenterology;  Laterality: N/A;   KNEE ARTHROSCOPY     right   Social History:  reports that she has never smoked. She has never used smokeless tobacco. She reports that she does not drink alcohol and does not use drugs.  Family History  Problem Relation Age of Onset   Stroke Brother    Cancer Sister        lung   Parkinsonism Father        Alzheimer's   Colon cancer Neg Hx    Esophageal cancer Neg Hx    Rectal cancer Neg Hx    Stomach cancer Neg Hx     Medications: Patient's Medications  New  Prescriptions   No medications on file  Previous Medications   ACETAMINOPHEN (TYLENOL PO)    Take by mouth as needed.   AMLODIPINE (NORVASC) 2.5 MG TABLET    Take 2 tablets (5 mg total) by mouth daily.   ASPIRIN EC 81 MG TABLET    Take 81 mg by mouth daily.   ATORVASTATIN (LIPITOR) 10 MG TABLET    TAKE 1 TABLET ONCE DAILY TO LOWER CHOLESTEROL.   BYSTOLIC 5 MG TABLET    TAKE 1 TABLET ONCE DAILY.   CHOLECALCIFEROL (VITAMIN D3) 50 MCG (2000 UT) CAPSULE    Take 1 capsule (2,000 Units total) by mouth daily.   DONEPEZIL (ARICEPT) 10 MG TABLET    TAKE ONE TABLET AT BEDTIME.   ENSURE (ENSURE)    Take 1 Can by mouth daily.   MEMANTINE (NAMENDA XR) 28 MG CP24 24 HR CAPSULE    TAKE 1 CAPSULE DAILY.   NITROFURANTOIN, MACROCRYSTAL-MONOHYDRATE, (MACROBID) 100 MG CAPSULE    Take 1 capsule (100 mg total) by mouth 2 (two) times daily.   PRENATAL VIT-FE FUMARATE-FA (PRENATAL PO)    Take 1 tablet by mouth daily.   SPECIALTY VITAMINS PRODUCTS (LIPOTRIAD VISION SUPPORT PO)    Take 1 tablet by mouth daily.  Modified Medications   No medications on file  Discontinued Medications   No medications on file    Physical Exam:  Vitals:   07/06/19 0830  BP: (!) 146/82  Pulse: 92  Temp: (!) 96.2 F (35.7 C)  TempSrc: Temporal  SpO2: 98%  Weight: 109 lb (49.4 kg)  Height: 5\' 4"  (1.626 m)   Body mass index is 18.71 kg/m. Wt Readings from Last 3 Encounters:  07/06/19 109 lb (49.4 kg)  06/30/19 107 lb 12.8 oz (48.9 kg)  05/25/19 112 lb 12.5 oz (51.2 kg)    Physical Exam Constitutional:      General: She is not in acute distress.    Appearance: She is well-developed. She is not diaphoretic.  HENT:     Head: Normocephalic and atraumatic.  Eyes:     Pupils: Pupils are equal, round, and reactive to light.  Cardiovascular:     Rate and Rhythm: Normal rate and regular rhythm.     Heart sounds: Normal heart sounds.  Pulmonary:     Effort: Pulmonary effort is normal.     Breath sounds: Normal breath  sounds.  Musculoskeletal:     Cervical back: Normal range of motion and neck supple.  Skin:    General: Skin is warm and dry.  Neurological:     Mental Status: She is alert. Mental status is at baseline.  Psychiatric:        Cognition and Memory: Cognition is impaired. Memory is impaired. She exhibits impaired recent memory and impaired remote memory.    Labs reviewed: Basic Metabolic Panel: Recent Labs    05/25/19 1427  NA 140  K 4.6  CL 108  CO2 23  GLUCOSE 109  BUN 37*  CREATININE 2.03*  CALCIUM 8.6   Liver Function Tests: Recent Labs    05/25/19 1427  AST 12  ALT 9  BILITOT 0.5  PROT 6.0*   No results for input(s): LIPASE, AMYLASE in the last 8760 hours. No results for input(s): AMMONIA in the last 8760 hours. CBC: Recent Labs    12/09/18 0938 03/08/19 1050 05/25/19 1427  WBC 5.0 5.5 9.5  NEUTROABS 3.5 3.6 7,581  HGB 12.8 13.6 11.3*  HCT 39.5 41.7 33.6*  MCV 97.4 98.9 96.8  PLT 133.0* 120.0* 124*   Lipid Panel: No results for input(s): CHOL, HDL, LDLCALC, TRIG, CHOLHDL, LDLDIRECT in the last 8760 hours. TSH: No results for input(s): TSH in the last 8760 hours. A1C: Lab Results  Component Value Date   HGBA1C 5.5 03/22/2018     Assessment/Plan 1. Hypertension, unspecified type -at goal, now on norvasc 2.5 mg added to bystolic 5 m daily. Stressed importance of not adding additional salt and low sodium food choices. bp improved to 138/84 on recheck.   2. Anxiety -at a all time high, stress with husband being sick, progressive decline in memory. Will start zoloft 25 mg daily  - sertraline (ZOLOFT) 25 MG tablet; Take 1 tablet (25 mg total) by mouth daily.  Dispense: 30 tablet; Refill: 1  Next appt: 1 month with PCP  Aman Batley K. [place], [facility] [phone]

## 2019-07-15 ENCOUNTER — Telehealth: Payer: Self-pay | Admitting: *Deleted

## 2019-07-15 MED ORDER — NEBIVOLOL HCL 2.5 MG PO TABS: 2.5000 mg | ORAL_TABLET | Freq: Every day | ORAL | 3 refills | Status: DC

## 2019-07-15 NOTE — Telephone Encounter (Signed)
Patient son, [name] notified and agreed.  Medication list updated and Rx sent to pharmacy.

## 2019-07-15 NOTE — Telephone Encounter (Signed)
Lets have her reduce the bystolic to 2.5 mg daily (will need to call in new Rx to pharmacy of choice) , this has the effects on the HR. Continue to monitor BP

## 2019-07-15 NOTE — Telephone Encounter (Signed)
Son, [name] called and stated that he had concerns regarding patient's new BP medication Amlodipine. Stated that patient is fatigue and legs weak and tired and not wanting to work. Stated that her pulse has been running low/high. Takes Amlodipine in the morning after breakfast with the Bystolic. (takes BP 45 minutes to an hour after taking medication) Has an appointment on Monday to fill out FL2 form for [facility].  Please Advise.   BP Readings: 07/09/19 6:30pm 132/68  51 (pulse) 07/10/19 7:20am 119/67  51   8:15am 172/87  55 07/11/19 7:20pm 115/70  57 07/12/19 8:00pm 123/67  57 07/13/19 9:00am 114/65  53   8:30pm 177/85  55 07/14/19 8:00am   85/65  94   8:10am  111/74 96   9:45am    99/75 94              8:50pm  103/73 90 07/15/19 7:20am  125/69 47

## 2019-07-18 ENCOUNTER — Encounter: Payer: Self-pay | Admitting: Nurse Practitioner

## 2019-07-18 ENCOUNTER — Ambulatory Visit (INDEPENDENT_AMBULATORY_CARE_PROVIDER_SITE_OTHER): Payer: Medicare Other | Admitting: Nurse Practitioner

## 2019-07-18 ENCOUNTER — Other Ambulatory Visit: Payer: Self-pay

## 2019-07-18 VITALS — BP 130/82 | HR 57 | Temp 97.3°F | Wt 109.6 lb

## 2019-07-18 DIAGNOSIS — F0280 Dementia in other diseases classified elsewhere without behavioral disturbance: Secondary | ICD-10-CM | POA: Diagnosis not present

## 2019-07-18 DIAGNOSIS — I10 Essential (primary) hypertension: Secondary | ICD-10-CM

## 2019-07-18 DIAGNOSIS — G301 Alzheimer's disease with late onset: Secondary | ICD-10-CM

## 2019-07-18 MED ORDER — NEBIVOLOL HCL 5 MG PO TABS: 5.0000 mg | ORAL_TABLET | Freq: Every day | ORAL | 1 refills | Status: DC

## 2019-07-18 NOTE — Progress Notes (Signed)
Careteam: Patient Care Team: Gayland Curry, DO as PCP - General (Geriatric Medicine) Minus Breeding, MD as Consulting Physician (Cardiology) Stark Klein, MD as Consulting Physician (General Surgery) Netta Cedars, MD as Consulting Physician (Orthopedic Surgery) Richmond Campbell, MD as Consulting Physician (Gastroenterology) Nat Christen, MD as Attending Physician (Optometry) Hortencia Pilar, MD as Consulting Physician (Surgery) Madelon Lips, MD as Consulting Physician (Nephrology)  PLACE OF SERVICE:  [place] Directive information Does Patient Have a Medical Advance Directive?: Yes, Type of Advance Directive: Living will;Healthcare Power of Attorney, Does patient want to make changes to medical advance directive?: No - Patient declined  Allergies  Allergen Reactions  . Penicillins Nausea And Vomiting and Other (See Comments)    Did it involve swelling of the face/tongue/throat, SOB, or low BP? Unknown Did it involve sudden or severe rash/hives, skin peeling, or any reaction on the inside of your mouth or nose? Unknown Did you need to seek medical attention at a hospital or doctor's office? Unknown When did it last happen?unk If all above answers are "NO", may proceed with cephalosporin use.      Chief Complaint  Patient presents with  . Acute Visit    Need Fl2 form filled out/ questions about amlodipine and bystolic medications      HPI: Patient is a 86 y.o. female for questions about blood pressure and FL2  There was no refills on amlodipine so they did not change bystolic. She is current on systolic 5 mg and no amlodipine.    HR ranging from 47-90 mostly in the 50s  Legs are not keeping up with her, sometimes she get get up and wash windows and clean floors sometimes it is hard to walk.     Review of Systems:  Review of Systems  Unable to perform ROS: Dementia    Past Medical History:  Diagnosis Date  . Acute bronchitis    . Alzheimer disease ([place])   . Anemia   . Benign neoplasm of uterus, part unspecified   . Chronic ischemic heart disease, unspecified   . Chronic kidney disease, stage II (mild)   . Cough   . Encounter for long-term (current) use of other medications   . Fall 01/30/2016  . Ganglion, unspecified   . Hearing loss   . Heart murmur   . Hyperlipidemia   . Hypertension   . Impacted cerumen   . Light-headed feeling 09/25/2015  . Macular degeneration (senile) of retina, unspecified   . Mitral valve disorders(424.0)   . Nonspecific abnormal electrocardiogram (ECG) (EKG)   . Osteoporosis, unspecified   . Other abnormal blood chemistry   . Other emphysema ([place])   . Other malaise and fatigue   . Palpitations   . Senile cataract, unspecified   . Senile purpura ([place]) 06/06/2015  . Thrombocytopenia, unspecified ([place])   . Unspecified hearing loss    Past Surgical History:  Procedure Laterality Date  . BIOPSY  05/30/2018   Procedure: BIOPSY;  Surgeon: Lavena Bullion, DO;  Location: [facility];  Service: Gastroenterology;;  . ESOPHAGOGASTRODUODENOSCOPY (EGD) WITH PROPOFOL N/A 05/30/2018   Procedure: ESOPHAGOGASTRODUODENOSCOPY (EGD) WITH PROPOFOL;  Surgeon: Lavena Bullion, DO;  Location: [facility];  Service: Gastroenterology;  Laterality: N/A;  . KNEE ARTHROSCOPY     right   Social History:   reports that she has never smoked. She has never used smokeless tobacco. She reports that she does not drink alcohol and does not use drugs.  Family History  Problem Relation Age of Onset  . Stroke Brother   . Cancer Sister        lung  . Parkinsonism Father        Alzheimer's  . Colon cancer Neg Hx   . Esophageal cancer Neg Hx   . Rectal cancer Neg Hx   . Stomach cancer Neg Hx     Medications: Patient's Medications  New Prescriptions   No medications on file  Previous Medications   ACETAMINOPHEN (TYLENOL PO)    Take by mouth as needed.   AMLODIPINE (NORVASC) 2.5 MG TABLET    Take  2 tablets (5 mg total) by mouth daily.   ATORVASTATIN (LIPITOR) 10 MG TABLET    TAKE 1 TABLET ONCE DAILY TO LOWER CHOLESTEROL.   CHOLECALCIFEROL (VITAMIN D3) 50 MCG (2000 UT) CAPSULE    Take 1 capsule (2,000 Units total) by mouth daily.   DONEPEZIL (ARICEPT) 10 MG TABLET    TAKE ONE TABLET AT BEDTIME.   ENSURE (ENSURE)    Take 1 Can by mouth daily.   MEMANTINE (NAMENDA XR) 28 MG CP24 24 HR CAPSULE    TAKE 1 CAPSULE DAILY.   NEBIVOLOL (BYSTOLIC) 2.5 MG TABLET    Take 1 tablet (2.5 mg total) by mouth daily.   NITROFURANTOIN, MACROCRYSTAL-MONOHYDRATE, (MACROBID) 100 MG CAPSULE    Take 1 capsule (100 mg total) by mouth 2 (two) times daily.   PRENATAL VIT-FE FUMARATE-FA (PRENATAL PO)    Take 1 tablet by mouth daily.   SERTRALINE (ZOLOFT) 25 MG TABLET    Take 1 tablet (25 mg total) by mouth daily.   SPECIALTY VITAMINS PRODUCTS (LIPOTRIAD VISION SUPPORT PO)    Take 1 tablet by mouth daily.  Modified Medications   No medications on file  Discontinued Medications   ASPIRIN EC 81 MG TABLET    Take 81 mg by mouth daily.    Physical Exam:  Vitals:   07/18/19 1337  BP: 130/82  Pulse: (!) 57  Temp: (!) 97.3 F (36.3 C)  SpO2: 97%  Weight: 109 lb 9.6 oz (49.7 kg)   Body mass index is 18.81 kg/m. Wt Readings from Last 3 Encounters:  07/18/19 109 lb 9.6 oz (49.7 kg)  07/06/19 109 lb (49.4 kg)  06/30/19 107 lb 12.8 oz (48.9 kg)    Physical Exam Constitutional:      General: She is not in acute distress.    Appearance: She is well-developed. She is not diaphoretic.  HENT:     Head: Normocephalic and atraumatic.  Eyes:     Conjunctiva/sclera: Conjunctivae normal.     Pupils: Pupils are equal, round, and reactive to light.  Cardiovascular:     Rate and Rhythm: Normal rate and regular rhythm.     Heart sounds: Normal heart sounds.  Pulmonary:     Effort: Pulmonary effort is normal.     Breath sounds: Normal breath sounds.  Abdominal:     General: Bowel sounds are normal.      Palpations: Abdomen is soft.  Musculoskeletal:        General: No tenderness.     Cervical back: Normal range of motion and neck supple.  Skin:    General: Skin is warm and dry.  Neurological:     Mental Status: She is alert. Mental status is at baseline.  Psychiatric:        Cognition and Memory: Cognition is impaired. Memory is impaired. She exhibits impaired recent memory.    Labs reviewed: Basic Metabolic  Panel: Recent Labs    05/25/19 1427  NA 140  K 4.6  CL 108  CO2 23  GLUCOSE 109  BUN 37*  CREATININE 2.03*  CALCIUM 8.6   Liver Function Tests: Recent Labs    05/25/19 1427  AST 12  ALT 9  BILITOT 0.5  PROT 6.0*   No results for input(s): LIPASE, AMYLASE in the last 8760 hours. No results for input(s): AMMONIA in the last 8760 hours. CBC: Recent Labs    12/09/18 0938 03/08/19 1050 05/25/19 1427  WBC 5.0 5.5 9.5  NEUTROABS 3.5 3.6 7,581  HGB 12.8 13.6 11.3*  HCT 39.5 41.7 33.6*  MCV 97.4 98.9 96.8  PLT 133.0* 120.0* 124*   Lipid Panel: No results for input(s): CHOL, HDL, LDLCALC, TRIG, CHOLHDL, LDLDIRECT in the last 8760 hours. TSH: No results for input(s): TSH in the last 8760 hours. A1C: Lab Results  Component Value Date   HGBA1C 5.5 03/22/2018     Assessment/Plan 1. Hypertension, unspecified type Ran out of amlodipine so did not cut back on bystolic, blood pressure have been stable over the last 4 days sbp in the 140s -will contine Bystolic 5 mg daily and have family cont to monitor.  -HR generally in the 50s but this has been ongoing and stable. - nebivolol (BYSTOLIC) 5 MG tablet; Take 1 tablet (5 mg total) by mouth daily.  Dispense: 90 tablet; Refill: 1  2. Late onset Alzheimer's disease without behavioral disturbance ([place]) Progressive disease, she will be moving to assisted living/memory care until. Fl2 completed in office with daughter.   Next appt: 08/22/2019 as scheduled with Dr Sharee Holster K. [place], [facility] [phone]

## 2019-07-19 ENCOUNTER — Other Ambulatory Visit: Payer: Self-pay

## 2019-07-19 ENCOUNTER — Telehealth: Payer: Self-pay

## 2019-07-19 DIAGNOSIS — Z111 Encounter for screening for respiratory tuberculosis: Secondary | ICD-10-CM

## 2019-07-19 NOTE — Telephone Encounter (Signed)
Document received and Sherrie Mustache  NP have filled them out.

## 2019-07-20 ENCOUNTER — Other Ambulatory Visit: Payer: Medicare Other

## 2019-07-20 ENCOUNTER — Other Ambulatory Visit: Payer: Self-pay

## 2019-07-20 DIAGNOSIS — K259 Gastric ulcer, unspecified as acute or chronic, without hemorrhage or perforation: Secondary | ICD-10-CM

## 2019-07-20 DIAGNOSIS — D62 Acute posthemorrhagic anemia: Secondary | ICD-10-CM

## 2019-07-20 DIAGNOSIS — K279 Peptic ulcer, site unspecified, unspecified as acute or chronic, without hemorrhage or perforation: Secondary | ICD-10-CM

## 2019-07-20 DIAGNOSIS — R748 Abnormal levels of other serum enzymes: Secondary | ICD-10-CM

## 2019-07-20 DIAGNOSIS — K2970 Gastritis, unspecified, without bleeding: Secondary | ICD-10-CM

## 2019-07-20 DIAGNOSIS — Z111 Encounter for screening for respiratory tuberculosis: Secondary | ICD-10-CM

## 2019-07-20 DIAGNOSIS — F0280 Dementia in other diseases classified elsewhere without behavioral disturbance: Secondary | ICD-10-CM

## 2019-07-21 ENCOUNTER — Telehealth: Payer: Self-pay

## 2019-07-21 NOTE — Progress Notes (Signed)
Anemia resolved. Iron panel in normal range now. Has been taking prenatal vitamin with iron.   Will discuss at visit

## 2019-07-21 NOTE — Progress Notes (Signed)
Ok to stop the prenatal vitamin. Please remove from her med list and let [name] know.

## 2019-07-21 NOTE — Telephone Encounter (Signed)
Late Entry  [name] returned call around 2:35 pm to discuss labs and also mentioned that she needs paperwork for [facility] sent to facility today for they have a meeting with the facility at 5:00 pm.  I informed [name] that the paperwork was incomplete for we are still awaiting TB lab test rests (refer to recent labs)  I called that facility and spoke with Lovey Newcomer, I advised Lovey Newcomer that I needed to fa some time sensitive information in order for a patient to move.  Lovey Newcomer provided me with a fax number of [phone] and I faxed at 3:01 pm. I did not receive an error message which is an indication that fax went though

## 2019-07-21 NOTE — Telephone Encounter (Signed)
Incoming call received from [facility] with [facility] indicating patient was discharged from home health PT on 07/15/2019. Patient has started with outpatient PT for arm   FYI

## 2019-07-22 ENCOUNTER — Telehealth: Payer: Self-pay

## 2019-07-22 ENCOUNTER — Other Ambulatory Visit: Payer: Self-pay

## 2019-07-22 LAB — QUANTIFERON-TB GOLD PLUS
Mitogen-NIL: 8.33 IU/mL
NIL: 0.04 IU/mL
QuantiFERON-TB Gold Plus: NEGATIVE
TB1-NIL: 0.01 IU/mL
TB2-NIL: 0 IU/mL

## 2019-07-22 LAB — CBC WITH DIFFERENTIAL/PLATELET
Absolute Monocytes: 643 cells/uL (ref 200–950)
Basophils Absolute: 59 cells/uL (ref 0–200)
Basophils Relative: 1 %
Eosinophils Absolute: 59 cells/uL (ref 15–500)
Eosinophils Relative: 1 %
HCT: 39.8 % (ref 35.0–45.0)
Hemoglobin: 13.3 g/dL (ref 11.7–15.5)
Lymphs Abs: 1151 cells/uL (ref 850–3900)
MCH: 33.1 pg — ABNORMAL HIGH (ref 27.0–33.0)
MCHC: 33.4 g/dL (ref 32.0–36.0)
MCV: 99 fL (ref 80.0–100.0)
MPV: 11.5 fL (ref 7.5–12.5)
Monocytes Relative: 10.9 %
Neutro Abs: 3988 cells/uL (ref 1500–7800)
Neutrophils Relative %: 67.6 %
Platelets: 145 10*3/uL (ref 140–400)
RBC: 4.02 10*6/uL (ref 3.80–5.10)
RDW: 11.8 % (ref 11.0–15.0)
Total Lymphocyte: 19.5 %
WBC: 5.9 10*3/uL (ref 3.8–10.8)

## 2019-07-22 LAB — IRON, TOTAL/TOTAL IRON BINDING CAP
%SAT: 29 % (calc) (ref 16–45)
Iron: 103 ug/dL (ref 45–160)
TIBC: 350 mcg/dL (calc) (ref 250–450)

## 2019-07-22 NOTE — Telephone Encounter (Signed)
She does have them but it will take at least 2 days to get results back. I will let him know this. Called greg back and he said he will possibly look into getting done at one of the pharmacy

## 2019-07-22 NOTE — Telephone Encounter (Signed)
Please confirm with Hassan Rowan if the test [facility] has in the office is covid PCR.  If not, I recommend they get tested through [facility]

## 2019-07-22 NOTE — Telephone Encounter (Signed)
[name] patient's son, called wanting to know if we do PCR testing. They are trying to get her moved in to [facility], but need PCR testing done 24 hours prior to move in. They do not have a move in date as of now. Please advise

## 2019-07-24 NOTE — Progress Notes (Signed)
TB test was negative.

## 2019-07-29 ENCOUNTER — Other Ambulatory Visit: Payer: Self-pay | Admitting: Internal Medicine

## 2019-08-03 ENCOUNTER — Other Ambulatory Visit: Payer: Self-pay

## 2019-08-03 ENCOUNTER — Ambulatory Visit (INDEPENDENT_AMBULATORY_CARE_PROVIDER_SITE_OTHER): Payer: Medicare Other | Admitting: Adult Health

## 2019-08-03 ENCOUNTER — Encounter: Payer: Self-pay | Admitting: Adult Health

## 2019-08-03 VITALS — BP 130/82 | HR 56 | Temp 97.1°F | Ht 64.0 in | Wt 108.4 lb

## 2019-08-03 DIAGNOSIS — R451 Restlessness and agitation: Secondary | ICD-10-CM | POA: Diagnosis not present

## 2019-08-03 DIAGNOSIS — N184 Chronic kidney disease, stage 4 (severe): Secondary | ICD-10-CM

## 2019-08-03 DIAGNOSIS — N3000 Acute cystitis without hematuria: Secondary | ICD-10-CM | POA: Diagnosis not present

## 2019-08-03 LAB — POCT URINALYSIS DIPSTICK
Bilirubin, UA: NEGATIVE
Blood, UA: NEGATIVE
Glucose, UA: NEGATIVE
Ketones, UA: NEGATIVE
Nitrite, UA: NEGATIVE
Protein, UA: POSITIVE — AB
Spec Grav, UA: 1.01 (ref 1.010–1.025)
Urobilinogen, UA: 0.2 E.U./dL
pH, UA: 7.5 (ref 5.0–8.0)

## 2019-08-03 MED ORDER — CIPROFLOXACIN HCL 500 MG PO TABS: 500.0000 mg | ORAL_TABLET | Freq: Every day | ORAL | 0 refills | Status: DC

## 2019-08-03 NOTE — Progress Notes (Signed)
[facility] clinic  Provider:  Durenda Age - DNP  Code Status:   DNR  Goals of Care:  Advanced Directives 08/03/2019  Does Patient Have a Medical Advance Directive? Yes  Type of Paramedic of [place];Living will  Does patient want to make changes to medical advance directive? No - Patient declined  Copy of [place] in Chart? Yes - validated most recent copy scanned in chart (See row information)     Chief Complaint  Patient presents with  . Acute Visit    Family complains of agitation and aggressive behavior. Is awaiting placement in facility.     HPI: Patient is a 86 y.o. female seen today for an acute visit for aggression towards the caregiver, verbal aggression. She has a diagnosis of Alzheimer's dementia. She came to the clinic with daughter. She has a caregiver that stays with her from 9 AM to 5PM. This aggression just happened yesterday and her children wants to know if this is due to UTI. Daughter, also, doesn't know if this is a personality issue. She has 4 children who takes turn taking care of her. They are in the process of moving her to a facility. Urinalysis showed moderate leukocytes, trace ketones, negative nitrites, wbc 0-5 and RBC 0-2.   Past Medical History:  Diagnosis Date  . Acute bronchitis   . Alzheimer disease ([place])   . Anemia   . Benign neoplasm of uterus, part unspecified   . Chronic ischemic heart disease, unspecified   . Chronic kidney disease, stage II (mild)   . Cough   . Encounter for long-term (current) use of other medications   . Fall 01/30/2016  . Ganglion, unspecified   . Hearing loss   . Heart murmur   . Hyperlipidemia   . Hypertension   . Impacted cerumen   . Light-headed feeling 09/25/2015  . Macular degeneration (senile) of retina, unspecified   . Mitral valve disorders(424.0)   . Nonspecific abnormal electrocardiogram (ECG) (EKG)   . Osteoporosis, unspecified   . Other abnormal blood  chemistry   . Other emphysema ([place])   . Other malaise and fatigue   . Palpitations   . Senile cataract, unspecified   . Senile purpura ([place]) 06/06/2015  . Thrombocytopenia, unspecified ([place])   . Unspecified hearing loss     Past Surgical History:  Procedure Laterality Date  . BIOPSY  05/30/2018   Procedure: BIOPSY;  Surgeon: Lavena Bullion, DO;  Location: [facility];  Service: Gastroenterology;;  . ESOPHAGOGASTRODUODENOSCOPY (EGD) WITH PROPOFOL N/A 05/30/2018   Procedure: ESOPHAGOGASTRODUODENOSCOPY (EGD) WITH PROPOFOL;  Surgeon: Lavena Bullion, DO;  Location: [facility];  Service: Gastroenterology;  Laterality: N/A;  . KNEE ARTHROSCOPY     right    Allergies  Allergen Reactions  . Penicillins Nausea And Vomiting and Other (See Comments)    Did it involve swelling of the face/tongue/throat, SOB, or low BP? Unknown Did it involve sudden or severe rash/hives, skin peeling, or any reaction on the inside of your mouth or nose? Unknown Did you need to seek medical attention at a hospital or doctor's office? Unknown When did it last happen?unk If all above answers are "NO", may proceed with cephalosporin use.      Outpatient Encounter Medications as of 08/03/2019  Medication Sig  . Acetaminophen (TYLENOL PO) Take by mouth as needed.  Marland Kitchen atorvastatin (LIPITOR) 10 MG tablet TAKE 1 TABLET ONCE DAILY TO LOWER CHOLESTEROL.  . Cholecalciferol (VITAMIN D3) 50 MCG (  2000 UT) capsule Take 1 capsule (2,000 Units total) by mouth daily.  Marland Kitchen donepezil (ARICEPT) 10 MG tablet TAKE ONE TABLET AT BEDTIME.  Marland Kitchen Ensure (ENSURE) Take 1 Can by mouth daily.  . memantine (NAMENDA XR) 28 MG CP24 24 hr capsule TAKE 1 CAPSULE DAILY.  . nebivolol (BYSTOLIC) 5 MG tablet Take 1 tablet (5 mg total) by mouth daily.  . nitrofurantoin, macrocrystal-monohydrate, (MACROBID) 100 MG capsule Take 1 capsule (100 mg total) by mouth 2 (two) times daily.  . sertraline (ZOLOFT) 25 MG tablet Take 1 tablet (25 mg  total) by mouth daily.  Marland Kitchen Specialty Vitamins Products (LIPOTRIAD VISION SUPPORT PO) Take 1 tablet by mouth daily.   No facility-administered encounter medications on file as of 08/03/2019.    Review of Systems:  Review of Systems  Constitutional: Negative for activity change, appetite change, chills and fever.  HENT: Positive for hearing loss. Negative for congestion and ear discharge.   Eyes: Negative for discharge.  Respiratory: Negative for cough, shortness of breath and wheezing.   Cardiovascular: Negative for chest pain and leg swelling.  Gastrointestinal: Negative for abdominal pain, nausea and vomiting.  Genitourinary: Negative for difficulty urinating.  Psychiatric/Behavioral: Positive for agitation.    Health Maintenance  Topic Date Due  . MAMMOGRAM  11/22/2017  . INFLUENZA VACCINE  08/21/2019  . TETANUS/TDAP  06/22/2024  . DEXA SCAN  Completed  . COVID-19 Vaccine  Completed  . PNA vac Low Risk Adult  Completed    Physical Exam: Vitals:   08/03/19 1348  BP: 130/82  Pulse: (!) 56  Temp: (!) 97.1 F (36.2 C)  TempSrc: Oral  SpO2: 97%  Weight: 108 lb 6.4 oz (49.2 kg)  Height: 5\' 4"  (1.626 m)   Body mass index is 18.61 kg/m. Physical Exam Constitutional:      Appearance: Normal appearance. She is normal weight.  HENT:     Head: Normocephalic.     Nose: Nose normal.     Mouth/Throat:     Mouth: Mucous membranes are moist.  Cardiovascular:     Rate and Rhythm: Normal rate and regular rhythm.     Pulses: Normal pulses.     Heart sounds: Normal heart sounds. No murmur heard.   Pulmonary:     Effort: Pulmonary effort is normal.     Breath sounds: Normal breath sounds.  Abdominal:     General: Abdomen is flat. There is no distension.     Palpations: Abdomen is soft.     Tenderness: There is no right CVA tenderness or left CVA tenderness.  Musculoskeletal:     Cervical back: Normal range of motion.  Skin:    General: Skin is warm and dry.  Neurological:       General: No focal deficit present.     Mental Status: She is alert. Mental status is at baseline.  Psychiatric:        Mood and Affect: Mood normal.     Labs reviewed: Basic Metabolic Panel: Recent Labs    05/25/19 1427  NA 140  K 4.6  CL 108  CO2 23  GLUCOSE 109  BUN 37*  CREATININE 2.03*  CALCIUM 8.6   Liver Function Tests: Recent Labs    05/25/19 1427  AST 12  ALT 9  BILITOT 0.5  PROT 6.0*   CBC: Recent Labs    03/08/19 1050 05/25/19 1427 07/20/19 0939  WBC 5.5 9.5 5.9  NEUTROABS 3.6 7,581 3,988  HGB 13.6 11.3* 13.3  HCT 41.7  33.6* 39.8  MCV 98.9 96.8 99.0  PLT 120.0* 124* 145    Lab Results  Component Value Date   HGBA1C 5.5 03/22/2018     Assessment/Plan 1. Agitation  -  No noted agitation during the entire visit -  She is hard of hearing and left hearing aid/broken at home -   Encouraged to wear hearing aids  2.  Acute cystitis without hematuria -   Urinalysis has moderate leukocytes  -    Will start on Cipro 500 mg daily X 7 days    3.  Chronic kidney disease (CKD) stage 4 (severe) (HCC) Lab Results  Component Value Date   CREATININE 2.03 (H) 05/25/2019    GFR 22 -  Renally dosed Cipro      Labs/tests ordered:  - POC Urinalysis - Glucose/Protein and urine culture  Next appt:  08/22/2019

## 2019-08-03 NOTE — Patient Instructions (Signed)
Acute Urinary Retention, Female  Acute urinary retention means that you cannot pee (urinate) at all, or that you pee too little and your bladder is not emptied completely. If it is not treated, it can lead to kidney damage or other serious problems. Follow these instructions at home:  Take over-the-counter and prescription medicines only as told by your doctor. Ask your doctor what medicines you should stay away from. Do not take any medicine unless your doctor says it is okay to do so.  If you were sent home with a tube that drains pee from the bladder (catheter), take care of it as told by your doctor.  Drink enough fluid to keep your pee clear or pale yellow.  If you were given an antibiotic, take it as told by your doctor. Do not stop taking the antibiotic even if you start to feel better.  Do not use any products that contain nicotine or tobacco, such as cigarettes and e-cigarettes. If you need help quitting, ask your doctor.  Watch for changes in your symptoms. Tell your doctor about them.  If told, keep track of any changes in your blood pressure at home. Tell your doctor about them.  Keep all follow-up visits as told by your doctor. This is important. Contact a doctor if:  You have spasms or you leak pee when you have spasms. Get help right away if:  You have chills or a fever.  You have blood in your pee.  You have a tube that drains the bladder and: ? The tube stops draining pee. ? The tube falls out. Summary  Acute urinary retention means that you cannot pee at all, or that you pee too little and your bladder is not emptied completely. If it is not treated, it can result in kidney damage or other serious problems.  If you were sent home with a tube that drains pee from the bladder, take care of it as told by your doctor.  Pay attention to any changes in your symptoms. Tell your doctor about them. This information is not intended to replace advice given to you by your  health care provider. Make sure you discuss any questions you have with your health care provider. Document Revised: 12/19/2016 Document Reviewed: 02/08/2016 Elsevier Patient Education  2020 Elsevier Inc.  

## 2019-08-05 ENCOUNTER — Encounter: Payer: Self-pay | Admitting: Internal Medicine

## 2019-08-06 NOTE — Progress Notes (Signed)
Result does not need further antibiotics antibiotics

## 2019-08-07 LAB — URINALYSIS W MICROSCOPIC + REFLEX CULTURE
Bacteria, UA: NONE SEEN /HPF
Bilirubin Urine: NEGATIVE
Glucose, UA: NEGATIVE
Hgb urine dipstick: NEGATIVE
Hyaline Cast: NONE SEEN /LPF
Nitrites, Initial: NEGATIVE
Protein, ur: NEGATIVE
Specific Gravity, Urine: 1.015 (ref 1.001–1.03)
Squamous Epithelial / HPF: NONE SEEN /HPF (ref <=5)
pH: 6 (ref 5.0–8.0)

## 2019-08-07 LAB — URINE CULTURE
MICRO NUMBER:: 10708566
SPECIMEN QUALITY:: ADEQUATE

## 2019-08-07 LAB — CULTURE INDICATED

## 2019-08-10 ENCOUNTER — Encounter: Payer: Self-pay | Admitting: Internal Medicine

## 2019-08-10 ENCOUNTER — Telehealth: Payer: Self-pay

## 2019-08-10 DIAGNOSIS — F419 Anxiety disorder, unspecified: Secondary | ICD-10-CM

## 2019-08-10 NOTE — Telephone Encounter (Signed)
Tad Moore, memory care coordinator with [facility] would like to discuss medications for Ms. Ballweg. He stated that she is having trouble getting use to them and adjusting to being in a memory care unit. Would like to speak with you or your assistant.He left this message on the voicemail. His number is [phone]. Please advise.

## 2019-08-10 NOTE — Telephone Encounter (Signed)
Please obtain the details about her medication problems:  which meds?  Is she not taking them?  Not able to swallow them?  What behavioral concerns?  And I will respond so you can call him back with answers since I'm just getting to see this at 6pm after clinic and rounds.  Thanks.

## 2019-08-11 ENCOUNTER — Telehealth: Payer: Self-pay

## 2019-08-11 DIAGNOSIS — F419 Anxiety disorder, unspecified: Secondary | ICD-10-CM

## 2019-08-11 NOTE — Telephone Encounter (Signed)
I can send in some low dose ativan as requested.  Which pharmacy should it be sent to?

## 2019-08-11 NOTE — Telephone Encounter (Signed)
Legrand Como, [facility], [phone]), stated patient is having difficulty adjusting to the facility. He is requesting something for anxiety.

## 2019-08-11 NOTE — Telephone Encounter (Signed)
Please see messages from yesterday, also.  Increase zoloft to 50mg  from 25mg  daily to address anxiety.

## 2019-08-11 NOTE — Telephone Encounter (Signed)
I called and spoke with Lindsey Salazar this morning, he indicated that he had spoke with Lindsey Salazar and gave her a lot of details. He told me that Lindsey Salazar is having a rough time. He stated that her short term memory is very, very short (less than 5 minutes). She has been following the staff around <asking about when is she going home. When she doesn't get her way or they can't answer at the moment she gets agitated and starts stomping and screaming. He would like to know if you could maybe do a PRN ativan if you and the son agree. Maybe a low dose of 0.25mg  for now.

## 2019-08-12 MED ORDER — LORAZEPAM 0.5 MG PO TABS: 0.2500 mg | ORAL_TABLET | Freq: Every day | ORAL | 0 refills | Status: AC | PRN

## 2019-08-12 MED ORDER — SERTRALINE HCL 50 MG PO TABS: 50.0000 mg | ORAL_TABLET | Freq: Every day | ORAL | 3 refills | Status: DC

## 2019-08-12 NOTE — Telephone Encounter (Signed)
Legrand Como with [facility] Notified and agreed. Faxed Rx for new dosage to pharmacy. [facility] Fax: [phone]

## 2019-08-12 NOTE — Addendum Note (Signed)
Addended by: Rafael Bihari A on: 08/12/2019 01:03 PM   Modules accepted: Orders

## 2019-08-12 NOTE — Telephone Encounter (Signed)
Lindsey Salazar with [facility] [phone] called and stated that he hasn't heard anything back and needed something before the weekend.   Message given to him and agreed. Faxed Rx to [facility] Fax: [phone]

## 2019-08-22 ENCOUNTER — Ambulatory Visit: Payer: Medicare Other | Admitting: Internal Medicine

## 2019-08-22 ENCOUNTER — Telehealth: Payer: Self-pay

## 2019-08-22 ENCOUNTER — Other Ambulatory Visit: Payer: Self-pay | Admitting: Gastroenterology

## 2019-08-22 DIAGNOSIS — D62 Acute posthemorrhagic anemia: Secondary | ICD-10-CM

## 2019-08-22 NOTE — Telephone Encounter (Signed)
-----   Message from Angie Fava, LPN sent at 0/03/7046 11:19 AM EST ----- CBC Ferritin in August

## 2019-08-22 NOTE — Telephone Encounter (Signed)
Left patient a voicemail; that she has labs due this month. Will also mail out a letter.

## 2019-09-22 ENCOUNTER — Encounter: Payer: Self-pay | Admitting: Internal Medicine

## 2019-09-28 ENCOUNTER — Telehealth: Payer: Self-pay

## 2019-09-28 NOTE — Telephone Encounter (Signed)
Noted  

## 2019-09-28 NOTE — Telephone Encounter (Signed)
[facility] called to let us know that patient sustained a skin tear on her left arm. Staff was unaware of when/where it happened, it was noticed after patient's shower and treated.

## 2019-10-10 ENCOUNTER — Encounter: Payer: Self-pay | Admitting: Internal Medicine

## 2019-10-21 ENCOUNTER — Encounter (HOSPITAL_COMMUNITY): Payer: Self-pay | Admitting: Emergency Medicine

## 2019-10-21 ENCOUNTER — Emergency Department (HOSPITAL_COMMUNITY)
Admission: EM | Admit: 2019-10-21 | Discharge: 2019-10-22 | Disposition: A | Discharge status: Home / Self Care | Payer: Medicare Other | Attending: Emergency Medicine | Admitting: Emergency Medicine

## 2019-10-21 ENCOUNTER — Emergency Department (HOSPITAL_COMMUNITY): Payer: Medicare Other

## 2019-10-21 ENCOUNTER — Telehealth: Payer: Self-pay | Admitting: Adult Health

## 2019-10-21 DIAGNOSIS — I129 Hypertensive chronic kidney disease with stage 1 through stage 4 chronic kidney disease, or unspecified chronic kidney disease: Secondary | ICD-10-CM | POA: Diagnosis not present

## 2019-10-21 DIAGNOSIS — N184 Chronic kidney disease, stage 4 (severe): Secondary | ICD-10-CM | POA: Diagnosis not present

## 2019-10-21 DIAGNOSIS — I10 Essential (primary) hypertension: Secondary | ICD-10-CM

## 2019-10-21 DIAGNOSIS — Z79899 Other long term (current) drug therapy: Secondary | ICD-10-CM | POA: Diagnosis not present

## 2019-10-21 DIAGNOSIS — H6121 Impacted cerumen, right ear: Secondary | ICD-10-CM | POA: Insufficient documentation

## 2019-10-21 DIAGNOSIS — F0390 Unspecified dementia without behavioral disturbance: Secondary | ICD-10-CM | POA: Insufficient documentation

## 2019-10-21 DIAGNOSIS — Z8542 Personal history of malignant neoplasm of other parts of uterus: Secondary | ICD-10-CM | POA: Insufficient documentation

## 2019-10-21 DIAGNOSIS — R42 Dizziness and giddiness: Secondary | ICD-10-CM | POA: Diagnosis present

## 2019-10-21 LAB — BASIC METABOLIC PANEL
Anion gap: 10 (ref 5–15)
BUN: 36 mg/dL — ABNORMAL HIGH (ref 8–23)
CO2: 24 mmol/L (ref 22–32)
Calcium: 9.3 mg/dL (ref 8.9–10.3)
Chloride: 106 mmol/L (ref 98–111)
Creatinine, Ser: 1.99 mg/dL — ABNORMAL HIGH (ref 0.44–1.00)
GFR calc Af Amer: 26 mL/min — ABNORMAL LOW (ref >60)
GFR calc non Af Amer: 22 mL/min — ABNORMAL LOW (ref >60)
Glucose, Bld: 93 mg/dL (ref 70–99)
Potassium: 4.5 mmol/L (ref 3.5–5.1)
Sodium: 140 mmol/L (ref 135–145)

## 2019-10-21 LAB — URINALYSIS, ROUTINE W REFLEX MICROSCOPIC
Bilirubin Urine: NEGATIVE
Glucose, UA: NEGATIVE mg/dL
Hgb urine dipstick: NEGATIVE
Ketones, ur: NEGATIVE mg/dL
Nitrite: NEGATIVE
Protein, ur: NEGATIVE mg/dL
Specific Gravity, Urine: 1.013 (ref 1.005–1.030)
pH: 5 (ref 5.0–8.0)

## 2019-10-21 LAB — CBC
HCT: 42.3 % (ref 36.0–46.0)
Hemoglobin: 13 g/dL (ref 12.0–15.0)
MCH: 30.7 pg (ref 26.0–34.0)
MCHC: 30.7 g/dL (ref 30.0–36.0)
MCV: 99.8 fL (ref 80.0–100.0)
Platelets: 140 10*3/uL — ABNORMAL LOW (ref 150–400)
RBC: 4.24 MIL/uL (ref 3.87–5.11)
RDW: 13.2 % (ref 11.5–15.5)
WBC: 5.8 10*3/uL (ref 4.0–10.5)
nRBC: 0 % (ref 0.0–0.2)

## 2019-10-21 MED ORDER — HYDRALAZINE HCL 20 MG/ML IJ SOLN
10.0000 mg | Freq: Once | INTRAMUSCULAR | Status: AC
  Administered 2019-10-21: 10 mg via INTRAVENOUS
  Filled 2019-10-21: qty 1

## 2019-10-21 MED ORDER — HYDRALAZINE HCL 10 MG PO TABS: 10.0000 mg | ORAL_TABLET | Freq: Three times a day (TID) | ORAL | 0 refills | Status: DC

## 2019-10-21 MED ORDER — CARBAMIDE PEROXIDE 6.5 % OT SOLN
5.0000 [drp] | Freq: Two times a day (BID) | OTIC | Status: DC
  Administered 2019-10-22: 5 [drp] via OTIC
  Filled 2019-10-21 (×2): qty 15

## 2019-10-21 NOTE — ED Notes (Signed)
CBG was taken at 2108. CBG was 86. RN notified. CBG monitor down.

## 2019-10-21 NOTE — ED Provider Notes (Signed)
[facility] EMERGENCY DEPARTMENT Provider Note   CSN: [id number] Arrival date & time: 10/21/19  1949     History Chief Complaint  Patient presents with  . Dizziness    Lindsey Salazar is a 86 y.o. female.  Pt presents to the ED today with dizziness and HTN.  Pt has a hx of dementia and does not remember feeling dizzy.  History obtained from son who is with patient.  Pt also has a hx of htn and is on Bystolic in the morning.  BPs have been elevated at the facility.  The pt's pcp was notified and wanted to try hydralazine, but they were unable to get the med at night.  Pt's son also worried about a possible UTI.  The pt has not had a fever or dysuria.  Pt said she feels fine now.        Past Medical History:  Diagnosis Date  . Acute bronchitis   . Alzheimer disease ([place])   . Anemia   . Benign neoplasm of uterus, part unspecified   . Chronic ischemic heart disease, unspecified   . Chronic kidney disease, stage II (mild)   . Cough   . Encounter for long-term (current) use of other medications   . Fall 01/30/2016  . Ganglion, unspecified   . Hearing loss   . Heart murmur   . Hyperlipidemia   . Hypertension   . Impacted cerumen   . Light-headed feeling 09/25/2015  . Macular degeneration (senile) of retina, unspecified   . Mitral valve disorders(424.0)   . Nonspecific abnormal electrocardiogram (ECG) (EKG)   . Osteoporosis, unspecified   . Other abnormal blood chemistry   . Other emphysema ([place])   . Other malaise and fatigue   . Palpitations   . Senile cataract, unspecified   . Senile purpura ([place]) 06/06/2015  . Thrombocytopenia, unspecified ([place])   . Unspecified hearing loss     Patient Active Problem List   Diagnosis Date Noted  . Gastric ulcer without hemorrhage or perforation   . Upper GI bleeding 05/29/2018  . Acute blood loss anemia 05/29/2018  . Gastrointestinal hemorrhage with melena   . Gastritis and gastroduodenitis   . Osteopenia  12/14/2017  . Adult BMI <19 kg/sq m 12/14/2017  . Underweight 12/14/2017  . Late onset Alzheimer's disease without behavioral disturbance ([place]) 10/09/2016  . Chronic kidney disease (CKD), stage IV (severe) ([place]) 10/09/2016  . Fracture, sacrum/coccyx ([place]) 01/30/2016  . Syncope 01/30/2016  . Light-headed feeling 09/25/2015  . Senile purpura ([place]) 06/06/2015  . Palpitations 05/30/2014  . Hyperglycemia 11/23/2012  . Thrombocytopenia, unspecified ([place]) 05/18/2012  . Mitral valve disorder 05/18/2012  . Senile cataract, unspecified 05/18/2012  . Abnormal EKG 04/15/2011  . HTN (hypertension) 04/15/2011  . Hyperlipidemia     Past Surgical History:  Procedure Laterality Date  . BIOPSY  05/30/2018   Procedure: BIOPSY;  Surgeon: Lavena Bullion, DO;  Location: [facility];  Service: Gastroenterology;;  . ESOPHAGOGASTRODUODENOSCOPY (EGD) WITH PROPOFOL N/A 05/30/2018   Procedure: ESOPHAGOGASTRODUODENOSCOPY (EGD) WITH PROPOFOL;  Surgeon: Lavena Bullion, DO;  Location: [facility];  Service: Gastroenterology;  Laterality: N/A;  . KNEE ARTHROSCOPY     right     OB History   No obstetric history on file.     Family History  Problem Relation Age of Onset  . Stroke Brother   . Cancer Sister        lung  . Parkinsonism Father  Alzheimer's  . Colon cancer Neg Hx   . Esophageal cancer Neg Hx   . Rectal cancer Neg Hx   . Stomach cancer Neg Hx     Social History   Tobacco Use  . Smoking status: Never Smoker  . Smokeless tobacco: Never Used  Vaping Use  . Vaping Use: Never used  Substance Use Topics  . Alcohol use: No  . Drug use: No    Home Medications Prior to Admission medications   Medication Sig Start Date End Date Taking? Authorizing Provider  acetaminophen (TYLENOL) 500 MG tablet Take 500 mg by mouth every 8 (eight) hours as needed (discomfort).   Yes [provider]  atorvastatin (LIPITOR) 10 MG tablet TAKE 1 TABLET ONCE DAILY TO LOWER  CHOLESTEROL. Patient taking differently: Take 10 mg by mouth at bedtime. To lower cholesterol 07/29/19  Yes Reed, Tiffany L, DO  Cholecalciferol (VITAMIN D3) 50 MCG (2000 UT) capsule Take 1 capsule (2,000 Units total) by mouth daily. 12/14/17  Yes Reed, Tiffany L, DO  donepezil (ARICEPT) 10 MG tablet TAKE ONE TABLET AT BEDTIME. Patient taking differently: Take 10 mg by mouth at bedtime.  06/06/19  Yes Reed, Tiffany L, DO  Ensure (ENSURE) Take 1 Can by mouth daily.   Yes [provider]  LORazepam (ATIVAN) 0.5 MG tablet Take 0.5 tablets (0.25 mg total) by mouth daily as needed for anxiety. 08/12/19  Yes Lauree Chandler, NP  memantine (NAMENDA XR) 28 MG CP24 24 hr capsule TAKE 1 CAPSULE DAILY. Patient taking differently: Take 28 mg by mouth daily.  06/06/19  Yes Reed, Tiffany L, DO  nebivolol (BYSTOLIC) 5 MG tablet Take 1 tablet (5 mg total) by mouth daily. 07/18/19  Yes Lauree Chandler, NP  sertraline (ZOLOFT) 50 MG tablet Take 50 mg by mouth daily.   Yes [provider]  hydrALAZINE (APRESOLINE) 10 MG tablet Take 1 tablet (10 mg total) by mouth 3 (three) times daily. 10/21/19   Isla Pence, MD    Allergies    Penicillins  Review of Systems   Review of Systems  Neurological: Positive for dizziness.  All other systems reviewed and are negative.   Physical Exam Updated Vital Signs BP (!) 222/95   Pulse (!) 51   Temp 98 F (36.7 C) (Oral)   Resp 18   Ht 5\' 4"  (1.626 m)   Wt 49.9 kg   SpO2 99%   BMI 18.88 kg/m   Physical Exam Vitals and nursing note reviewed.  Constitutional:      Appearance: Normal appearance.  HENT:     Head: Normocephalic and atraumatic.     Right Ear: External ear normal.     Left Ear: External ear normal.     Nose: Nose normal.     Mouth/Throat:     Mouth: Mucous membranes are moist.     Pharynx: Oropharynx is clear.  Eyes:     Extraocular Movements: Extraocular movements intact.     Conjunctiva/sclera: Conjunctivae normal.      Pupils: Pupils are equal, round, and reactive to light.  Cardiovascular:     Rate and Rhythm: Normal rate and regular rhythm.     Pulses: Normal pulses.     Heart sounds: Normal heart sounds.  Pulmonary:     Effort: Pulmonary effort is normal.     Breath sounds: Normal breath sounds.  Abdominal:     General: Abdomen is flat. Bowel sounds are normal.     Palpations: Abdomen is soft.  Musculoskeletal:        General: Normal range of motion.     Cervical back: Normal range of motion and neck supple.  Skin:    General: Skin is warm.     Capillary Refill: Capillary refill takes less than 2 seconds.  Neurological:     General: No focal deficit present.     Mental Status: She is alert. Mental status is at baseline.  Psychiatric:        Mood and Affect: Mood normal.        Behavior: Behavior normal.        Thought Content: Thought content normal.        Judgment: Judgment normal.     ED Results / Procedures / Treatments   Labs (all labs ordered are listed, but only abnormal results are displayed) Labs Reviewed  BASIC METABOLIC PANEL - Abnormal; Notable for the following components:      Result Value   BUN 36 (*)    Creatinine, Ser 1.99 (*)    GFR calc non Af Amer 22 (*)    GFR calc Af Amer 26 (*)    All other components within normal limits  CBC - Abnormal; Notable for the following components:   Platelets 140 (*)    All other components within normal limits  URINALYSIS, ROUTINE W REFLEX MICROSCOPIC - Abnormal; Notable for the following components:   Color, Urine STRAW (*)    Leukocytes,Ua TRACE (*)    Bacteria, UA RARE (*)    All other components within normal limits  CBG MONITORING, ED    EKG EKG Interpretation  Date/Time:  Friday October 21 2019 22:53:20 EDT Ventricular Rate:  59 PR Interval:  168 QRS Duration: 86 QT Interval:  424 QTC Calculation: 420 R Axis:   62 Text Interpretation: Sinus rhythm Probable left atrial enlargement ST elevation, consider inferior  injury No significant change since last tracing Confirmed by Isla Pence [id number]) on 10/21/2019 11:08:39 PM   Radiology CT Head Wo Contrast  Result Date: 10/21/2019 CLINICAL DATA:  Mental status change, unknown cause EXAM: CT HEAD WITHOUT CONTRAST TECHNIQUE: Contiguous axial images were obtained from the base of the skull through the vertex without intravenous contrast. COMPARISON:  CT 05/29/2018 FINDINGS: Brain: No evidence of acute infarction, hemorrhage, frank hydrocephalus, extra-axial collection, visible mass lesion or mass effect. Basal cisterns are patent. Lung midline intracranial structures are unremarkable. Cerebellar tonsils are normally positioned. Symmetric prominence of the ventricles, cisterns and sulci compatible with parenchymal volume loss. Patchy areas of white matter hypoattenuation are most compatible with chronic microvascular angiopathy. Vascular: Atherosclerotic calcification of the carotid siphons and intradural vertebral arteries. No hyperdense vessel. Skull: No calvarial fracture or suspicious osseous lesion. No scalp swelling or hematoma. Sinuses/Orbits: Minimal nodular mural thickening in the left maxillary sinus. Remaining paranasal sinuses and mastoid air cells are predominantly clear. Debris noted in the external auditory canals bilaterally. Middle ear cavities are clear. Senescent scleral plaques with otherwise unremarkable orbital contents as included. Other: None IMPRESSION: 1. No acute intracranial findings. 2. Generalized parenchymal volume loss and chronic microvascular angiopathy. 3. Debris in the external auditory canals, correlate for cerumen impaction. Electronically Signed   By: Lovena Le M.D.   On: 10/21/2019 21:57    Procedures Procedures (including critical care time)  Medications Ordered in ED Medications  carbamide peroxide (DEBROX) 6.5 % OTIC (EAR) solution 5 drop (has no administration in time range)  hydrALAZINE (APRESOLINE) injection 10 mg (10  mg Intravenous Given 10/21/19 2127)  ED Course  I have reviewed the triage vital signs and the nursing notes.  Pertinent labs & imaging results that were available during my care of the patient were reviewed by me and considered in my medical decision making (see chart for details).    MDM Rules/Calculators/A&P                          HR in the 50 to 60s.  Pt given 10 mg of hydralazine IV in ED.  BP down to the 180s.  Pt's right ear with some cerumen which was irrigated.  Pt is feeling well.  Pt is d/c home with oral hydralazine.  Return if worse.  Final Clinical Impression(s) / ED Diagnoses Final diagnoses:  Dizziness  Essential hypertension  Impacted cerumen of right ear    Rx / DC Orders ED Discharge Orders         Ordered    hydrALAZINE (APRESOLINE) 10 MG tablet  3 times daily        10/21/19 2238           Isla Pence, MD 10/21/19 2314

## 2019-10-21 NOTE — ED Notes (Signed)
Pt son at bedside, transported to CT

## 2019-10-21 NOTE — ED Triage Notes (Signed)
Pt brought to ED by family for repeated days of intermittent dizziness with HTN.Marland Kitchen pt is resident of [facility] [place], pt has dementia, son at bedside. Per son daughter states pt did have episode yesterday of same. Neuro at baseline. Pt denies pain. No blood thinners, no falls

## 2019-10-21 NOTE — Telephone Encounter (Signed)
I received a phone call from the nurse at [facility] regarding Lindsey Salazar. She is dizzy and her BP is 220/88 and HR 55.  The nurse is requesting an order for a UA. There is no fever or urinary symptoms at this time. The most pressing issue is her dizziness and severe HTN. I asked if we could give hydralazine but they do not have a med dispense and we would need to send a script to the pharmacy which would take several hrs.  At this time Lindsey Salazar is at risk for a stroke. I recommended that she go to the ER for evaluation unless she has a most form or goals of care indicating otherwise. The nurse will reach out to the family with my recommendations and call me back if there are other concerns.

## 2019-10-22 NOTE — ED Notes (Signed)
Patient verbalizes understanding of discharge instructions. Opportunity for questioning and answers were provided. Armband removed by staff, pt discharged from ED stable & ambulatory  

## 2019-10-24 LAB — CBG MONITORING, ED: Glucose-Capillary: 86 mg/dL (ref 70–99)

## 2019-10-24 NOTE — Telephone Encounter (Signed)
Thanks, [place].  They gave her the hydralazine in the ED.   MOST discussion was previously initiated but I don't see the form scanned in vynca so likely it never got finalized when she moved to [place].  I recommend she have a virtual appt scheduled with her son involved so we can finalize the form.  This should be with me or Janett Billow, please.

## 2019-10-25 ENCOUNTER — Encounter: Payer: Self-pay | Admitting: Family

## 2019-10-25 ENCOUNTER — Telehealth (INDEPENDENT_AMBULATORY_CARE_PROVIDER_SITE_OTHER): Payer: Medicare Other | Admitting: Family

## 2019-10-25 ENCOUNTER — Other Ambulatory Visit: Payer: Self-pay

## 2019-10-25 ENCOUNTER — Telehealth: Payer: Self-pay

## 2019-10-25 VITALS — BP 172/72

## 2019-10-25 DIAGNOSIS — I10 Essential (primary) hypertension: Secondary | ICD-10-CM | POA: Diagnosis not present

## 2019-10-25 DIAGNOSIS — G301 Alzheimer's disease with late onset: Secondary | ICD-10-CM

## 2019-10-25 DIAGNOSIS — R42 Dizziness and giddiness: Secondary | ICD-10-CM

## 2019-10-25 DIAGNOSIS — R269 Unspecified abnormalities of gait and mobility: Secondary | ICD-10-CM

## 2019-10-25 DIAGNOSIS — E782 Mixed hyperlipidemia: Secondary | ICD-10-CM | POA: Diagnosis not present

## 2019-10-25 DIAGNOSIS — H6121 Impacted cerumen, right ear: Secondary | ICD-10-CM

## 2019-10-25 DIAGNOSIS — R531 Weakness: Secondary | ICD-10-CM

## 2019-10-25 DIAGNOSIS — F0280 Dementia in other diseases classified elsewhere without behavioral disturbance: Secondary | ICD-10-CM

## 2019-10-25 MED ORDER — HYDRALAZINE HCL 10 MG PO TABS: 10.0000 mg | ORAL_TABLET | Freq: Four times a day (QID) | ORAL | 0 refills | Status: DC

## 2019-10-25 NOTE — Telephone Encounter (Signed)
[name], Lindsey Salazar are scheduled for a virtual visit with your provider today.    Just as we do with appointments in the office, we must obtain your consent to participate.  Your consent will be active for this visit and any virtual visit you may have with one of our providers in the next 365 days.    If you have a MyChart account, I can also send a copy of this consent to you electronically.  All virtual visits are billed to your insurance company just like a traditional visit in the office.  As this is a virtual visit, video technology does not allow for your provider to perform a traditional examination.  This may limit your provider's ability to fully assess your condition.  If your provider identifies any concerns that need to be evaluated in person or the need to arrange testing such as labs, EKG, etc, we will make arrangements to do so.    Although advances in technology are sophisticated, we cannot ensure that it will always work on either your end or our end.  If the connection with a video visit is poor, we may have to switch to a telephone visit.  With either a video or telephone visit, we are not always able to ensure that we have a secure connection.   I need to obtain your verbal consent now.   Are you willing to proceed with your visit today?   Lindsey Salazar has provided verbal consent on 10/25/2019 for a virtual visit (video or telephone).   Otis Peak, [place] 10/25/2019  10:44 AM

## 2019-10-25 NOTE — Patient Instructions (Signed)
-   check Blood pressure daily and record.Notify provider if B/p > 140/90  - continue PT / OT  - Order clarification for Debrox 6.5 % otic solution from ED instil 5 drops into right ear then Nurse at the facility to Lavage with warm water mixed with Hydrogen peroxide

## 2019-10-25 NOTE — Progress Notes (Addendum)
This service is provided via telemedicine  Vitals collected/recorded by Psychologist, sport and exercise on phone [place].D/RMA  Location of patient (ex: home, work): Home.  Patient consents to a telephone visit: Yes.  Location of the provider (ex: office, home):  [facility].  Name of any referring provider: Gayland Curry, DO   Names of all persons participating in the telemedicine service and their role in the encounter: Patient, Med Tech from [facility] [place] "Lindsey Salazar", Lindsey Salazar, [place], Lindsey Salazar, [place], NP.    Time spent on call: 8 minutes spent on the phone with Medical Assistant.    Location:      Place of Service:    Provider: Ashish Rossetti FNP-C  Lindsey Curry, DO  Patient Care Team: Lindsey Curry, DO as PCP - General (Geriatric Medicine) Lindsey Breeding, MD as Consulting Physician (Cardiology) Lindsey Klein, MD as Consulting Physician (General Surgery) Lindsey Cedars, MD as Consulting Physician (Orthopedic Surgery) Lindsey Campbell, MD as Consulting Physician (Gastroenterology) Lindsey Christen, MD as Attending Physician (Optometry) Lindsey Pilar, MD as Consulting Physician (Surgery) Lindsey Lips, MD as Consulting Physician (Nephrology)  Extended Emergency Contact Information Primary Emergency Contact: Lindsey Salazar Phone: [phone] Relation: Son Secondary Emergency Contact: Lindsey Salazar [place] of [place] Phone: [phone] Mobile Phone: [phone] Relation: Daughter  Code Status:  DNR Goals of care: Advanced Directive information Advanced Directives 10/25/2019  Does Patient Have a Medical Advance Directive? Yes  Type of Advance Directive Living will;Healthcare Power of Attorney  Does patient want to make changes to medical advance directive? No - Patient declined  Copy of [place] in Chart? Yes - validated most recent copy scanned in chart (See row information)     Chief Complaint  Patient presents  with  . Hospitalization Follow-up    ER for Dizziness from date 10/21/2019-10/22/2019.    HPI:  Pt is a 86 y.o. female seen today for an acute visit for follow up ED visit for dizziness on 10/21/2019 - 10/22/2019 for elevated blood pressure.readings had been high in the facility PCP had ordered hydralazine but were not able to get medication.patient's son was concerned that she had UTI so had her send to ED.she had no fever or dysuria.urine specimen in ED showed straw colored clear urine with trace leukocytes,rare bacteria  Negative for RBC and Nitrites.No urine cultures.WBC were normal CR 1.99,BUN 36 improved from previous BUN 37 and CR 2.03  She was treated with I.V hydralazine 10 mg tablet B/p went down to 180 Her right ear was also irrigated.Her condition improved and was discharged back to the facility.she was discharge with debrox otic solution for right ear.Facility Nurse states no instruction provided for debrox.she denies any acute issues today.States dizziness has resolved.She has a medical history of Hypertension,Hyperlipidemia,CKD stage 2,Generalized anxiety disorder,Late onset Alzheimer disease among others.she has a telephone visit today with assistance of facility Nurse Tech present during visit. Nurse checked B/P during visit SBP 170 she denies any headache,dizziness,fatigue,palpitation,chest pain or shortness of breath.  Nurse states patient has not be able to walk by herself since discharge was using walker prior to hospital visit but now requires a wheelchair.she has been working with Physical Therapy and Occupation therapy.     Past Medical History:  Diagnosis Date  . Acute bronchitis   . Alzheimer disease ([place])   . Anemia   . Benign neoplasm of uterus, part unspecified   . Chronic ischemic heart disease, unspecified   . Chronic kidney disease, stage II (  mild)   . Cough   . Encounter for long-term (current) use of other medications   . Fall 01/30/2016  . Ganglion, unspecified     . Hearing loss   . Heart murmur   . Hyperlipidemia   . Hypertension   . Impacted cerumen   . Light-headed feeling 09/25/2015  . Macular degeneration (senile) of retina, unspecified   . Mitral valve disorders(424.0)   . Nonspecific abnormal electrocardiogram (ECG) (EKG)   . Osteoporosis, unspecified   . Other abnormal blood chemistry   . Other emphysema ([place])   . Other malaise and fatigue   . Palpitations   . Senile cataract, unspecified   . Senile purpura ([place]) 06/06/2015  . Thrombocytopenia, unspecified ([place])   . Unspecified hearing loss    Past Surgical History:  Procedure Laterality Date  . BIOPSY  05/30/2018   Procedure: BIOPSY;  Surgeon: Lavena Bullion, DO;  Location: [facility];  Service: Gastroenterology;;  . ESOPHAGOGASTRODUODENOSCOPY (EGD) WITH PROPOFOL N/A 05/30/2018   Procedure: ESOPHAGOGASTRODUODENOSCOPY (EGD) WITH PROPOFOL;  Surgeon: Lavena Bullion, DO;  Location: [facility];  Service: Gastroenterology;  Laterality: N/A;  . KNEE ARTHROSCOPY     right    Allergies  Allergen Reactions  . Penicillins Nausea And Vomiting and Other (See Comments)    Did it involve swelling of the face/tongue/throat, SOB, or low BP? Unknown Did it involve sudden or severe rash/hives, skin peeling, or any reaction on the inside of your mouth or nose? Unknown Did you need to seek medical attention at a hospital or doctor's office? Unknown When did it last happen?unk If all above answers are "NO", may proceed with cephalosporin use.      Outpatient Encounter Medications as of 10/25/2019  Medication Sig  . acetaminophen (TYLENOL) 500 MG tablet Take 500 mg by mouth every 8 (eight) hours as needed (discomfort).  . Ensure (ENSURE) Take 1 Can by mouth daily.  . hydrALAZINE (APRESOLINE) 10 MG tablet Take 1 tablet (10 mg total) by mouth 3 (three) times daily.  Marland Kitchen LORazepam (ATIVAN) 0.5 MG tablet Take 0.5 tablets (0.25 mg total) by mouth daily as needed for anxiety.  . nebivolol  (BYSTOLIC) 5 MG tablet Take 1 tablet (5 mg total) by mouth daily.  . sertraline (ZOLOFT) 50 MG tablet Take 50 mg by mouth daily.  Marland Kitchen atorvastatin (LIPITOR) 10 MG tablet TAKE 1 TABLET ONCE DAILY TO LOWER CHOLESTEROL. (Patient taking differently: Take 10 mg by mouth at bedtime. To lower cholesterol)  . Cholecalciferol (VITAMIN D3) 50 MCG (2000 UT) capsule Take 1 capsule (2,000 Units total) by mouth daily.  Marland Kitchen donepezil (ARICEPT) 10 MG tablet TAKE ONE TABLET AT BEDTIME. (Patient taking differently: Take 10 mg by mouth at bedtime. )  . memantine (NAMENDA XR) 28 MG CP24 24 hr capsule TAKE 1 CAPSULE DAILY. (Patient taking differently: Take 28 mg by mouth daily. )   No facility-administered encounter medications on file as of 10/25/2019.    Review of Systems  Constitutional: Negative for appetite change, chills and fatigue.  HENT: Negative for congestion, rhinorrhea, sinus pressure, sinus pain, sneezing and sore throat.   Eyes: Negative for discharge, redness and itching.  Respiratory: Negative for cough, chest tightness, shortness of breath and wheezing.   Cardiovascular: Negative for chest pain, palpitations and leg swelling.  Gastrointestinal: Negative for abdominal distention, abdominal pain, constipation, diarrhea, nausea and vomiting.  Endocrine: Negative for cold intolerance, heat intolerance, polydipsia, polyphagia and polyuria.  Genitourinary: Negative for difficulty urinating, dysuria, flank pain, frequency  and urgency.  Musculoskeletal: Positive for gait problem.  Skin: Negative for color change, pallor and rash.  Neurological: Negative for dizziness, speech difficulty, light-headedness, numbness and headaches.       Generalized weakness  Hematological: Does not bruise/bleed easily.  Psychiatric/Behavioral: Negative for agitation, confusion and sleep disturbance. The patient is not nervous/anxious.     Immunization History  Administered Date(s) Administered  . Fluad Quad(high Dose 65+)  12/09/2018  . Influenza Split 09/18/2017  . Influenza, High Dose Seasonal PF 10/09/2016  . Influenza,inj,Quad PF,6+ Mos 11/23/2012, 11/30/2013, 12/06/2014  . Influenza-Unspecified 10/05/2009, 10/25/2010, 10/13/2011, 11/13/2015  . PFIZER SARS-COV-2 Vaccination 02/12/2019, 03/05/2019  . Pneumococcal Conjugate-13 07/01/2013, 02/09/2017  . Pneumococcal Polysaccharide-23 06/01/2006, 11/23/2012  . Pneumococcal-Unspecified 01/21/1995  . Td 08/20/1992, 06/01/2006, 06/23/2014  . Tdap 11/25/2012  . Zoster 02/11/2006, 11/14/2010   Pertinent  Health Maintenance Due  Topic Date Due  . MAMMOGRAM  11/22/2017  . INFLUENZA VACCINE  08/21/2019  . DEXA SCAN  Completed  . PNA vac Low Risk Adult  Completed   Fall Risk  10/25/2019 08/03/2019 07/18/2019 06/30/2019 06/02/2019  Falls in the past year? 1 1 0 1 1  Comment - Rolled out of bed. - - -  Number falls in past yr: 1 0 0 0 1  Injury with Fall? 0 1 0 1 -  Comment - Bruised elbow. - - -  Risk Factor Category  - - - - -  Risk for fall due to : - - - - -  Follow up - - - - -    Vitals:   10/25/19 1041  BP: (!) 180/78   There is no height or weight on file to calculate BMI. Physical Exam Unable to complete on telephone visit  Labs reviewed: Recent Labs    05/25/19 1427 10/21/19 2018  NA 140 140  K 4.6 4.5  CL 108 106  CO2 23 24  GLUCOSE 109 93  BUN 37* 36*  CREATININE 2.03* 1.99*  CALCIUM 8.6 9.3   Recent Labs    05/25/19 1427  AST 12  ALT 9  BILITOT 0.5  PROT 6.0*   Recent Labs    03/08/19 1050 03/08/19 1050 05/25/19 1427 07/20/19 0939 10/21/19 2018  WBC 5.5   < > 9.5 5.9 5.8  NEUTROABS 3.6  --  7,581 3,988  --   HGB 13.6   < > 11.3* 13.3 13.0  HCT 41.7   < > 33.6* 39.8 42.3  MCV 98.9   < > 96.8 99.0 99.8  PLT 120.0*   < > 124* 145 140*   < > = values in this interval not displayed.   Lab Results  Component Value Date   TSH 2.02 09/25/2015   Lab Results  Component Value Date   HGBA1C 5.5 03/22/2018   Lab  Results  Component Value Date   CHOL 199 06/11/2017   HDL 68 06/11/2017   LDLCALC 114 (H) 06/11/2017   TRIG 80 06/11/2017   CHOLHDL 2.9 06/11/2017    Significant Diagnostic Results in last 30 days:  CT Head Wo Contrast  Result Date: 10/21/2019 CLINICAL DATA:  Mental status change, unknown cause EXAM: CT HEAD WITHOUT CONTRAST TECHNIQUE: Contiguous axial images were obtained from the base of the skull through the vertex without intravenous contrast. COMPARISON:  CT 05/29/2018 FINDINGS: Brain: No evidence of acute infarction, hemorrhage, frank hydrocephalus, extra-axial collection, visible mass lesion or mass effect. Basal cisterns are patent. Lung midline intracranial structures are unremarkable. Cerebellar tonsils are normally  positioned. Symmetric prominence of the ventricles, cisterns and sulci compatible with parenchymal volume loss. Patchy areas of white matter hypoattenuation are most compatible with chronic microvascular angiopathy. Vascular: Atherosclerotic calcification of the carotid siphons and intradural vertebral arteries. No hyperdense vessel. Skull: No calvarial fracture or suspicious osseous lesion. No scalp swelling or hematoma. Sinuses/Orbits: Minimal nodular mural thickening in the left maxillary sinus. Remaining paranasal sinuses and mastoid air cells are predominantly clear. Debris noted in the external auditory canals bilaterally. Middle ear cavities are clear. Senescent scleral plaques with otherwise unremarkable orbital contents as included. Other: None IMPRESSION: 1. No acute intracranial findings. 2. Generalized parenchymal volume loss and chronic microvascular angiopathy. 3. Debris in the external auditory canals, correlate for cerumen impaction. Electronically Signed   By: Lovena Le M.D.   On: 10/21/2019 21:57    Assessment/Plan 1. Essential hypertension SBP still elevated 170-180's Asymptomatic - advise to continue to check B/p daily and Notify provider if B/p > 140/90   - will increase hydralazine 10 mg tablet three times daily to four times daily.continue on nebivolol 5 mg tablet daily. - hydrALAZINE (APRESOLINE) 10 MG tablet; Take 1 tablet (10 mg total) by mouth 4 (four) times daily.  Dispense: 90 tablet; Refill: 0 - Other/Misc lab test - For home use only DME Other see comment: orders written for Hydralazine 10 mg tablet four times daily.orders faxed to facility by CMA    2. Mixed hyperlipidemia Latest LDL 114 continue on atorvastatin 10 mg tablet daily.   3. Generalized weakness Continue with Physical and Occupation Therapy.  4. Dizziness Resolved. - Other/Misc lab test: Hydralazine as above.continue with PT/OT   5. Abnormal gait -Fall and safety precautions  - continue with PT/OT   6. Late onset Alzheimer's disease without behavioral disturbance ([place]) - No new behavioral issues reported  - continue on Memantine and Aricept   7. Right ear impacted cerumen Discharged with debrox but no instruction per facility Nurse - Nurse advised to instil debrox 6.5 % otic solution 5 drops into right ear twice daily x 4 days then Nurse to lavage with warm water and hydrogen peroxide.  Family/ staff Communication: Reviewed plan of care with patient  Labs/tests ordered: None   Next Appointment: As needed if symptoms worsen or fail to improve.   I connected with  Lindsey Salazar on 10/25/19 by a telephone enabled telemedicine application and verified that I am speaking with the correct person using two identifiers.   I discussed the limitations of evaluation and management by telemedicine. The patient expressed understanding and agreed to proceed.  Spent 17 minutes of non-face to face with patient      Sandrea Hughs, NP

## 2019-11-04 LAB — BASIC METABOLIC PANEL
BUN: 41 — AB (ref 4–21)
CO2: 24 — AB (ref 13–22)
Chloride: 107 (ref 99–108)
Creatinine: 2.2 — AB (ref 0.5–1.1)
Glucose: 73
Potassium: 4.6 (ref 3.4–5.3)
Sodium: 142 (ref 137–147)

## 2019-11-04 LAB — CBC: RBC: 4.23 (ref 3.87–5.11)

## 2019-11-04 LAB — CBC AND DIFFERENTIAL
HCT: 41 (ref 36–46)
Hemoglobin: 13.4 (ref 12.0–16.0)
Platelets: 179 (ref 150–399)
WBC: 8.5

## 2019-11-04 LAB — COMPREHENSIVE METABOLIC PANEL: Calcium: 9.6 (ref 8.7–10.7)

## 2019-11-14 ENCOUNTER — Encounter: Payer: Self-pay | Admitting: Internal Medicine

## 2019-11-28 ENCOUNTER — Telehealth: Payer: Self-pay

## 2019-11-28 ENCOUNTER — Other Ambulatory Visit: Payer: Self-pay | Admitting: *Deleted

## 2019-11-28 DIAGNOSIS — I10 Essential (primary) hypertension: Secondary | ICD-10-CM

## 2019-11-28 MED ORDER — SERTRALINE HCL 50 MG PO TABS: 50.0000 mg | ORAL_TABLET | Freq: Every day | ORAL | 0 refills | Status: AC

## 2019-11-28 MED ORDER — HYDRALAZINE HCL 10 MG PO TABS: 10.0000 mg | ORAL_TABLET | Freq: Four times a day (QID) | ORAL | 0 refills | Status: AC

## 2019-11-28 NOTE — Telephone Encounter (Signed)
Received refill request from pharmacy

## 2019-11-28 NOTE — Telephone Encounter (Signed)
Refill request received from pharmacy °

## 2019-12-22 ENCOUNTER — Encounter: Payer: Medicare Other | Admitting: Family

## 2019-12-23 ENCOUNTER — Other Ambulatory Visit: Payer: Self-pay

## 2019-12-23 ENCOUNTER — Telehealth: Payer: Self-pay

## 2019-12-23 ENCOUNTER — Encounter: Payer: Self-pay | Admitting: Internal Medicine

## 2019-12-23 ENCOUNTER — Encounter: Payer: Self-pay | Admitting: Family

## 2019-12-23 ENCOUNTER — Other Ambulatory Visit: Payer: Self-pay | Admitting: Family

## 2019-12-23 ENCOUNTER — Ambulatory Visit (INDEPENDENT_AMBULATORY_CARE_PROVIDER_SITE_OTHER): Payer: Medicare Other | Admitting: Family

## 2019-12-23 DIAGNOSIS — Z0000 Encounter for general adult medical examination without abnormal findings: Secondary | ICD-10-CM | POA: Diagnosis not present

## 2019-12-23 DIAGNOSIS — I10 Essential (primary) hypertension: Secondary | ICD-10-CM

## 2019-12-23 NOTE — Telephone Encounter (Signed)
[name], Lindsey Salazar are scheduled for a virtual visit with your provider today.    Just as we do with appointments in the office, we must obtain your consent to participate.  Your consent will be active for this visit and any virtual visit you may have with one of our providers in the next 365 days.    If you have a MyChart account, I can also send a copy of this consent to you electronically.  All virtual visits are billed to your insurance company just like a traditional visit in the office.  As this is a virtual visit, video technology does not allow for your provider to perform a traditional examination.  This may limit your provider's ability to fully assess your condition.  If your provider identifies any concerns that need to be evaluated in person or the need to arrange testing such as labs, EKG, etc, we will make arrangements to do so.    Although advances in technology are sophisticated, we cannot ensure that it will always work on either your end or our end.  If the connection with a video visit is poor, we may have to switch to a telephone visit.  With either a video or telephone visit, we are not always able to ensure that we have a secure connection.   I need to obtain your verbal consent now.   Are you willing to proceed with your visit today?   Lindsey Salazar has provided verbal consent on 12/23/2019 for a virtual visit (video or telephone).   Otis Peak, [place] 12/23/2019  10:00 AM

## 2019-12-23 NOTE — Patient Instructions (Signed)
Lindsey Salazar , Thank you for taking time to come for your Medicare Wellness Visit. I appreciate your ongoing commitment to your health goals. Please review the following plan we discussed and let me know if I can assist you in the future.   Screening recommendations/referrals: Colonoscopy: N/A  Mammogram: Declined  Bone Density: Up to date  Recommended yearly ophthalmology/optometry visit for glaucoma screening and checkup Recommended yearly dental visit for hygiene and checkup  Vaccinations: Influenza vaccine : Up to date  Pneumococcal vaccine : Up to date  Tdap vaccine : Up to date  Shingles vaccine : Up to date    Advanced directives: Yes   Conditions/risks identified: Advance age female > 60 yrs old,Hyperetenion,Dyslipidemia   Next appointment: 1 year    Preventive Care 84 Years and Older, Female Preventive care refers to lifestyle choices and visits with your health care provider that can promote health and wellness. What does preventive care include?  A yearly physical exam. This is also called an annual well check.  Dental exams once or twice a year.  Routine eye exams. Ask your health care provider how often you should have your eyes checked.  Personal lifestyle choices, including:  Daily care of your teeth and gums.  Regular physical activity.  Eating a healthy diet.  Avoiding tobacco and drug use.  Limiting alcohol use.  Practicing safe sex.  Taking low-dose aspirin every day.  Taking vitamin and mineral supplements as recommended by your health care provider. What happens during an annual well check? The services and screenings done by your health care provider during your annual well check will depend on your age, overall health, lifestyle risk factors, and family history of disease. Counseling  Your health care provider may ask you questions about your:  Alcohol use.  Tobacco use.  Drug use.  Emotional well-being.  Home and relationship  well-being.  Sexual activity.  Eating habits.  History of falls.  Memory and ability to understand (cognition).  Work and work Statistician.  Reproductive health. Screening  You may have the following tests or measurements:  Height, weight, and BMI.  Blood pressure.  Lipid and cholesterol levels. These may be checked every 5 years, or more frequently if you are over 31 years old.  Skin check.  Lung cancer screening. You may have this screening every year starting at age 42 if you have a 30-pack-year history of smoking and currently smoke or have quit within the past 15 years.  Fecal occult blood test (FOBT) of the stool. You may have this test every year starting at age 34.  Flexible sigmoidoscopy or colonoscopy. You may have a sigmoidoscopy every 5 years or a colonoscopy every 10 years starting at age 53.  Hepatitis C blood test.  Hepatitis B blood test.  Sexually transmitted disease (STD) testing.  Diabetes screening. This is done by checking your blood sugar (glucose) after you have not eaten for a while (fasting). You may have this done every 1-3 years.  Bone density scan. This is done to screen for osteoporosis. You may have this done starting at age 57.  Mammogram. This may be done every 1-2 years. Talk to your health care provider about how often you should have regular mammograms. Talk with your health care provider about your test results, treatment options, and if necessary, the need for more tests. Vaccines  Your health care provider may recommend certain vaccines, such as:  Influenza vaccine. This is recommended every year.  Tetanus, diphtheria, and acellular  pertussis (Tdap, Td) vaccine. You may need a Td booster every 10 years.  Zoster vaccine. You may need this after age 11.  Pneumococcal 13-valent conjugate (PCV13) vaccine. One dose is recommended after age 38.  Pneumococcal polysaccharide (PPSV23) vaccine. One dose is recommended after age  54. Talk to your health care provider about which screenings and vaccines you need and how often you need them. This information is not intended to replace advice given to you by your health care provider. Make sure you discuss any questions you have with your health care provider. Document Released: 02/02/2015 Document Revised: 09/26/2015 Document Reviewed: 11/07/2014 Elsevier Interactive Patient Education  2017 [place] Prevention in the Home Falls can cause injuries. They can happen to people of all ages. There are many things you can do to make your home safe and to help prevent falls. What can I do on the outside of my home?  Regularly fix the edges of walkways and driveways and fix any cracks.  Remove anything that might make you trip as you walk through a door, such as a raised step or threshold.  Trim any bushes or trees on the path to your home.  Use bright outdoor lighting.  Clear any walking paths of anything that might make someone trip, such as rocks or tools.  Regularly check to see if handrails are loose or broken. Make sure that both sides of any steps have handrails.  Any raised decks and porches should have guardrails on the edges.  Have any leaves, snow, or ice cleared regularly.  Use sand or salt on walking paths during winter.  Clean up any spills in your garage right away. This includes oil or grease spills. What can I do in the bathroom?  Use night lights.  Install grab bars by the toilet and in the tub and shower. Do not use towel bars as grab bars.  Use non-skid mats or decals in the tub or shower.  If you need to sit down in the shower, use a plastic, non-slip stool.  Keep the floor dry. Clean up any water that spills on the floor as soon as it happens.  Remove soap buildup in the tub or shower regularly.  Attach bath mats securely with double-sided non-slip rug tape.  Do not have throw rugs and other things on the floor that can make  you trip. What can I do in the bedroom?  Use night lights.  Make sure that you have a light by your bed that is easy to reach.  Do not use any sheets or blankets that are too big for your bed. They should not hang down onto the floor.  Have a firm chair that has side arms. You can use this for support while you get dressed.  Do not have throw rugs and other things on the floor that can make you trip. What can I do in the kitchen?  Clean up any spills right away.  Avoid walking on wet floors.  Keep items that you use a lot in easy-to-reach places.  If you need to reach something above you, use a strong step stool that has a grab bar.  Keep electrical cords out of the way.  Do not use floor polish or wax that makes floors slippery. If you must use wax, use non-skid floor wax.  Do not have throw rugs and other things on the floor that can make you trip. What can I do with my stairs?  Do not leave any items on the stairs.  Make sure that there are handrails on both sides of the stairs and use them. Fix handrails that are broken or loose. Make sure that handrails are as long as the stairways.  Check any carpeting to make sure that it is firmly attached to the stairs. Fix any carpet that is loose or worn.  Avoid having throw rugs at the top or bottom of the stairs. If you do have throw rugs, attach them to the floor with carpet tape.  Make sure that you have a light switch at the top of the stairs and the bottom of the stairs. If you do not have them, ask someone to add them for you. What else can I do to help prevent falls?  Wear shoes that:  Do not have high heels.  Have rubber bottoms.  Are comfortable and fit you well.  Are closed at the toe. Do not wear sandals.  If you use a stepladder:  Make sure that it is fully opened. Do not climb a closed stepladder.  Make sure that both sides of the stepladder are locked into place.  Ask someone to hold it for you, if  possible.  Clearly mark and make sure that you can see:  Any grab bars or handrails.  First and last steps.  Where the edge of each step is.  Use tools that help you move around (mobility aids) if they are needed. These include:  Canes.  Walkers.  Scooters.  Crutches.  Turn on the lights when you go into a dark area. Replace any light bulbs as soon as they burn out.  Set up your furniture so you have a clear path. Avoid moving your furniture around.  If any of your floors are uneven, fix them.  If there are any pets around you, be aware of where they are.  Review your medicines with your doctor. Some medicines can make you feel dizzy. This can increase your chance of falling. Ask your doctor what other things that you can do to help prevent falls. This information is not intended to replace advice given to you by your health care provider. Make sure you discuss any questions you have with your health care provider. Document Released: 11/02/2008 Document Revised: 06/14/2015 Document Reviewed: 02/10/2014 Elsevier Interactive Patient Education  2017 [employer].

## 2019-12-23 NOTE — Progress Notes (Signed)
This service is provided via telemedicine  No vital signs collected/recorded due to the encounter was a telemedicine visit.   Location of patient (ex: home, work): Home.  Patient consents to a telephone visit: Yes.  Location of the provider (ex: office, home):  [facility].  Name of any referring provider: Gayland Curry, DO   Names of all persons participating in the telemedicine service and their role in the encounter: Patient, Legrand Como Mabe/Caregiver, Beckey Rutter, RMA, Marlowe Sax, NP.    Time spent on call: 8 minutes spent on the phone with Medical Assistant.     Subjective:   Lindsey Salazar is a 86 y.o. female who presents for Medicare Annual (Subsequent) preventive examination.  Review of Systems     Cardiac Risk Factors include: advanced age (>12men, >42 women);hypertension;dyslipidemia     Objective:    There were no vitals filed for this visit. There is no height or weight on file to calculate BMI.  Advanced Directives 12/23/2019 10/25/2019 08/03/2019 07/18/2019 06/30/2019 06/02/2019 05/25/2019  Does Patient Have a Medical Advance Directive? Yes Yes Yes Yes Yes Yes Yes  Type of Advance Directive Living will;Healthcare Power of Attorney Living will;Healthcare Power of [place];Living will Living will;Healthcare Power of Attorney Living will;Healthcare Power of [place];Living will Living will;Healthcare Power of Attorney  Does patient want to make changes to medical advance directive? No - Patient declined No - Patient declined No - Patient declined No - Patient declined No - Patient declined No - Patient declined No - Patient declined  Copy of [place] in Chart? Yes - validated most recent copy scanned in chart (See row information) Yes - validated most recent copy scanned in chart (See row information) Yes - validated most recent copy scanned in chart (See row information) Yes  - validated most recent copy scanned in chart (See row information) Yes - validated most recent copy scanned in chart (See row information) Yes - validated most recent copy scanned in chart (See row information) Yes - validated most recent copy scanned in chart (See row information)    Current Medications (verified) Outpatient Encounter Medications as of 12/23/2019  Medication Sig  . acetaminophen (TYLENOL) 500 MG tablet Take 500 mg by mouth every 8 (eight) hours as needed (discomfort).  Marland Kitchen atorvastatin (LIPITOR) 10 MG tablet TAKE 1 TABLET ONCE DAILY TO LOWER CHOLESTEROL.  . Cholecalciferol (VITAMIN D3) 50 MCG (2000 UT) capsule Take 1 capsule (2,000 Units total) by mouth daily.  Marland Kitchen donepezil (ARICEPT) 10 MG tablet TAKE ONE TABLET AT BEDTIME.  Marland Kitchen Ensure (ENSURE) Take 1 Can by mouth daily.  . hydrALAZINE (APRESOLINE) 10 MG tablet Take 1 tablet (10 mg total) by mouth 4 (four) times daily.  Marland Kitchen LORazepam (ATIVAN) 0.5 MG tablet Take 0.5 tablets (0.25 mg total) by mouth daily as needed for anxiety.  . memantine (NAMENDA XR) 28 MG CP24 24 hr capsule TAKE 1 CAPSULE DAILY.  . nebivolol (BYSTOLIC) 5 MG tablet Take 1 tablet (5 mg total) by mouth daily.  . sertraline (ZOLOFT) 50 MG tablet Take 1 tablet (50 mg total) by mouth daily.   No facility-administered encounter medications on file as of 12/23/2019.    Allergies (verified) Penicillins   History: Past Medical History:  Diagnosis Date  . Acute bronchitis   . Alzheimer disease ([place])   . Anemia   . Benign neoplasm of uterus, part unspecified   . Chronic ischemic heart disease, unspecified   .  Chronic kidney disease, stage II (mild)   . Cough   . Encounter for long-term (current) use of other medications   . Fall 01/30/2016  . Ganglion, unspecified   . Hearing loss   . Heart murmur   . Hyperlipidemia   . Hypertension   . Impacted cerumen   . Light-headed feeling 09/25/2015  . Macular degeneration (senile) of retina, unspecified   . Mitral valve  disorders(424.0)   . Nonspecific abnormal electrocardiogram (ECG) (EKG)   . Osteoporosis, unspecified   . Other abnormal blood chemistry   . Other emphysema ([place])   . Other malaise and fatigue   . Palpitations   . Senile cataract, unspecified   . Senile purpura ([place]) 06/06/2015  . Thrombocytopenia, unspecified ([place])   . Unspecified hearing loss    Past Surgical History:  Procedure Laterality Date  . BIOPSY  05/30/2018   Procedure: BIOPSY;  Surgeon: Lavena Bullion, DO;  Location: [facility];  Service: Gastroenterology;;  . ESOPHAGOGASTRODUODENOSCOPY (EGD) WITH PROPOFOL N/A 05/30/2018   Procedure: ESOPHAGOGASTRODUODENOSCOPY (EGD) WITH PROPOFOL;  Surgeon: Lavena Bullion, DO;  Location: [facility];  Service: Gastroenterology;  Laterality: N/A;  . KNEE ARTHROSCOPY     right   Family History  Problem Relation Age of Onset  . Stroke Brother   . Cancer Sister        lung  . Parkinsonism Father        Alzheimer's  . Colon cancer Neg Hx   . Esophageal cancer Neg Hx   . Rectal cancer Neg Hx   . Stomach cancer Neg Hx    Social History   Socioeconomic History  . Marital status: Single    Spouse name: Not on file  . Number of children: 4  . Years of education: Not on file  . Highest education level: Not on file  Occupational History    Employer: RETIRED  Tobacco Use  . Smoking status: Never Smoker  . Smokeless tobacco: Never Used  Vaping Use  . Vaping Use: Never used  Substance and Sexual Activity  . Alcohol use: No  . Drug use: No  . Sexual activity: Not Currently  Other Topics Concern  . Not on file  Social History Narrative   Lives with husband.     Social Determinants of Health   Financial Resource Strain:   . Difficulty of Paying Living Expenses: Not on file  Food Insecurity:   . Worried About Charity fundraiser in the Last Year: Not on file  . Ran Out of Food in the Last Year: Not on file  Transportation Needs:   . Lack of Transportation (Medical):  Not on file  . Lack of Transportation (Non-Medical): Not on file  Physical Activity:   . Days of Exercise per Week: Not on file  . Minutes of Exercise per Session: Not on file  Stress:   . Feeling of Stress : Not on file  Social Connections:   . Frequency of Communication with Friends and Family: Not on file  . Frequency of Social Gatherings with Friends and Family: Not on file  . Attends Religious Services: Not on file  . Active Member of Clubs or Organizations: Not on file  . Attends Archivist Meetings: Not on file  . Marital Status: Not on file    Tobacco Counseling Counseling given: Not Answered   Clinical Intake:  Pre-visit preparation completed: No  Pain : No/denies pain     BMI - recorded: 18.87 Nutritional  Status: BMI <19  Underweight Nutritional Risks: None Diabetes: No  How often do you need to have someone help you when you read instructions, pamphlets, or other written materials from your doctor or pharmacy?: 3 - Sometimes (assitance) What is the last grade level you completed in school?: 12 grade  Diabetic?No   Interpreter Needed?: No  Information entered by :: Roseland Braun FNP-C   Activities of Daily Living In your present state of health, do you have any difficulty performing the following activities: 12/23/2019  Hearing? N  Vision? N  Difficulty concentrating or making decisions? Y  Comment memory  Walking or climbing stairs? Y  Comment uses a walker  Dressing or bathing? Y  Comment has assistance  Doing errands, shopping? Y  Comment has Agricultural engineer and eating ? Y  Comment needs assit with preparing food  Using the Toilet? N  In the past six months, have you accidently leaked urine? Y  Comment wears pull up  Do you have problems with loss of bowel control? N  Managing your Medications? Y  Comment needs assist  Managing your Finances? Y  Comment son assist  Housekeeping or managing your Housekeeping? Y  Comment  has assistance  Some recent data might be hidden    Patient Care Team: Gayland Curry, DO as PCP - General (Geriatric Medicine) Minus Breeding, MD as Consulting Physician (Cardiology) Stark Klein, MD as Consulting Physician (General Surgery) Netta Cedars, MD as Consulting Physician (Orthopedic Surgery) Richmond Campbell, MD as Consulting Physician (Gastroenterology) Nat Christen, MD as Attending Physician (Optometry) Hortencia Pilar, MD as Consulting Physician (Surgery) Madelon Lips, MD as Consulting Physician (Nephrology)  Indicate any recent Medical Services you may have received from other than [facility] providers in the past year (date may be approximate).     Assessment:   This is a routine wellness examination for Shalanda.  Hearing/Vision screen  Hearing Screening   125Hz  250Hz  500Hz  1000Hz  2000Hz  3000Hz  4000Hz  6000Hz  8000Hz   Right ear:           Left ear:           Comments: Yes, Hearing Concerns. Patient is hard of hearing and doesn't wear hearing aids.  Vision Screening Comments: No Vision Concerns. Patient wears prescription glasses.  Dietary issues and exercise activities discussed: Current Exercise Habits: Home exercise routine, Type of exercise: stretching, Time (Minutes): 45, Frequency (Times/Week): 3, Weekly Exercise (Minutes/Week): 135, Intensity: Mild, Exercise limited by: None identified  Goals    . Patient Stated     I would like to remain healthy       Depression Screen PHQ 2/9 Scores 12/23/2019 07/29/2018 06/03/2018 03/22/2018 12/14/2017 06/11/2017 02/09/2017  PHQ - 2 Score 0 0 0 0 0 0 0    Fall Risk Fall Risk  12/23/2019 10/25/2019 08/03/2019 07/18/2019 06/30/2019  Falls in the past year? 1 1 1  0 1  Comment - - Rolled out of bed. - -  Number falls in past yr: 0 1 0 0 0  Injury with Fall? 0 0 1 0 1  Comment - - Bruised elbow. - -  Risk Factor Category  - - - - -  Risk for fall due to : - - - - -  Follow up - - - - -    Any stairs in or around  the home? No  If so, are there any without handrails? No  Home free of loose throw rugs in walkways, pet beds, electrical cords, etc?  No  Adequate lighting in your home to reduce risk of falls? Yes   ASSISTIVE DEVICES UTILIZED TO PREVENT FALLS:  Life alert? No  Use of a cane, walker or w/c? Yes  Grab bars in the bathroom? Yes  Shower chair or bench in shower? Yes  Elevated toilet seat or a handicapped toilet? No   TIMED UP AND GO:  Was the test performed? No .  Length of time to ambulate 10 feet: N/A  sec.   Gait slow and steady with assistive device  Cognitive Function: MMSE - Mini Mental State Exam 12/21/2018 12/21/2018 12/14/2017 12/11/2015 06/06/2015  Orientation to time 1 1 2 5 5   Orientation to Place 5 5 5 5 5   Registration 2 2 3 3 3   Attention/ Calculation 0 0 5 4 5   Recall 0 0 0 1 1  Language- name 2 objects 2 2 2 2 2   Language- repeat 1 1 1 1 1   Language- follow 3 step command 3 3 3 3 3   Language- read & follow direction 1 1 1 1 1   Write a sentence 1 1 1 1 1   Copy design 1 1 0 0 1  Total score 17 17 23 26 28         Immunizations Immunization History  Administered Date(s) Administered  . Fluad Quad(high Dose 65+) 12/09/2018  . Influenza Split 09/18/2017  . Influenza, High Dose Seasonal PF 10/09/2016, 10/11/2019, 11/03/2019  . Influenza,inj,Quad PF,6+ Mos 11/23/2012, 11/30/2013, 12/06/2014  . Influenza-Unspecified 10/05/2009, 10/25/2010, 10/13/2011, 11/13/2015  . Moderna SARS-COVID-2 Vaccination 12/01/2019  . PFIZER SARS-COV-2 Vaccination 02/12/2019, 03/05/2019  . Pneumococcal Conjugate-13 07/01/2013, 02/09/2017  . Pneumococcal Polysaccharide-23 06/01/2006, 11/23/2012  . Pneumococcal-Unspecified 01/21/1995  . Td 08/20/1992, 06/01/2006, 06/23/2014  . Tdap 11/25/2012  . Zoster 02/11/2006, 11/14/2010    TDAP status: Up to date Flu Vaccine status: Up to date Pneumococcal vaccine status: Up to date Covid-19 vaccine status: Completed vaccines  Qualifies for  Shingles Vaccine? Yes   Zostavax completed Yes   Shingrix Completed?: Yes  Screening Tests Health Maintenance  Topic Date Due  . MAMMOGRAM  11/22/2017  . TETANUS/TDAP  06/22/2024  . INFLUENZA VACCINE  Completed  . DEXA SCAN  Completed  . COVID-19 Vaccine  Completed  . PNA vac Low Risk Adult  Completed    Health Maintenance  Health Maintenance Due  Topic Date Due  . MAMMOGRAM  11/22/2017    Colorectal cancer screening: No longer required.  Mammogram status: No longer required.  Bone Density status: Completed 02/16/2017. Results reflect: Bone density results: OSTEOPENIA. Repeat every 2 years.  Lung Cancer Screening: (Low Dose CT Chest recommended if Age 77-80 years, 30 pack-year currently smoking OR have quit w/in 15years.) does not qualify.   Lung Cancer Screening Referral: No   Additional Screening:  Hepatitis C Screening: does not qualify; Completed Yes   Vision Screening: Recommended annual ophthalmology exams for early detection of glaucoma and other disorders of the eye. Is the patient up to date with their annual eye exam?  Yes  Who is the provider or what is the name of the office in which the patient attends annual eye exams?Son will update  If pt is not established with a provider, would they like to be referred to a provider to establish care? No .   Dental Screening: Recommended annual dental exams for proper oral hygiene  Community Resource Referral / Chronic Care Management: CRR required this visit?  No   CCM required this visit?  No  Plan:   - Mammogram due but decline.   I have personally reviewed and noted the following in the patient's chart:   . Medical and social history . Use of alcohol, tobacco or illicit drugs  . Current medications and supplements . Functional ability and status . Nutritional status . Physical activity . Advanced directives . List of other physicians . Hospitalizations, surgeries, and ER visits in previous 12  months . Vitals . Screenings to include cognitive, depression, and falls . Referrals and appointments  In addition, I have reviewed and discussed with patient certain preventive protocols, quality metrics, and best practice recommendations. A written personalized care plan for preventive services as well as general preventive health recommendations were provided to patient.    Sandrea Hughs, NP   12/23/2019   Nurse Notes:

## 2019-12-29 ENCOUNTER — Other Ambulatory Visit: Payer: Self-pay

## 2019-12-29 ENCOUNTER — Telehealth: Payer: Self-pay

## 2019-12-29 ENCOUNTER — Encounter: Payer: Self-pay | Admitting: Internal Medicine

## 2019-12-29 ENCOUNTER — Ambulatory Visit (INDEPENDENT_AMBULATORY_CARE_PROVIDER_SITE_OTHER): Payer: Medicare Other | Admitting: Internal Medicine

## 2019-12-29 VITALS — BP 202/76

## 2019-12-29 DIAGNOSIS — G301 Alzheimer's disease with late onset: Secondary | ICD-10-CM | POA: Diagnosis not present

## 2019-12-29 DIAGNOSIS — R269 Unspecified abnormalities of gait and mobility: Secondary | ICD-10-CM

## 2019-12-29 DIAGNOSIS — Z9181 History of falling: Secondary | ICD-10-CM

## 2019-12-29 DIAGNOSIS — I10 Essential (primary) hypertension: Secondary | ICD-10-CM

## 2019-12-29 DIAGNOSIS — F0280 Dementia in other diseases classified elsewhere without behavioral disturbance: Secondary | ICD-10-CM

## 2019-12-29 MED ORDER — NEBIVOLOL HCL 10 MG PO TABS: 10.0000 mg | ORAL_TABLET | Freq: Every day | ORAL | 3 refills | Status: AC

## 2019-12-29 NOTE — Telephone Encounter (Signed)
Lindsey Salazar, Lindsey Salazar are scheduled for a virtual visit with your provider today.    Just as we do with appointments in the office, we must obtain your consent to participate.  Your consent will be active for this visit and any virtual visit you may have with one of our providers in the next 365 days.    If you have a MyChart account, I can also send a copy of this consent to you electronically.  All virtual visits are billed to your insurance company just like a traditional visit in the office.  As this is a virtual visit, video technology does not allow for your provider to perform a traditional examination.  This may limit your provider's ability to fully assess your condition.  If your provider identifies any concerns that need to be evaluated in person or the need to arrange testing such as labs, EKG, etc, we will make arrangements to do so.    Although advances in technology are sophisticated, we cannot ensure that it will always work on either your end or our end.  If the connection with a video visit is poor, we may have to switch to a telephone visit.  With either a video or telephone visit, we are not always able to ensure that we have a secure connection.   I need to obtain your verbal consent now.   Are you willing to proceed with your visit today?   Lindsey Salazar has provided verbal consent on 12/29/2019 for a virtual visit (video or telephone).   Tanna Savoy, CMA 12/29/2019  7:45 AM

## 2019-12-29 NOTE — Progress Notes (Signed)
This service is provided via telemedicine  No vital signs collected/recorded due to the encounter was a telemedicine visit.   Location of patient (ex: home, work):[facility]     Patient consents to a telephone visit: yes  Location of the provider (ex: office, home): [place]  Name of any referring provider: Dr. Mariea Clonts DO  Names of all persons participating in the telemedicine service and their role in the encounter: Dr. Mariea Clonts DO, Lindsey Salazar CMA, Legrand Como care giver and Lindsey Salazar   Time spent on call: 10 min   Virtual Visit via Video Note  I connected with Lindsey Salazar on 12/29/19 at  7:30 AM EST by a video enabled telemedicine application and verified that I am speaking with the correct person using two identifiers.  Location: Patient: [facility] Provider: [facility] office   I discussed the limitations of evaluation and management by telemedicine and the availability of in person appointments. The patient expressed understanding and agreed to proceed.  History of Present Illness: 86 yo with dementia, oscillating bps, frequent falls seen virtually with Lindsey Salazar.    Doing very well.  Just general decline.  Has shuffled gait--mind not telling her to lift feet.  Will freeze and has to take big steps.  She's had several falls, but last was 10/28.  Using rollator now.  Also reaches over to handrail for dizziness and unsteadiness.    BP in am 202/76 pre-meds.  Saw NP the other day and done an hour after meds--then 178/66, 152/74, 168/70.  HR upper 60s to low 70s.  She occasionally has headache, but she'll rest and be better.  She has prn tylenol for this, but no recent use in over a month.  No behaviors--just confusion, looking for family.  If she's super anxious, they'll have her husband come put her at ease.    Labs could be drawn there before she's seen at the facility to keep from having to bring her here.    Observations/Objective: 86 yo female at [facility]--attempts  made with video which would pop up temporarily and then disappear again.  Unclear why.   Sitting up at dining table with friends chatting away.   Got details above from [facility] including BPs listed.  Assessment and Plan: 1. Uncontrolled hypertension -will increase bystolic to 10mg  daily from 5mg  daily -cont hydralazine -cont bp checks 30 mins to 1 hr after am bp meds and if staying consistently over 962 systolic, let us know and we can adjust -note pt has dizziness now with high bps but no recent headaches reported either  2. Late onset Alzheimer's disease without behavioral disturbance ([place]) -is progressing and it's challenging for her to come out to visits due to severe disorientation at those times -staff are concerns she will fall and have some significant pain in need of direct on site evaluate to ensure comfort -we agreed to palliative care referral to [facility] for symptom mgt on site in coordination with [facility]  3. Abnormal gait -cont use of rollator--fortunately no falls in over a month -this is due to her dementia  4. Personal history of fall -fortunately no more major injuries since left humeral fx back in May prior to her admission to [facility] memory care--they are doing a great job with her there  Follow Up Instructions:   Nursing to contact us if BPs remain over 836 systolic on daily checks.   I discussed the assessment and treatment plan with the patient. The patient was provided an opportunity  to ask questions and all were answered. The patient agreed with the plan and demonstrated an understanding of the instructions.   The patient was advised to call back or seek an in-person evaluation if the symptoms worsen or if the condition fails to improve as anticipated.  I provided 25 minutes of non-face-to-face time during this encounter.  Ragnar Waas L. Rupa Lagan, D.O. [facility] [address], [place] [address] Cell Phone  (Mon-Fri 8am-5pm):  [phone] On Call:  [phone] & follow prompts after 5pm & weekends Office Phone:  [phone] Office Fax:  [phone]

## 2020-01-02 ENCOUNTER — Non-Acute Institutional Stay: Payer: Medicare Other | Admitting: Nurse Practitioner

## 2020-01-02 ENCOUNTER — Other Ambulatory Visit: Payer: Self-pay

## 2020-01-02 DIAGNOSIS — F0281 Dementia in other diseases classified elsewhere with behavioral disturbance: Secondary | ICD-10-CM

## 2020-01-02 DIAGNOSIS — Z515 Encounter for palliative care: Secondary | ICD-10-CM

## 2020-01-02 NOTE — Progress Notes (Signed)
[facility] Consult Note Telephone: [phone]  Fax: [phone]  PATIENT NAME: Lindsey Salazar [address] [place] [address] [phone] (home)  DOB: 08-06-1932 MRN: [id number]  PRIMARY CARE PROVIDER:    Gayland Curry, DO,  [place] [address] [phone]  REFERRING PROVIDER:   Gayland Curry, DO [place],  [place] [address] [phone]  RESPONSIBLE PARTY:   Extended Emergency Contact Information Primary Emergency Contact: [name] Address: [address]          [place], [place] [address] Johnnette Litter of [place] Phone: [phone] Relation: Son Secondary Emergency Contact: [name] [place] of [place] Phone: [phone] Mobile Phone: [phone] Relation: Daughter  I met face to face with patient in facility.  ASSESSMENT AND RECOMMENDATIONS:   Advance Care Planning: Today's visit consisted of building trust and discussions on palliative care medicine as specialized medical care for people living with serious illness, aimed at facilitating better quality of life through symptoms relief, assisting with advance care plan and establishing goals of care. Spoke with son Lindsey Salazar over the phone, he  expressed appreciation for education provided on palliative care and how it differs from Hospice care service.  Goal of care: Patient's goal of care is comfort, to stay safe and healthy.  Directives: Patient has advanced directive, per directive patient does not want her life prolonged if she has life limiting illness, she however desires tube feeding and IV fluids. Discussed the advance directives with son Lindsey Salazar. Lindsey Salazar verbalized that he is aware that patient may not want to be resuscitated in the event of cardiac or respiratory arrest, he wants to confirm with his siblings about this before making decisions about it. Lindsey Salazar report having a blank copy of MOST form  given to him by facility. Lindsey Salazar will call me, after the meeting his siblings to complete and sign the MOST form. Palliative care will continue to provide support to patient, family and the medical team.  Symptom Management: Uncontrolled blood pressure: Patient continues to have elevated blood pressures, SBP range in the last week 205-130, with reading mostly 170s and 180s, one episode of 130.  Staff report occassional complaints of headache managed with Tylenol 554m every 8hrs as needed, she has not received any in the last week. No report of chest pain, palpitation, or SOB. No report of nausea or vomiting. Patient currently on Hydralazine 176mfour times a day and Bystolic 1005WPvery morning. PCP want to be notified if SBP is consistently greater that 150. BP today is 180/60, reading obtained 3057m- 1hr after administration of Bystolic 72m79YIecommendation: Consider increasing Hydralazine to 36m81m mouth three times a day. May consider Clonidine 0.1mg 28mneeded daily for SBP >180 (for hypertensive emergency). Continue to monitor BP daily per current plan of care with adjustment to regimen as needed to meet goal of consistently controlled blood pressures. Anxiety: Patient currently on Sertraline 50mg 52my and Lorazepam 0.36mg b72muth daily as needed for anxiety. Staff report blood pressure tend to rise with increased anxiety. Son Lindsey Salazar noticing increased anxiety for patient, especially with change in routine. Staff report anxiety is worse in the evenings.  Recommendation: Consider increasing Sertraline from 50mg to31mg. Mo36mr for signs/symptoms of worsening cognition as well as for side effects of medication with adjustment to plan of care as needed. Dementia without behavior concerns: Patient with dementia (FAST 6b). Discussion on disease trajectory of dementia  held with Lindsey Salazar, made aware that dementia is progressive and terminal and likely to eventually lead to dysphagia, weight loss and  immobility. He verbalized understanding, emotional and supportive care provided. Son expressed concern of patient reporting of pain, he report patient with high tolerance of pain vs inability to articulate pain due to advance dementia. He report there has been occasions where patient sustain injury without knowing she is hurt. Lindsey Salazar made aware that verbal and non verbal behavior is considered when assessing patient for pain. No indication of pain noted on exam today.   Follow up Palliative Care Visit: Palliative care will continue to follow for goals of care clarification and symptom management. Return 4-6 weeks or prn.  Family /Caregiver/Community Supports: Patient lives in the memory care unit of [facility] facility. Son Lindsey Salazar is her 30, family very involved in her care.  Cognitive / Functional decline: Patient awake, alert, confused. She is dependent on staff for bathing and dressing, able to feed self. Staff report patient is mostly, continent or bowel and bladder. Walks with a front wheel walker, has shuffled gait, history of frequent falls, last fall a month ago. Patient receives 4 days a week of PT with facility.  I spent 60 minutes providing this consultation, time includes time spent with patient and on phone with son Lindsey Salazar, chart review, provider coordination, and documentation. More than 50% of the time in this consultation was spent counseling and coordinating communication.   CHIEF COMPLAINT: Initial palliative care consult.  HISTORY OF PRESENT ILLNESS:  Lindsey Salazar is a 86 y.o. year old female with multiple medical problems including Alzheimer's Dementia (FAST 6b), macular degeneration, HTN, HLD, CKD 2. Patient with uncontrolled hypertension, with SBP sometimes in the 200s. her Btstolic increased from $RemoveBefore'5mg'AHbbEmEWadkpX$  to $R'10mg'uW$  daily, patient also on Hydralazine $RemoveBefore'10mg'tCtYIIBkcplWP$  four times day. Palliative Care was asked to follow this patient by consultation request of Gayland Curry, DO  to help address advance care planning and goals of care.   CODE STATUS: Full code  PPS: 40%  HOSPICE ELIGIBILITY/DIAGNOSIS: TBD  PHYSICAL EXAM / ROS:   Current and past weights: 111.8lb up from 110.8lbs last month, Ht 42f 4", BMI 19.2kg/m2 General: NAD, frail appearing, thin, sitting in chair in dinning area finishing lunch Cardiovascular: denied chest pain, no edema  Pulmonary: no cough, no SOB, room air GI: appetite fair, no report of constipation, continent of bowel GU: denies dysuria, continent of urine MSK:  no joint and ROM abnormalities, ambulatory Skin: no rashes or wounds reported Neurological: Weakness, shuffled gait, confused  PAST MEDICAL HISTORY:  Past Medical History:  Diagnosis Date  . Acute bronchitis   . Alzheimer disease ([place])   . Anemia   . Benign neoplasm of uterus, part unspecified   . Chronic ischemic heart disease, unspecified   . Chronic kidney disease, stage II (mild)   . Cough   . Encounter for long-term (current) use of other medications   . Fall 01/30/2016  . Ganglion, unspecified   . Hearing loss   . Heart murmur   . Hyperlipidemia   . Hypertension   . Impacted cerumen   . Light-headed feeling 09/25/2015  . Macular degeneration (senile) of retina, unspecified   . Mitral valve disorders(424.0)   . Nonspecific abnormal electrocardiogram (ECG) (EKG)   . Osteoporosis, unspecified   . Other abnormal blood chemistry   . Other emphysema ([place])   . Other malaise and fatigue   . Palpitations   . Senile cataract, unspecified   .  Senile purpura ([place]) 06/06/2015  . Thrombocytopenia, unspecified ([place])   . Unspecified hearing loss     SOCIAL HX:  Social History   Tobacco Use  . Smoking status: Never Smoker  . Smokeless tobacco: Never Used  Substance Use Topics  . Alcohol use: No   FAMILY HX:  Family History  Problem Relation Age of Onset  . Stroke Brother   . Cancer Sister        lung  . Parkinsonism Father        Alzheimer's  . Colon  cancer Neg Hx   . Esophageal cancer Neg Hx   . Rectal cancer Neg Hx   . Stomach cancer Neg Hx     ALLERGIES:  Allergies  Allergen Reactions  . Penicillins Nausea And Vomiting and Other (See Comments)    Did it involve swelling of the face/tongue/throat, SOB, or low BP? Unknown Did it involve sudden or severe rash/hives, skin peeling, or any reaction on the inside of your mouth or nose? Unknown Did you need to seek medical attention at a hospital or doctor's office? Unknown When did it last happen?unk If all above answers are "NO", may proceed with cephalosporin use.       PERTINENT MEDICATIONS:  Outpatient Encounter Medications as of 01/02/2020  Medication Sig  . acetaminophen (TYLENOL) 500 MG tablet Take 500 mg by mouth every 8 (eight) hours as needed (discomfort).  Marland Kitchen atorvastatin (LIPITOR) 10 MG tablet TAKE 1 TABLET ONCE DAILY TO LOWER CHOLESTEROL.  . Cholecalciferol (VITAMIN D3) 50 MCG (2000 UT) capsule Take 1 capsule (2,000 Units total) by mouth daily.  Marland Kitchen donepezil (ARICEPT) 10 MG tablet TAKE ONE TABLET AT BEDTIME.  Marland Kitchen Ensure (ENSURE) Take 1 Can by mouth daily.  . hydrALAZINE (APRESOLINE) 10 MG tablet Take 1 tablet (10 mg total) by mouth 4 (four) times daily.  Marland Kitchen LORazepam (ATIVAN) 0.5 MG tablet Take 0.5 tablets (0.25 mg total) by mouth daily as needed for anxiety.  . memantine (NAMENDA XR) 28 MG CP24 24 hr capsule TAKE 1 CAPSULE DAILY.  . nebivolol (BYSTOLIC) 10 MG tablet Take 1 tablet (10 mg total) by mouth daily.  . sertraline (ZOLOFT) 50 MG tablet Take 1 tablet (50 mg total) by mouth daily.   No facility-administered encounter medications on file as of 01/02/2020.     Jari Favre, DNP, AGPCNP-BC

## 2020-01-05 NOTE — Telephone Encounter (Signed)
Message routed to Reed, Tiffany L, DO  

## 2020-02-06 ENCOUNTER — Telehealth: Payer: Self-pay | Admitting: *Deleted

## 2020-02-06 NOTE — Telephone Encounter (Signed)
Received fax from [facility] [phone] (routed fax to Dr. Mariea Clonts) Stated: Son requested UA C&S due to Increased confusion and Increased incontinence. Attached preliminary report as well as recent vital signs for records. (routed to Dr. Mariea Clonts)  Please Advise.

## 2020-02-06 NOTE — Telephone Encounter (Signed)
Spoke with Legrand Como at [employer] and verbal order given.

## 2020-02-06 NOTE — Telephone Encounter (Signed)
Ok to obtain UA c+s

## 2020-03-08 ENCOUNTER — Emergency Department (HOSPITAL_COMMUNITY)
Admission: EM | Admit: 2020-03-08 | Discharge: 2020-03-09 | Disposition: A | Discharge status: Home / Self Care | Payer: Medicare Other | Attending: Emergency Medicine | Admitting: Emergency Medicine

## 2020-03-08 ENCOUNTER — Other Ambulatory Visit: Payer: Self-pay

## 2020-03-08 ENCOUNTER — Encounter (HOSPITAL_COMMUNITY): Payer: Self-pay | Admitting: Emergency Medicine

## 2020-03-08 ENCOUNTER — Emergency Department (HOSPITAL_COMMUNITY): Payer: Medicare Other

## 2020-03-08 DIAGNOSIS — N182 Chronic kidney disease, stage 2 (mild): Secondary | ICD-10-CM | POA: Diagnosis not present

## 2020-03-08 DIAGNOSIS — Z79899 Other long term (current) drug therapy: Secondary | ICD-10-CM | POA: Insufficient documentation

## 2020-03-08 DIAGNOSIS — I1310 Hypertensive heart and chronic kidney disease without heart failure, with stage 1 through stage 4 chronic kidney disease, or unspecified chronic kidney disease: Secondary | ICD-10-CM | POA: Diagnosis not present

## 2020-03-08 DIAGNOSIS — R519 Headache, unspecified: Secondary | ICD-10-CM | POA: Diagnosis present

## 2020-03-08 DIAGNOSIS — G309 Alzheimer's disease, unspecified: Secondary | ICD-10-CM | POA: Insufficient documentation

## 2020-03-08 DIAGNOSIS — I10 Essential (primary) hypertension: Secondary | ICD-10-CM

## 2020-03-08 DIAGNOSIS — I259 Chronic ischemic heart disease, unspecified: Secondary | ICD-10-CM | POA: Insufficient documentation

## 2020-03-08 LAB — URINALYSIS, ROUTINE W REFLEX MICROSCOPIC
Bacteria, UA: NONE SEEN
Bilirubin Urine: NEGATIVE
Glucose, UA: NEGATIVE mg/dL
Hgb urine dipstick: NEGATIVE
Ketones, ur: NEGATIVE mg/dL
Leukocytes,Ua: NEGATIVE
Nitrite: NEGATIVE
Protein, ur: 30 mg/dL — AB
Specific Gravity, Urine: 1.01 (ref 1.005–1.030)
pH: 8 (ref 5.0–8.0)

## 2020-03-08 LAB — CBC WITH DIFFERENTIAL/PLATELET
Abs Immature Granulocytes: 0.06 10*3/uL (ref 0.00–0.07)
Basophils Absolute: 0.1 10*3/uL (ref 0.0–0.1)
Basophils Relative: 1 %
Eosinophils Absolute: 0 10*3/uL (ref 0.0–0.5)
Eosinophils Relative: 0 %
HCT: 44.2 % (ref 36.0–46.0)
Hemoglobin: 14.4 g/dL (ref 12.0–15.0)
Immature Granulocytes: 1 %
Lymphocytes Relative: 8 %
Lymphs Abs: 0.8 10*3/uL (ref 0.7–4.0)
MCH: 32.5 pg (ref 26.0–34.0)
MCHC: 32.6 g/dL (ref 30.0–36.0)
MCV: 99.8 fL (ref 80.0–100.0)
Monocytes Absolute: 0.8 10*3/uL (ref 0.1–1.0)
Monocytes Relative: 8 %
Neutro Abs: 8.4 10*3/uL — ABNORMAL HIGH (ref 1.7–7.7)
Neutrophils Relative %: 82 %
Platelets: 149 10*3/uL — ABNORMAL LOW (ref 150–400)
RBC: 4.43 MIL/uL (ref 3.87–5.11)
RDW: 12.9 % (ref 11.5–15.5)
WBC: 10.1 10*3/uL (ref 4.0–10.5)
nRBC: 0 % (ref 0.0–0.2)

## 2020-03-08 LAB — COMPREHENSIVE METABOLIC PANEL
ALT: 17 U/L (ref 0–44)
AST: 13 U/L — ABNORMAL LOW (ref 15–41)
Albumin: 3.5 g/dL (ref 3.5–5.0)
Alkaline Phosphatase: 99 U/L (ref 38–126)
Anion gap: 11 (ref 5–15)
BUN: 36 mg/dL — ABNORMAL HIGH (ref 8–23)
CO2: 24 mmol/L (ref 22–32)
Calcium: 9.7 mg/dL (ref 8.9–10.3)
Chloride: 104 mmol/L (ref 98–111)
Creatinine, Ser: 2.1 mg/dL — ABNORMAL HIGH (ref 0.44–1.00)
GFR, Estimated: 23 mL/min — ABNORMAL LOW (ref >60)
Glucose, Bld: 105 mg/dL — ABNORMAL HIGH (ref 70–99)
Potassium: 4.5 mmol/L (ref 3.5–5.1)
Sodium: 139 mmol/L (ref 135–145)
Total Bilirubin: 1.1 mg/dL (ref 0.3–1.2)
Total Protein: 7.4 g/dL (ref 6.5–8.1)

## 2020-03-08 MED ORDER — HYDRALAZINE HCL 10 MG PO TABS
10.0000 mg | ORAL_TABLET | Freq: Once | ORAL | Status: AC
  Administered 2020-03-08: 10 mg via ORAL
  Filled 2020-03-08: qty 1

## 2020-03-08 MED ORDER — HYDRALAZINE HCL 20 MG/ML IJ SOLN
10.0000 mg | Freq: Once | INTRAMUSCULAR | Status: AC
  Administered 2020-03-08: 10 mg via INTRAVENOUS
  Filled 2020-03-08: qty 1

## 2020-03-08 NOTE — ED Notes (Signed)
ED Provider at bedside. 

## 2020-03-08 NOTE — ED Notes (Signed)
Pt son at the bedside, reports he was called by [facility] (pt lives at memory care unit) that pt has been c/o headache and her BP had been elevated. Pt had her bystolic med this morning and her hydralazine doses at 0800, 1200 and 1600 today, has not yet 2000 medication. Pt has dementia, denies any current pain.

## 2020-03-08 NOTE — ED Triage Notes (Signed)
EMS stated, headache and Hypertension . BP has been elevated since this morning around 800.

## 2020-03-08 NOTE — ED Provider Notes (Signed)
[facility] EMERGENCY DEPARTMENT Provider Note  CSN: [id number] Arrival date & time: 03/08/20 1748    History Chief Complaint  Patient presents with  . Headache  . Hypertension    HPI  Lindsey Salazar is a 87 y.o. female with history of dementia, brought to the ED via EMS from [facility] for evaluation of headache and HTN. She is accompanied by her son who provides much of the history. She has history of labile blood pressures, currently on Bystolic and Hydralazine QID. She was complaining of a headache at [facility] today and they checked her BP which was significantly elevated. She has had improvement in her headache since arrival and currently denies any symptoms. Level 5 caveat applies due to dementia. She has missed her evening dose of oral Hydralazine   Past Medical History:  Diagnosis Date  . Acute bronchitis   . Alzheimer disease ([place])   . Anemia   . Benign neoplasm of uterus, part unspecified   . Chronic ischemic heart disease, unspecified   . Chronic kidney disease, stage II (mild)   . Cough   . Encounter for long-term (current) use of other medications   . Fall 01/30/2016  . Ganglion, unspecified   . Hearing loss   . Heart murmur   . Hyperlipidemia   . Hypertension   . Impacted cerumen   . Light-headed feeling 09/25/2015  . Macular degeneration (senile) of retina, unspecified   . Mitral valve disorders(424.0)   . Nonspecific abnormal electrocardiogram (ECG) (EKG)   . Osteoporosis, unspecified   . Other abnormal blood chemistry   . Other emphysema ([place])   . Other malaise and fatigue   . Palpitations   . Senile cataract, unspecified   . Senile purpura ([place]) 06/06/2015  . Thrombocytopenia, unspecified ([place])   . Unspecified hearing loss     Past Surgical History:  Procedure Laterality Date  . BIOPSY  05/30/2018   Procedure: BIOPSY;  Surgeon: Lavena Bullion, DO;  Location: [facility];  Service: Gastroenterology;;  . ESOPHAGOGASTRODUODENOSCOPY (EGD)  WITH PROPOFOL N/A 05/30/2018   Procedure: ESOPHAGOGASTRODUODENOSCOPY (EGD) WITH PROPOFOL;  Surgeon: Lavena Bullion, DO;  Location: [facility];  Service: Gastroenterology;  Laterality: N/A;  . KNEE ARTHROSCOPY     right    Family History  Problem Relation Age of Onset  . Stroke Brother   . Cancer Sister        lung  . Parkinsonism Father        Alzheimer's  . Colon cancer Neg Hx   . Esophageal cancer Neg Hx   . Rectal cancer Neg Hx   . Stomach cancer Neg Hx     Social History   Tobacco Use  . Smoking status: Never Smoker  . Smokeless tobacco: Never Used  Vaping Use  . Vaping Use: Never used  Substance Use Topics  . Alcohol use: No  . Drug use: No     Home Medications Prior to Admission medications   Medication Sig Start Date End Date Taking? Authorizing Provider  acetaminophen (TYLENOL) 500 MG tablet Take 500 mg by mouth every 8 (eight) hours as needed (discomfort).    [provider]  atorvastatin (LIPITOR) 10 MG tablet TAKE 1 TABLET ONCE DAILY TO LOWER CHOLESTEROL. 07/29/19   Reed, Tiffany L, DO  Cholecalciferol (VITAMIN D3) 50 MCG (2000 UT) capsule Take 1 capsule (2,000 Units total) by mouth daily. 12/14/17   Reed, Tiffany L, DO  donepezil (ARICEPT) 10 MG tablet TAKE ONE TABLET AT  BEDTIME. 06/06/19   Reed, Tiffany L, DO  Ensure (ENSURE) Take 1 Can by mouth daily.    [provider]  hydrALAZINE (APRESOLINE) 10 MG tablet Take 1 tablet (10 mg total) by mouth 4 (four) times daily. 11/28/19   Reed, Tiffany L, DO  LORazepam (ATIVAN) 0.5 MG tablet Take 0.5 tablets (0.25 mg total) by mouth daily as needed for anxiety. 08/12/19   Lauree Chandler, NP  memantine (NAMENDA XR) 28 MG CP24 24 hr capsule TAKE 1 CAPSULE DAILY. 06/06/19   Reed, Tiffany L, DO  nebivolol (BYSTOLIC) 10 MG tablet Take 1 tablet (10 mg total) by mouth daily. 12/29/19   Reed, Tiffany L, DO  sertraline (ZOLOFT) 50 MG tablet Take 1 tablet (50 mg total) by mouth daily. 11/28/19   Reed, Tiffany L,  DO     Allergies    Penicillins   Review of Systems   Review of Systems Unable to assess due to mental status.    Physical Exam BP (!) 177/61   Pulse 65   Temp 98.9 F (37.2 C) (Oral)   Resp 13   SpO2 99%   Physical Exam Vitals and nursing note reviewed.  Constitutional:      Appearance: Normal appearance.  HENT:     Head: Normocephalic and atraumatic.     Nose: Nose normal.     Mouth/Throat:     Mouth: Mucous membranes are moist.  Eyes:     Extraocular Movements: Extraocular movements intact.     Conjunctiva/sclera: Conjunctivae normal.  Cardiovascular:     Rate and Rhythm: Normal rate.  Pulmonary:     Effort: Pulmonary effort is normal.     Breath sounds: Normal breath sounds.  Abdominal:     General: Abdomen is flat.     Palpations: Abdomen is soft.     Tenderness: There is no abdominal tenderness.  Musculoskeletal:        General: No swelling. Normal range of motion.     Cervical back: Neck supple.  Skin:    General: Skin is warm and dry.  Neurological:     General: No focal deficit present.     Mental Status: She is alert. Mental status is at baseline. She is disoriented.  Psychiatric:        Mood and Affect: Mood normal.      ED Results / Procedures / Treatments   Labs (all labs ordered are listed, but only abnormal results are displayed) Labs Reviewed  CBC WITH DIFFERENTIAL/PLATELET - Abnormal; Notable for the following components:      Result Value   Platelets 149 (*)    Neutro Abs 8.4 (*)    All other components within normal limits  COMPREHENSIVE METABOLIC PANEL - Abnormal; Notable for the following components:   Glucose, Bld 105 (*)    BUN 36 (*)    Creatinine, Ser 2.10 (*)    AST 13 (*)    GFR, Estimated 23 (*)    All other components within normal limits  URINALYSIS, ROUTINE W REFLEX MICROSCOPIC - Abnormal; Notable for the following components:   Color, Urine STRAW (*)    Protein, ur 30 (*)    All other components within normal  limits    EKG None  Radiology CT Head Wo Contrast  Result Date: 03/08/2020 CLINICAL DATA:  Headache with hypertension EXAM: CT HEAD WITHOUT CONTRAST TECHNIQUE: Contiguous axial images were obtained from the base of the skull through the vertex without intravenous contrast. COMPARISON:  October 21, 2019 FINDINGS: Brain: Moderate diffuse atrophy is stable. There is no intracranial mass, hemorrhage, extra-axial fluid collection, or midline shift. There is mild patchy small vessel disease in the centra semiovale bilaterally. No acute appearing infarct is evident. Vascular: No hyperdense vessel. There is calcification in each carotid siphon region. Skull: The bony calvarium appears intact. Sinuses/Orbits: Visualized paranasal sinuses are clear. Orbits appear symmetric bilaterally. Other: Mastoid air cells are clear. IMPRESSION: Stable atrophy with mild patchy periventricular small vessel disease. No acute infarct evident. No mass or hemorrhage. Foci of arterial vascular calcification noted. Electronically Signed   By: Lowella Grip III M.D.   On: 03/08/2020 20:10    Procedures Procedures  Medications Ordered in the ED Medications  hydrALAZINE (APRESOLINE) tablet 10 mg (has no administration in time range)  hydrALAZINE (APRESOLINE) injection 10 mg (10 mg Intravenous Given 03/08/20 2230)     MDM Rules/Calculators/A&P MDM Labs and CT ordered in triage reviewed. CBC is normal, CMP with CKD at baseline and UA neg. CT head without acute process. Patient continues to have markedly elevated BP. Will give a dose of hydralazine IV and reassess. Discussed the nature and unpredictability of labile HTN with the son. Reassured him there is no signs of end organ damage. If BP is better controlled, even if not normal, anticipate she will be able to go back to her facility for outpatient management.  ED Course  I have reviewed the triage vital signs and the nursing notes.  Pertinent labs & imaging results  that were available during my care of the patient were reviewed by me and considered in my medical decision making (see chart for details).  Clinical Course as of 03/08/20 2324  Thu Mar 08, 2020  2318 BP improving, patient remains asymptomatic. Will give her evening oral dose of hydralazine as well and plan discharge back to [facility]. Son is amenable to that plan.  [CS]    Clinical Course User Index [CS] Truddie Hidden, MD    Final Clinical Impression(s) / ED Diagnoses Final diagnoses:  Hypertension, unspecified type  Acute nonintractable headache, unspecified headache type    Rx / DC Orders ED Discharge Orders    None       Truddie Hidden, MD 03/08/20 2324

## 2020-03-09 ENCOUNTER — Non-Acute Institutional Stay: Payer: Medicare Other | Admitting: Nurse Practitioner

## 2020-03-09 ENCOUNTER — Encounter: Payer: Self-pay | Admitting: Internal Medicine

## 2020-03-09 ENCOUNTER — Emergency Department (HOSPITAL_COMMUNITY): Payer: Medicare Other

## 2020-03-09 DIAGNOSIS — Z515 Encounter for palliative care: Secondary | ICD-10-CM

## 2020-03-09 DIAGNOSIS — I10 Essential (primary) hypertension: Secondary | ICD-10-CM

## 2020-03-09 MED ORDER — LORAZEPAM 2 MG/ML IJ SOLN
0.5000 mg | Freq: Once | INTRAMUSCULAR | Status: AC
  Administered 2020-03-09: 0.5 mg via INTRAVENOUS
  Filled 2020-03-09: qty 1

## 2020-03-09 NOTE — ED Notes (Signed)
Returned from MRI son at bedside. Patient still sleepy from medication.

## 2020-03-09 NOTE — ED Provider Notes (Signed)
At time of discharge, patient reportedly began feeling worse.  Son reports she is having a tremor in her right arm only.  She is reporting headache and neck pain.  Son reports there is no falls in the past 24 hours.  Her initial complaint was headache and not feeling well. On My exam she is awake and alert no arm or leg drift.  No obvious facial droop.  She does appear anxious.  EKG as below   EKG Interpretation  Date/Time:  Thursday March 08 2020 23:50:23 EST Ventricular Rate:  61 PR Interval:    QRS Duration: 88 QT Interval:  447 QTC Calculation: 451 R Axis:   49 Text Interpretation: Sinus rhythm Atrial premature complex Confirmed by Ripley Fraise [phone]) on 03/09/2020 12:10:50 AM      Due to persistent hypertension and worsening symptoms, I advised MRI brain.  Patient and son agree with plan.  Son feels the patient can likely tolerate an MRI and has no contraindications   Ripley Fraise, MD 03/09/20 [phone]

## 2020-03-09 NOTE — ED Provider Notes (Signed)
MRI negative Pt improved, resting comfortably Will d/c back to facility    Ripley Fraise, MD 03/09/20 [phone]

## 2020-03-09 NOTE — Progress Notes (Signed)
[facility] Consult Note Telephone: [phone]  Fax: [phone]  PATIENT NAME: Lindsey Salazar [address] [place] [address] [phone] (home)  DOB: 1932-12-20 MRN: [id number]  PRIMARY CARE PROVIDER:    Gayland Curry, DO,  [place] [address] [phone]  REFERRING PROVIDER:   Gayland Curry, DO [place],  [place] [address] [phone]  RESPONSIBLE PARTY:   Extended Emergency Contact Information Primary Emergency Contact: [name] Address: [address]          [place], [place] [address] Johnnette Litter of [place] Phone: [phone] Relation: Son Secondary Emergency Contact: [name] [place] of [place] Phone: [phone] Mobile Phone: [phone] Relation: Daughter  I met face to face with patient in facility. Patient's daughter Lindsey Salazar present at visit.  ASSESSMENT AND RECOMMENDATIONS:   Advance Care Planning: ACP discussion held with patient's daughter Lindsey Salazar, she reiterated family's desire for comfort for patient, stating family want patient to be comfortable in her current living environment.   Patient: After discussions on ramifications and implications of code status, Lindsey Salazar report family have decided for patient to not be resuscitated in the event of cardiac or respiratory arrest, saying the decision is inline with patient's advance directive ad living will. DNR form signed and given to facility staff, copy uploaded to [facility] EMR. Lindsey Salazar said family has not finalized the choices, MOST form completion deferred to another time.   Symptom Management:  High blood pressure: Patient s/p ED visit for hypertensive emergency yesterday. Her blood pressure was reported to be 248/80 in facility and 177/61 on arrival to ED. Patient was said to have complaints of severe headache and not feeling well. No report of s/s of stroke, MRI completed in ED  was unremarkable. Patient discharged to facility after treatment. No report of nausea or vomiting. SBP range in the last 2 weeks is 125 to 222, BP this morning 153/78, denied headache. No report of fever or chills. Recommendation: Recommend starting patient on Clonidine 0.63m by mouth daily as needed for SBP greater than 170. Continue to monitor BP per current plan of care with adjustments to regimen as needed to meet goal of consistently controled BP.  Follow up Palliative Care Visit: Palliative care will continue to follow for complex decision making and symptom management. Return in about 6-8  weeks or prn.  Family /Caregiver/Community Supports: [facility] Family very involved in her care.  Cognitive / Functional decline: Patient sleepy during visit, staff report she returned from ED early this morning. She is dependent on staff for bathing and dressing, able to feed self, mostly continent or bowel and bladder. Has shuffled gait, walks with a front wheel walker.  I spent 48 minutes providing this consultation, time includes time spent with patient and family, chart review, provider coordination, and documentation. More than 50% of the time in this consultation was spent counseling and coordinating communication.   CHIEF COMPLAINT: High blood pressures  History obtained from review of EMR, discussion with facility staff and interview with family. Records reviewed and summarized bellow.  HISTORY OF PRESENT ILLNESS:  Lindsey Salazar a 87y.o. year old female with multiple medical problems including Alzheimer's Dementia (FAST 6b), macular degeneration, HTN, HLD, CKD 2. Patient is s/p ED visit for hypertensive emergency and complaints of severe headache, BP was reported to be 248/80, in ED  BP was noted to be 177/61. MRI and CT scan were unremarkable. Patient was treated and discharged back to the facility. Patient with  history of uncontrolled hypertension. She is currently on Bystolic 76HY daily and Hydralazine 41m four times daily. Palliative Care was asked to help address advance care planning and goals of care. This a follow up visit for 01/01/2021.  CODE STATUS: DNR  PPS: 40%  HOSPICE ELIGIBILITY/DIAGNOSIS: TBD  PHYSICAL EXAM / ROS:   Current and past weights: 115.6lb up from 111.8lbs at last visit 2 months ago, Ht 548f", BMI 19.8kg/m2 General: frail appearing, sitting in reliner in her room in NAD Cardiovascular: denied chest pain, denied palpitation, no edema  Pulmonary: no cough, no SOB, room air GI: appetite fair, no report of constipation, continent of bowel GU: denies dysuria, continent of urine MSK:  no joint and ROM abnormalities, ambulatory Skin: no rashes or wounds reported Neurological: Weakness, confused Psych: non-anxious affect  PAST MEDICAL HISTORY:  Past Medical History:  Diagnosis Date  . Acute bronchitis   . Alzheimer disease ([place]  . Anemia   . Benign neoplasm of uterus, part unspecified   . Chronic ischemic heart disease, unspecified   . Chronic kidney disease, stage II (mild)   . Cough   . Encounter for long-term (current) use of other medications   . Fall 01/30/2016  . Ganglion, unspecified   . Hearing loss   . Heart murmur   . Hyperlipidemia   . Hypertension   . Impacted cerumen   . Light-headed feeling 09/25/2015  . Macular degeneration (senile) of retina, unspecified   . Mitral valve disorders(424.0)   . Nonspecific abnormal electrocardiogram (ECG) (EKG)   . Osteoporosis, unspecified   . Other abnormal blood chemistry   . Other emphysema ([facility]  . Other malaise and fatigue   . Palpitations   . Senile cataract, unspecified   . Senile purpura ([place]5/17/2017  . Thrombocytopenia, unspecified ([place]  . Unspecified hearing loss     SOCIAL HX:  Social History   Tobacco Use  . Smoking status: Never Smoker  . Smokeless tobacco: Never Used  Substance Use  Topics  . Alcohol use: No   FAMILY HX:  Family History  Problem Relation Age of Onset  . Stroke Brother   . Cancer Sister        lung  . Parkinsonism Father        Alzheimer's  . Colon cancer Neg Hx   . Esophageal cancer Neg Hx   . Rectal cancer Neg Hx   . Stomach cancer Neg Hx     ALLERGIES:  Allergies  Allergen Reactions  . Penicillins Nausea And Vomiting and Other (See Comments)    Did it involve swelling of the face/tongue/throat, SOB, or low BP? Unknown Did it involve sudden or severe rash/hives, skin peeling, or any reaction on the inside of your mouth or nose? Unknown Did you need to seek medical attention at a hospital or doctor's office? Unknown When did it last happen?unk If all above answers are "NO", may proceed with cephalosporin use.       PERTINENT MEDICATIONS:  Outpatient Encounter Medications as of 03/09/2020  Medication Sig  . acetaminophen (TYLENOL) 500 MG tablet Take 500 mg by mouth every 8 (eight) hours as needed (discomfort).  . Marland Kitchentorvastatin (LIPITOR) 10 MG tablet TAKE 1 TABLET ONCE DAILY TO LOWER CHOLESTEROL. (Patient taking differently: Take 10 mg by mouth at bedtime.)  . Cholecalciferol (VITAMIN D3) 50 MCG (  2000 UT) capsule Take 1 capsule (2,000 Units total) by mouth daily.  Marland Kitchen donepezil (ARICEPT) 10 MG tablet TAKE ONE TABLET AT BEDTIME. (Patient taking differently: Take 10 mg by mouth at bedtime.)  . Ensure (ENSURE) Take 1 Can by mouth daily.  . hydrALAZINE (APRESOLINE) 10 MG tablet Take 1 tablet (10 mg total) by mouth 4 (four) times daily.  Marland Kitchen LORazepam (ATIVAN) 0.5 MG tablet Take 0.5 tablets (0.25 mg total) by mouth daily as needed for anxiety.  . memantine (NAMENDA XR) 28 MG CP24 24 hr capsule TAKE 1 CAPSULE DAILY. (Patient taking differently: Take 28 mg by mouth daily.)  . nebivolol (BYSTOLIC) 10 MG tablet Take 1 tablet (10 mg total) by mouth daily.  . sertraline (ZOLOFT) 50 MG tablet Take 1 tablet (50 mg total) by mouth daily.   No  facility-administered encounter medications on file as of 03/09/2020.    Thank you for the opportunity to participate in the care of Ms. Amanda Cockayne. The palliative care team will continue to follow. Please call our office at [phone] if we can be of additional assistance.   Jari Favre, DNP, AGPCNP-BC

## 2020-03-09 NOTE — ED Notes (Signed)
Patient transported to  mri 

## 2020-03-12 ENCOUNTER — Encounter: Payer: Self-pay | Admitting: Internal Medicine

## 2020-04-16 ENCOUNTER — Other Ambulatory Visit: Payer: Self-pay

## 2020-04-16 ENCOUNTER — Non-Acute Institutional Stay: Payer: Medicare Other | Admitting: Nurse Practitioner

## 2020-04-16 DIAGNOSIS — F0280 Dementia in other diseases classified elsewhere without behavioral disturbance: Secondary | ICD-10-CM

## 2020-04-16 DIAGNOSIS — Z515 Encounter for palliative care: Secondary | ICD-10-CM

## 2020-04-16 NOTE — Progress Notes (Signed)
[facility] Consult Note Telephone: [phone]  Fax: [phone]  PATIENT NAME: Lindsey Salazar [address] [place] [address] [phone] (home)  DOB: [birth year] MRN: [id number]  PRIMARY CARE PROVIDER:    Gayland Curry, DO,  [place] [address] [phone]  REFERRING PROVIDER:   Gayland Curry, DO [place],  [place] [address] [phone]  RESPONSIBLE PARTY:   Extended Emergency Contact Information Primary Emergency Contact: Lindsey Salazar Address: [address]          [place], [place] [address] Lindsey Salazar of [place] Phone: [phone] Relation: Son Secondary Emergency Contact: Lindsey Salazar States of [place] Phone: [phone] Mobile Phone: [phone] Relation: Daughter  I met face to face with patient in facility.   ASSESSMENT AND RECOMMENDATIONS:   Advance Care Planning: ACP discussion held with patient's son Lindsey Salazar via tele-phone. Goal of care: Goal of care is comfort. Lindsey Salazar reiterated family's desire for patient to be comfortable as she lives out the rest of her life. Directives: Signed DNR present on file in the facility. MOST form completed with son over the phone, details of MOST form include comfort measures, determine use and limited of antibiotics, no IV fluids, no feeding tube.  Symptom Management:  Dementia: Mechanical fall related to progressive decline in function and cognition due to advance dementia, no report of injury. Patient now ambulates with a wheel chair. No other concerns reported today. Staff report good appetite and stable behavior. Recommendation: Continue supportive care. Continue the use chair alarm. Discussion on disease trajectory of dementia with family, as it is progressive and terminal and likely to eventually lead to dysphagia, weight loss and immobility. Emotional and supportive care provided. High blood pressure: SBP  range in the last month 97-187, HR 51-71. No ED visit for hypertensive urgency. Recommendation: continue Bystolic 49EE daily, Hydralazine 8m four times daily, and Clonidine 0.172mPRN for SBP >170. Follow up Palliative Care Visit: Palliative care will continue to follow for complex decision making and symptom management. Return in 6-8 weeks or prn.  Family /Caregiver/Community Supports:  [facility]. Family very involved in her care.  Cognitive / Functional decline: Patient awake and alert, confused. She is dependent on staff for al of her ADLs, able to feed self. Incontinent of bladder. Has shuffled gait, walks with a front wheel walker, was in wheelchair at visit today.  I spent 35 minutes providing this consultation, time includes time spent with patient, chart review, provider coordination, and documentation. More than 50% of the time in this consultation was spent counseling and coordinating communication.   CHIEF COMPLAINT:  Fall  History obtained from review of EMR, discussion with facility staff, and interview with family. Records reviewed and summarized bellow.  HISTORY OF PRESENT ILLNESS:Lindsey Apple Hutchisonis a [age].o.year old femalewith multiple medical problems including Alzheimer's Dementia (FAST 6e), macular degeneration, HTN, HLD, CKD 2. Patient with history of uncontrolled hypertension, with repeat ED visit for hypertensive emergency at last visit, she was started on Clonidine 0.69m84ms needed for SBP >170. Staff report med used twice in the last month, with no ED visit. No report of chest pain, or palpitation. No report of fever, chills, or SOB.Palliative Care is following patient to help address advance care planning, goals of care and symptoms management. This a follow up visit from 03/09/2020.  CODE STATUS: DNR  PPS: 40%  HOSPICE ELIGIBILITY/DIAGNOSIS: TBD  Review of Systems: Limited ROS due to  poor cognition. Pertinent ROS noted in HPI.    Physical Exam: General: frail appearing, cooperative, sitting wheel chair in NAD EYES: anicteric sclera, lids intact, no discharge  ENMT: intact hearing, oral mucous membranes moist CV:  no LE edema Pulmonary: no increased work of breathing, no cough, no audible wheezes, room air Abdomen: no ascites GU: deferred MSK: moves all extremities, no contractures Skin: warm and dry, no rashes or wounds on visible skin Neuro: Generalized weakness, otherwise non focal Psych: non-anxious affect today, A and O x 1 Hem/lymph/immuno: no widespread bruising   PAST MEDICAL HISTORY:  Past Medical History:  Diagnosis Date  . Acute bronchitis   . Alzheimer disease ([place])   . Anemia   . Benign neoplasm of uterus, part unspecified   . Chronic ischemic heart disease, unspecified   . Chronic kidney disease, stage II (mild)   . Cough   . Encounter for long-term (current) use of other medications   . Fall 01/30/2016  . Ganglion, unspecified   . Hearing loss   . Heart murmur   . Hyperlipidemia   . Hypertension   . Impacted cerumen   . Light-headed feeling 09/25/2015  . Macular degeneration (senile) of retina, unspecified   . Mitral valve disorders(424.0)   . Nonspecific abnormal electrocardiogram (ECG) (EKG)   . Osteoporosis, unspecified   . Other abnormal blood chemistry   . Other emphysema ([place])   . Other malaise and fatigue   . Palpitations   . Senile cataract, unspecified   . Senile purpura ([place]) 06/06/2015  . Thrombocytopenia, unspecified ([facility])   . Unspecified hearing loss     SOCIAL HX:  Social History   Tobacco Use  . Smoking status: Never Smoker  . Smokeless tobacco: Never Used  Substance Use Topics  . Alcohol use: No   FAMILY HX:  Family History  Problem Relation Age of Onset  . Stroke Brother   . Cancer Sister        lung  . Parkinsonism Father        Alzheimer's  . Colon cancer Neg Hx   . Esophageal cancer Neg Hx   . Rectal  cancer Neg Hx   . Stomach cancer Neg Hx     ALLERGIES:  Allergies  Allergen Reactions  . Penicillins Nausea And Vomiting and Other (See Comments)    Did it involve swelling of the face/tongue/throat, SOB, or low BP? Unknown Did it involve sudden or severe rash/hives, skin peeling, or any reaction on the inside of your mouth or nose? Unknown Did you need to seek medical attention at a hospital or doctor's office? Unknown When did it last happen?unk If all above answers are "NO", may proceed with cephalosporin use.       PERTINENT MEDICATIONS:  Outpatient Encounter Medications as of 04/16/2020  Medication Sig  . acetaminophen (TYLENOL) 500 MG tablet Take 500 mg by mouth every 8 (eight) hours as needed (discomfort).  Marland Kitchen atorvastatin (LIPITOR) 10 MG tablet TAKE 1 TABLET ONCE DAILY TO LOWER CHOLESTEROL. (Patient taking differently: Take 10 mg by mouth at bedtime.)  . Cholecalciferol (VITAMIN D3) 50 MCG (2000 UT) capsule Take 1 capsule (2,000 Units total) by mouth daily.  Marland Kitchen donepezil (ARICEPT) 10 MG tablet TAKE ONE TABLET AT BEDTIME. (Patient taking differently: Take 10 mg by mouth at bedtime.)  . Ensure (ENSURE) Take 1 Can by mouth daily.  . hydrALAZINE (APRESOLINE) 10 MG tablet Take 1 tablet (  10 mg total) by mouth 4 (four) times daily.  Marland Kitchen LORazepam (ATIVAN) 0.5 MG tablet Take 0.5 tablets (0.25 mg total) by mouth daily as needed for anxiety.  . memantine (NAMENDA XR) 28 MG CP24 24 hr capsule TAKE 1 CAPSULE DAILY. (Patient taking differently: Take 28 mg by mouth daily.)  . nebivolol (BYSTOLIC) 10 MG tablet Take 1 tablet (10 mg total) by mouth daily.  . sertraline (ZOLOFT) 50 MG tablet Take 1 tablet (50 mg total) by mouth daily.   No facility-administered encounter medications on file as of 04/16/2020.    Thank you for the opportunity to participate in the care of Ms. Amanda Cockayne The palliative care team will continue to follow. Please call our office at [phone] if we can be of  additional assistance.  Jari Favre, DNP, AGPCNP-BC

## 2020-05-02 ENCOUNTER — Other Ambulatory Visit: Payer: Self-pay

## 2020-05-02 ENCOUNTER — Non-Acute Institutional Stay: Payer: Medicare Other | Admitting: Nurse Practitioner

## 2020-05-02 DIAGNOSIS — Z515 Encounter for palliative care: Secondary | ICD-10-CM

## 2020-05-02 DIAGNOSIS — R531 Weakness: Secondary | ICD-10-CM

## 2020-05-02 NOTE — Progress Notes (Signed)
[facility] Consult Note Telephone: [phone]  Fax: [phone]    Date of encounter: 05/02/20 PATIENT NAME: Lindsey Salazar [address] [place] [address]   [phone] (home)  DOB: [birth year] MRN: [id number] PRIMARY CARE PROVIDER:    Lauree Chandler, NP,  [place] [address] [phone]  REFERRING PROVIDER:   Lauree Chandler, NP [place],  [place] [address] [phone]  RESPONSIBLE PARTY:    Contact Information    Name Relation Home Work [address]   Herbert Seta   [phone]   Tad Moore [phone]  [phone]      I met face to face with patient facility.  Palliative Care was asked to follow this patient by consultation request of  Lauree Chandler, NP to address advance care planning and complex medical decision making. This is a follow up visit from 04/16/2020.                                   ASSESSMENT AND PLAN / RECOMMENDATIONS:   Advance Care Planning/Goals of Care:  Code status:  DNR Goal of care: Patient goal of care is comfort. Directives: Signed DNR and MOST form present on file in the facility. Details of MOST form include comfort measures, determine use and limited of antibiotics, no IV fluids, no feeding tube.  Symptom Management/Plan: Weakness: No evidence of acute distress noted on exam today. Patient awake and alert, was feeding self dinner. Was appropriate, she complemented me on my new hair. Patient has a scheduled appointment with facility provider in 4 days. Consider obtaining routine labs to rule out reversible causes. Weakness and word finding may likely be related to progression of her Dementia. Continue to encourage participation in facility activity as able. Consider physical therapy for strengthening and balance. Visit update discussed with patient's son. Questions and concerns were addressed. Family was encouraged to  call with questions and/or concerns. Discussion on disease trajectory of dementia with family, as it is progressive and terminal and likely to eventually lead to dysphagia, weight loss and/or immobility. Emotional and supportive care provided.  Follow up Palliative Care Visit: Palliative care will continue to follow for complex medical decision making, advance care planning, and clarification of goals. Return in about 6-8 weeks or prn.  This visit was coded based on medical decision making (MDM).  PPS: 40%  HOSPICE ELIGIBILITY/DIAGNOSIS: TBD  CHIEF COMPLAINT: weakness  History obtained from review of EMR, discussion with facility staff, and interview with patient and son Lindsey Salazar.  I reviewed available labs, medications, imaging, studies and related documents from the EMR. Records reviewed and summarized [place].  HISTORY OF PRESENT ILLNESS:Lindsey Apple Hutchisonis a 87 y.o.year old femalewith multiple medical problems including Alzheimer's Dementia (FAST 6e), macular degeneration, HTN, HLD, CKD. Staff report increased weakness, worse in the last weak, symptoms occurs intermittently, and associated with patient often leaning towards her right side when sitting, no aggravating or relieving factors. Family report occasional difficulty with word finding and inability to feed self. Staff report good appetite and improved weight. Vital signs stable from review of facility log. No report of fever, chills, SOB, chest pain, or falls, ambulates with a wheelchair.   Reviewed labs 03/08/2020 Component Ref Range & Units 1 mo ago  (03/08/20) 6 mo ago  (11/04/19) 6 mo ago  (11/03/19) 6 mo ago  (10/21/19) 11 mo ago  (  05/25/19) 1 yr ago  (05/31/18) 1 yr ago  (05/30/18)  Sodium 135 - 145 mmol/L 139   142 R  140  140 R  143  145   Potassium 3.5 - 5.1 mmol/L 4.5   4.6 R  4.5  4.6 R  4.4  4.9   Chloride 98 - 111 mmol/L 104   107 R  106  108 R  112High  116High   CO2 22 - 32 mmol/L 24   24Abnormal R  24  23 R   21Low  22   Glucose, Bld 70 - 99 mg/dL 105High   73 R  93 CM  109 R, CM  80  96      Reviewed MRI brain from 2/18/202 IMPRESSION: 1. No acute intracranial abnormality. 2. Diffuse, advanced atrophy and findings of chronic small vessel disease  ROS Unable to complete due to advance dementia  Physical Exam: General: NAD, frail appearing, cooperative, sitting wheel chair feeding self dinner EYES: anicteric sclera, no discharge  ENMT: intact hearing, oral mucous membranes moist CV: no LE edema Pulmonary: no increased work of breathing, no cough, no audible wheezes, room air Abdomen:no ascites GU: deferred BNL:WHKNZ all extremities, no contractures, sitting upright Skin: warm and dry, no rashes or wounds on visible skin Neuro: Generalized weakness,otherwise non focal, coperative Psych: non-anxious affecttoday, A and O x 1 Hem/lymph/immuno: no widespread bruising  Thank you for the opportunity to participate in the care of Ms. Frith.  The palliative care team will continue to follow. Please call our office at [phone] if we can be of additional assistance.   Jari Favre, DNP, AGPCNP-BC

## 2020-05-18 ENCOUNTER — Telehealth: Payer: Self-pay

## 2020-05-18 NOTE — Telephone Encounter (Signed)
Received message to call Legrand Como at [facility] re: decline in patient. Per Legrand Como, patient has displayed decline as follows:   Increased weakness as evidenced by now w/c bound lost her ability to ambulate over the last 2 weeks. Patient has lost her ability to feed herself and requires assistance from facility caregiver. Patient is sleeping more and lethargic.  Legrand Como reported that patient has labored breathing with SPO2 @ 86 on room air.   Patient's son clarified goals of care to be comfort only and would like patient to be evaluated for hospice services.   Legrand Como requested oxygen for patient. Hospice referral center alerted to expect order for hospice eval and patient's need for oxygen. Palliative NP updated

## 2020-10-07 IMAGING — CT CT HEAD WITHOUT CONTRAST
4 series · 16 of 47 positions shown, 18 images · non-contrast
Comparison: Head CT 05/21/2018

CLINICAL DATA: Pt coming from home arrives via [facility]. Pt awoke this
morning needing to have a BM and got very weak in her legs upon
ambulating to the bathroom with walker. Pt BM was very dark. Pts
husband and son helped her to the ground. HX of d.*comment was
truncated*Head trauma, ataxia

EXAM:
CT HEAD WITHOUT CONTRAST
CT CERVICAL SPINE WITHOUT CONTRAST
TECHNIQUE: Multidetector CT imaging of the head and cervical spine was
performed following the standard protocol without intravenous
contrast. Multiplanar CT image reconstructions of the cervical spine
were also generated.

[Series 3: head without · axial · non-contrast · 0.41mm/px · z∈[+1218,+1338]mm · 7 of 33 slices shown, 9 images]
[im 5/33  brain]
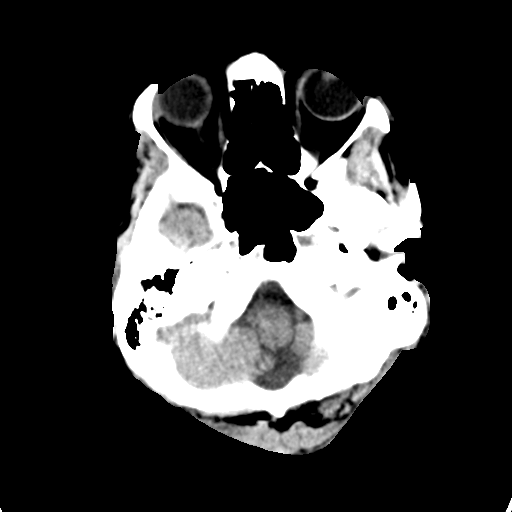
[im 5/33  bone]
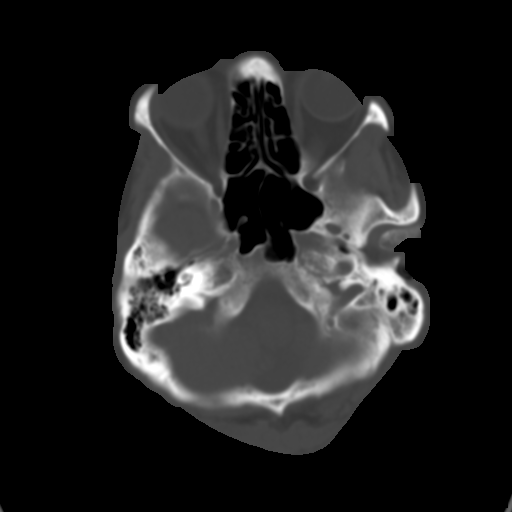
[im 9/33  brain]
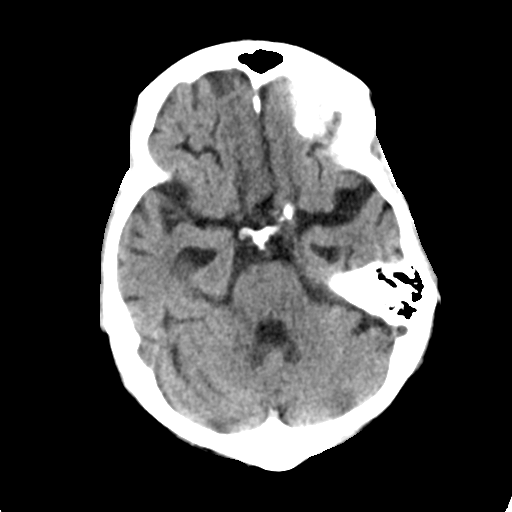
[im 13/33  brain]
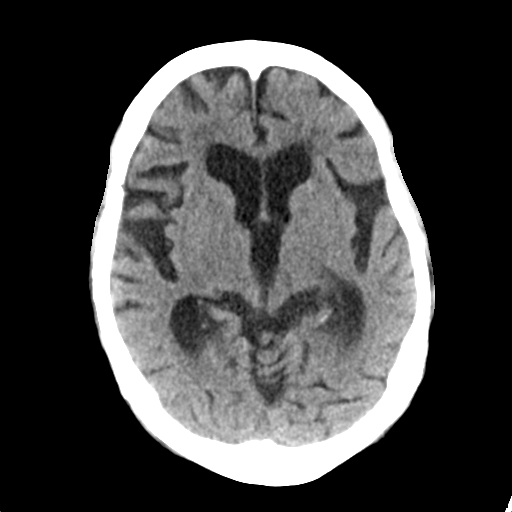
[im 17/33  brain]
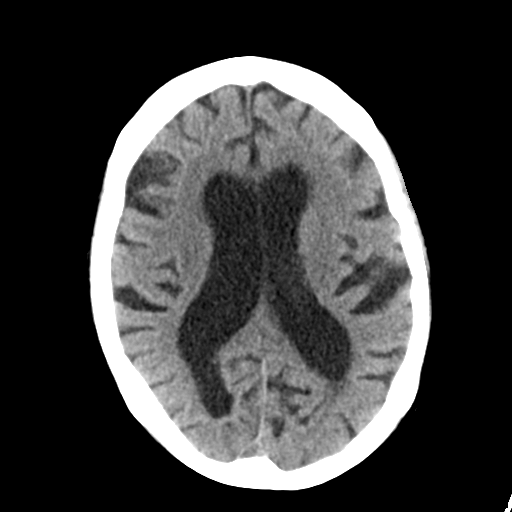
[im 21/33  brain]
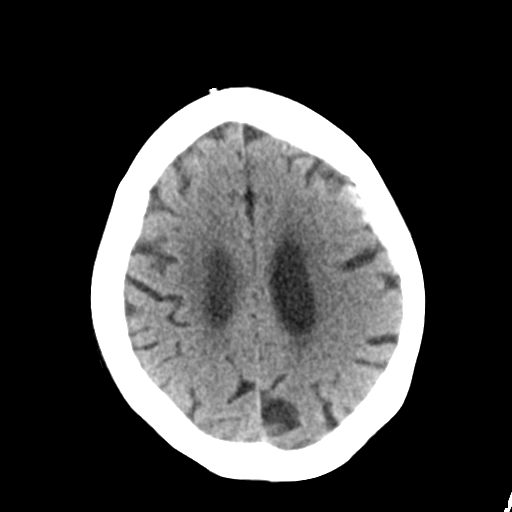
[im 21/33  bone]
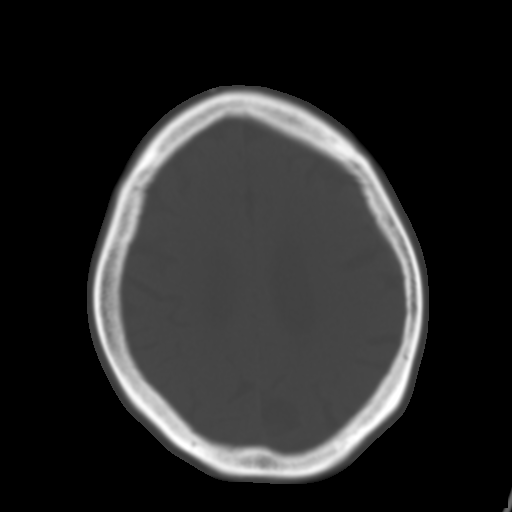
[im 25/33  brain]
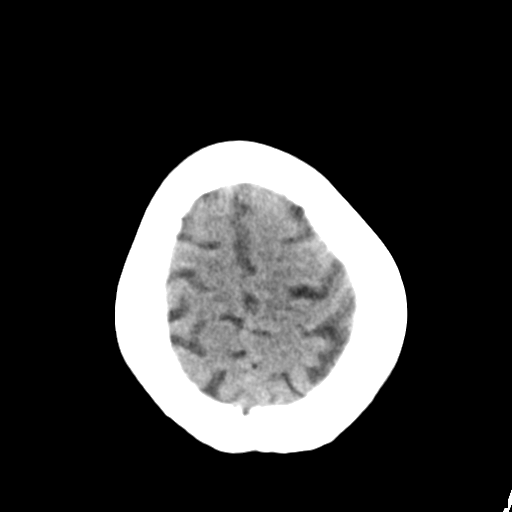
[im 29/33  brain]
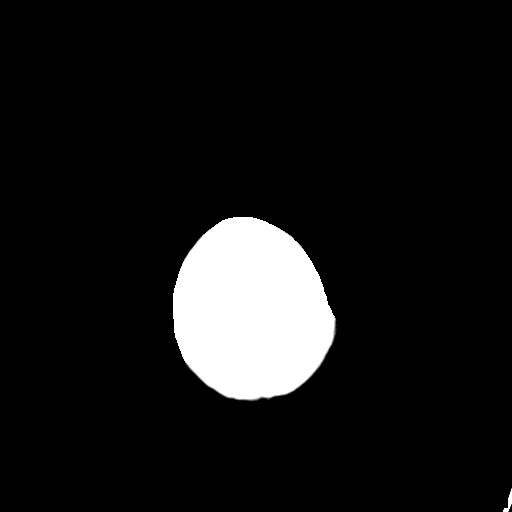

[Series 4: head bone · axial · 0.41mm/px · z∈[+1214,+1246]mm · 3 of 81 slices shown]
[im 9/81  bone]
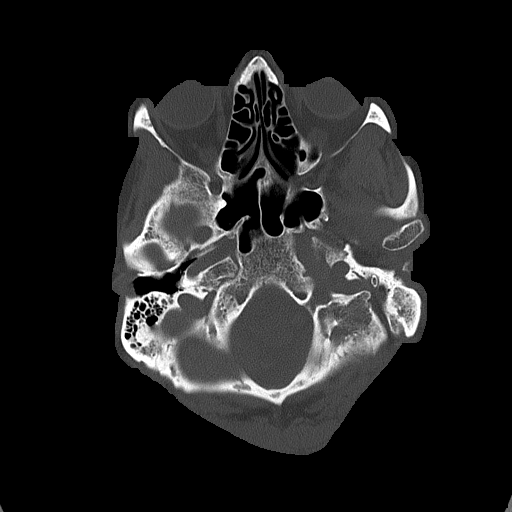
[im 17/81  bone]
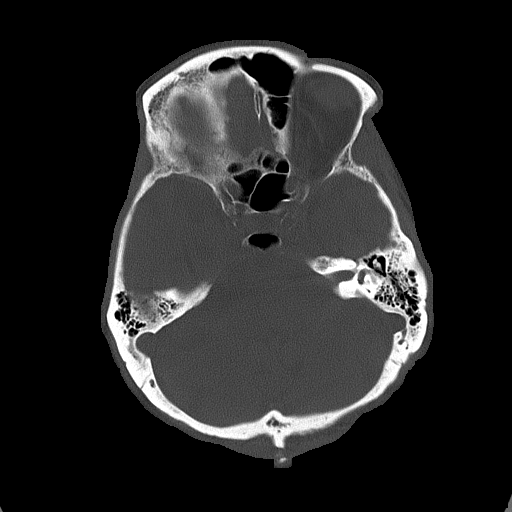
[im 25/81  bone]
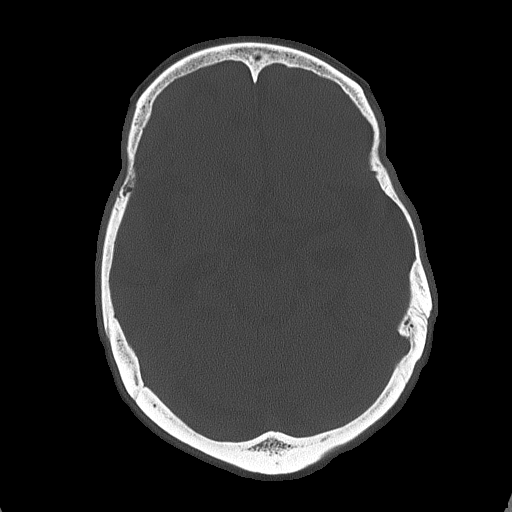

[Series 5: head without cor · coronal · non-contrast · 0.31mm/px · 3 of 66 slices shown]
[im 22/66  brain]
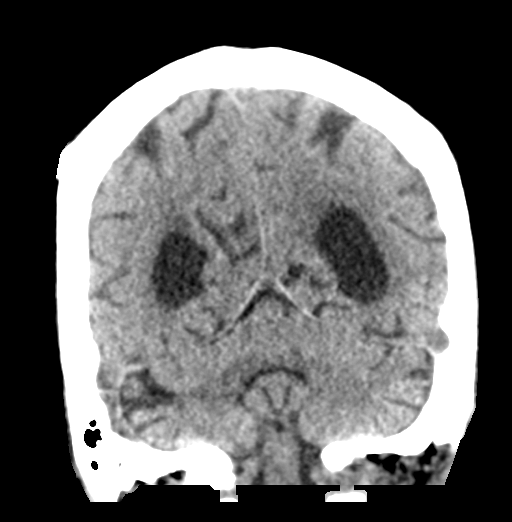
[im 29/66  brain]
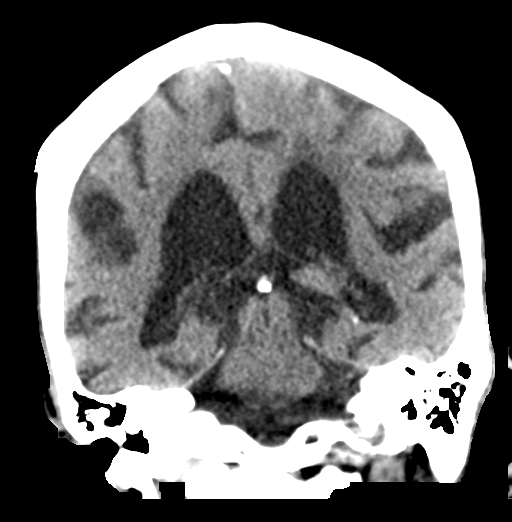
[im 37/66  brain]
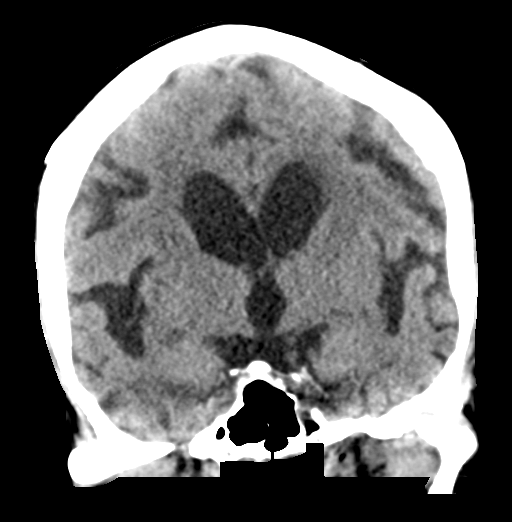

[Series 6: head without sag · sagittal · non-contrast · 0.31mm/px · 3 of 65 slices shown]
[im 22/65  brain]
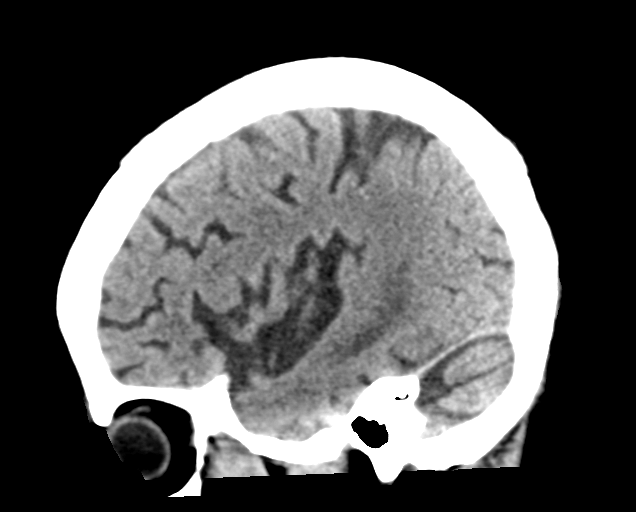
[im 33/65  brain]
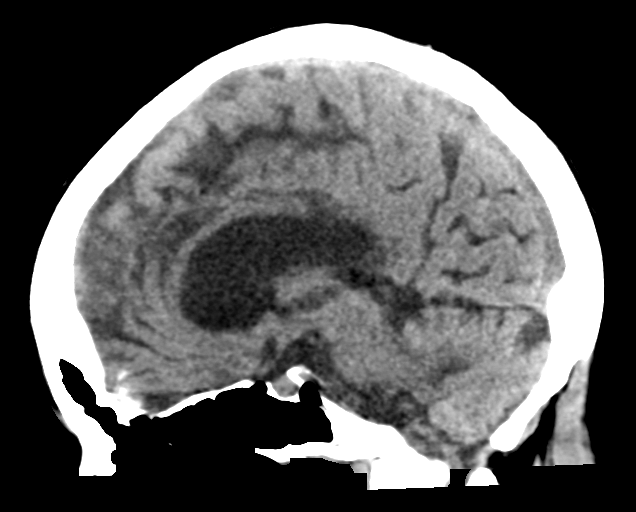
[im 43/65  brain]
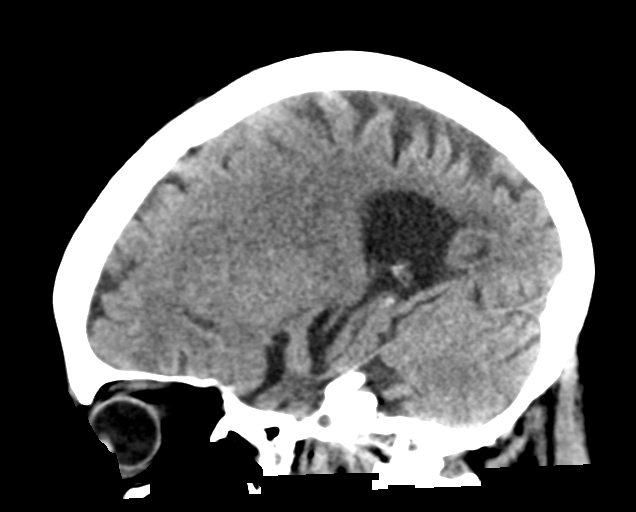

[16 of 47 positions shown; findings below may reference images not displayed]

FINDINGS: CT HEAD FINDINGS

Brain: No acute intracranial hemorrhage. No focal mass lesion. No CT
evidence of acute infarction. No midline shift or mass effect. No
hydrocephalus. Basilar cisterns are patent.

There are periventricular and subcortical white matter
hypodensities. Generalized cortical atrophy.

Vascular: No hyperdense vessel or unexpected calcification.

Skull: Normal. Negative for fracture or focal lesion.

Sinuses/Orbits: Paranasal sinuses and mastoid air cells are clear.
Orbits are clear.

Other: None.

CT CERVICAL SPINE FINDINGS

Alignment: Normal alignment of the cervical vertebral bodies.

Skull base and vertebrae: Normal craniocervical junction. No loss of
vertebral body height or disc height. Normal facet articulation. No
evidence of fracture.

Soft tissues and spinal canal: No prevertebral soft tissue swelling.
No perispinal or epidural hematoma.

Disc levels: Mild endplate spurring. Disc space narrowing at C5-C6.

Upper chest: Clear

Other: None
IMPRESSION: 1. No acute intracranial findings.
2. Atrophy and white matter microvascular disease.
3. No cervical spine fracture.
4. Moderate disc osteophytic disease.

## 2020-12-24 ENCOUNTER — Encounter: Payer: Medicare Other | Admitting: Family

## 2021-02-20 DEATH — deceased
